# Patient Record
Sex: Male | Born: 1960 | Race: White | Hispanic: No | Marital: Married | Smoking: Former smoker
Health system: Southern US, Community
[De-identification: ages and names within clinical notes are randomized; demographics above are authoritative.]

## PROBLEM LIST (undated history)

## (undated) DIAGNOSIS — K219 Gastro-esophageal reflux disease without esophagitis: Secondary | ICD-10-CM

## (undated) DIAGNOSIS — C159 Malignant neoplasm of esophagus, unspecified: Secondary | ICD-10-CM

## (undated) DIAGNOSIS — I10 Essential (primary) hypertension: Secondary | ICD-10-CM

## (undated) DIAGNOSIS — C801 Malignant (primary) neoplasm, unspecified: Secondary | ICD-10-CM

## (undated) DIAGNOSIS — T7840XA Allergy, unspecified, initial encounter: Secondary | ICD-10-CM

## (undated) HISTORY — DX: Gastro-esophageal reflux disease without esophagitis: K21.9

## (undated) HISTORY — DX: Malignant neoplasm of esophagus, unspecified: C15.9

## (undated) HISTORY — DX: Allergy, unspecified, initial encounter: T78.40XA

## (undated) HISTORY — PX: ESOPHAGEAL DILATION: SHX303

## (undated) NOTE — *Deleted (*Deleted)
Hematology/Oncology Consult note [facility]  [phone] [phone]  Patient Care Team: Emi Belfast, FNP as PCP - General (Nurse Practitioner) Benita Gutter, RN as Oncology Nurse Navigator Creig Hines, MD as Consulting Physician (Oncology)   Name of the patient: Jacob Henson  [id number]  1961-01-26   Date of visit: @TODAY @  Diagnosis- ***  Chief complaint/ Reason for visit- ***  Heme/Onc history: ***  Interval history- ***  ECOG PS- *** Pain scale- *** Opioid associated constipation- ***  Review of systems- ROS   Current treatment- ***  No Known Allergies   Past Medical History:  Diagnosis Date  . Allergy   . Cancer (HCC)   . Esophageal cancer (HCC)   . GERD (gastroesophageal reflux disease)    Has required esophageal dilation in past  . Hypertension      Past Surgical History:  Procedure Laterality Date  . ESOPHAGEAL DILATION    . PEG TUBE PLACEMENT  11/30/2019  . PORTA CATH INSERTION N/A 11/19/2019   Procedure: PORTA CATH INSERTION;  Surgeon: Annice Needy, MD;  Location: [facility] INVASIVE CV LAB;  Service: Cardiovascular;  Laterality: N/A;    Social History   Socioeconomic History  . Marital status: Married    Spouse name: Not on file  . Number of children: Not on file  . Years of education: Not on file  . Highest education level: Not on file  Occupational History  . Occupation: full time  Tobacco Use  . Smoking status: Former Smoker    Packs/day: 1.00    Years: 4.00    Pack years: 4.00    Types: Cigarettes    Quit date: 10/14/1978    Years since quitting: 41.2  . Smokeless tobacco: Never Used  Vaping Use  . Vaping Use: Never used  Substance and Sexual Activity  . Alcohol use: No  . Drug use: Never  . Sexual activity: Not Currently  Other Topics Concern  . Not on file  Social History Narrative  . Not on file   Social Determinants of Health   Financial Resource Strain:   .  Difficulty of Paying Living Expenses: Not on file  Food Insecurity:   . Worried About Programme researcher, broadcasting/film/video in the Last Year: Not on file  . Ran Out of Food in the Last Year: Not on file  Transportation Needs:   . Lack of Transportation (Medical): Not on file  . Lack of Transportation (Non-Medical): Not on file  Physical Activity:   . Days of Exercise per Week: Not on file  . Minutes of Exercise per Session: Not on file  Stress:   . Feeling of Stress : Not on file  Social Connections:   . Frequency of Communication with Friends and Family: Not on file  . Frequency of Social Gatherings with Friends and Family: Not on file  . Attends Religious Services: Not on file  . Active Member of Clubs or Organizations: Not on file  . Attends Banker Meetings: Not on file  . Marital Status: Not on file  Intimate Partner Violence:   . Fear of Current or Ex-Partner: Not on file  . Emotionally Abused: Not on file  . Physically Abused: Not on file  . Sexually Abused: Not on file    Family History  Problem Relation Age of Onset  . Heart disease Father   . Hearing loss Father   . Diabetes Father   . Heart disease  Paternal Grandmother   . Diabetes Paternal Grandmother   . Heart attack Paternal Grandfather      Current Facility-Administered Medications:  .  acetaminophen (TYLENOL) tablet 650 mg, 650 mg, Oral, Q6H PRN **OR** acetaminophen (TYLENOL) suppository 650 mg, 650 mg, Rectal, Q6H PRN, Mansy, Jan A, MD .  chlorhexidine (PERIDEX) 0.12 % solution 15 mL, 15 mL, Mouth Rinse, BID, Arnetha Courser, MD, 15 mL at 12/20/19 2236 .  Chlorhexidine Gluconate Cloth 2 % PADS 6 each, 6 each, Topical, Daily, Noralee Stain, DO, 6 each at 12/21/19 1524 .  ciprofloxacin (CIPRO) tablet 500 mg, 500 mg, Oral, BID, Rickard Patience, MD, 500 mg at 12/21/19 2050 .  enoxaparin (LOVENOX) injection 65 mg, 65 mg, Subcutaneous, Q24H, Mansy, Jan A, MD, 65 mg at 12/21/19 1019 .  feeding supplement (OSMOLITE 1.5 CAL)  liquid 1,000 mL, 1,000 mL, Per Tube, Continuous, Noralee Stain, DO, Last Rate: 80 mL/hr at 12/20/19 1530, 1,000 mL at 12/20/19 1530 .  feeding supplement (PROSource TF) liquid 45 mL, 45 mL, Per Tube, BID, Noralee Stain, DO, 45 mL at 12/21/19 2054 .  fentaNYL (DURAGESIC) 12 MCG/HR 1 patch, 1 patch, Transdermal, Q72H, Mansy, Jan A, MD .  free water 200 mL, 200 mL, Per Tube, Q4H, Noralee Stain, DO, 200 mL at 12/21/19 2104 .  [START ON 12/22/2019] influenza vac split quadrivalent PF (FLUARIX) injection 0.5 mL, 0.5 mL, Intramuscular, Tomorrow-1000, Choi, Jennifer, DO .  lidocaine-prilocaine (EMLA) cream 1 application, 1 application, Topical, Once, Mansy, Jan A, MD .  magnesium hydroxide (MILK OF MAGNESIA) suspension 30 mL, 30 mL, Oral, Daily PRN, Mansy, Jan A, MD .  MEDLINE mouth rinse, 15 mL, Mouth Rinse, q12n4p, Amin, Sumayya, MD, 15 mL at 12/21/19 1200 .  metoCLOPramide (REGLAN) 5 MG/5ML solution 5 mg, 5 mg, Oral, TID AC, Belue, Lendon Collar, RPH, 5 mg at 12/21/19 1334 .  morphine 2 MG/ML injection 2 mg, 2 mg, Intravenous, Q4H PRN, Mansy, Jan A, MD .  OLANZapine (ZYPREXA) tablet 10 mg, 10 mg, Oral, QHS, Mansy, Jan A, MD, 10 mg at 12/21/19 2051 .  ondansetron (ZOFRAN) tablet 4 mg, 4 mg, Oral, Q6H PRN **OR** ondansetron (ZOFRAN) injection 4 mg, 4 mg, Intravenous, Q6H PRN, Mansy, Jan A, MD .  ondansetron (ZOFRAN-ODT) disintegrating tablet 8 mg, 8 mg, Oral, Q8H PRN, Mansy, Jan A, MD .  oxyCODONE (ROXICODONE) 5 MG/5ML solution 5 mg, 5 mg, Oral, Q4H PRN, Mansy, Jan A, MD, 5 mg at 12/21/19 2049 .  pantoprazole (PROTONIX) EC tablet 40 mg, 40 mg, Oral, BID, Mansy, Jan A, MD, 40 mg at 12/21/19 2050 .  sennosides (SENOKOT) 8.8 MG/5ML syrup 10 mL, 10 mL, Oral, QHS, Mansy, Jan A, MD, 10 mL at 12/20/19 2236 .  traZODone (DESYREL) tablet 25 mg, 25 mg, Oral, QHS PRN, Mansy, Jan A, MD  Physical exam:  Vitals:   12/21/19 0936 12/21/19 1231 12/21/19 1627 12/21/19 2001  BP: 121/66 (!) 144/81 117/63 131/73  Pulse: 93  90 79 (!) 108  Resp: 16 17 17 20   Temp: 97.9 F (36.6 C) 97.6 F (36.4 C) 98.4 F (36.9 C) 98 F (36.7 C)  TempSrc: Oral Oral Oral Oral  SpO2: 99% 97% 95% 93%  Weight:      Height:       Physical Exam   CMP Latest Ref Rng & Units 12/21/2019  Glucose 70 - 99 mg/dL 161(W)  BUN 6 - 20 mg/dL 96(E)  Creatinine 4.54 - 1.24 mg/dL 0.98  Sodium 119 - 147 mmol/L 133(L)  Potassium 3.5 - 5.1 mmol/L 3.7  Chloride 98 - 111 mmol/L 97(L)  CO2 22 - 32 mmol/L 27  Calcium 8.9 - 10.3 mg/dL 9.8  Total Protein 6.5 - 8.1 g/dL -  Total Bilirubin 0.3 - 1.2 mg/dL -  Alkaline Phos 38 - 956 U/L -  AST 15 - 41 U/L -  ALT 0 - 44 U/L -   CBC Latest Ref Rng & Units 12/21/2019  WBC 4.0 - 10.5 K/uL 2.5(L)  Hemoglobin 13.0 - 17.0 g/dL 2.1(H)  Hematocrit 39 - 52 % 29.8(L)  Platelets 150 - 400 K/uL 175    @IMAGES @  MR BRAIN W WO CONTRAST  Result Date: 12/20/2019 CLINICAL DATA:  Initial evaluation for intractable nausea, history of stage IV esophageal cancer. Evaluate for metastatic disease. EXAM: MRI HEAD WITHOUT AND WITH CONTRAST TECHNIQUE: Multiplanar, multiecho pulse sequences of the brain and surrounding structures were obtained without and with intravenous contrast. CONTRAST:  10mL GADAVIST GADOBUTROL 1 MMOL/ML IV SOLN COMPARISON:  None available. FINDINGS: Brain: Cerebral volume within normal limits for age. Few scattered foci of subcentimeter T2/FLAIR hyperintensity noted within the periventricular deep white matter both cerebral hemispheres, nonspecific, but most like related chronic microvascular ischemic disease, mild for age. Superimposed remote lacunar infarcts noted within the left thalamus and basal ganglia. No abnormal foci of restricted diffusion to suggest acute or subacute ischemia. Gray-white matter differentiation maintained. No encephalomalacia to suggest chronic cortical infarction elsewhere within the brain. No acute intracranial hemorrhage. Few punctate chronic micro hemorrhages noted within  the cerebellum, brainstem, and about the deep gray nuclei, most likely related to chronic underlying hypertension. No mass lesion, midline shift or mass effect. No abnormal enhancement or evidence for intracranial metastatic disease. Ventricles normal size without hydrocephalus. No extra-axial fluid collection. Pituitary gland suprasellar region within normal limits. Midline structures intact. Vascular: Major intracranial vascular flow voids are maintained. Skull and upper cervical spine: Craniocervical junction within normal limits. Bone marrow signal intensity normal. No visible focal marrow replacing lesion. No scalp soft tissue abnormality. Sinuses/Orbits: Globes orbital soft tissues within normal limits. Paranasal sinuses are largely clear. No mastoid effusion. Inner ear structures within normal limits. Other: None. IMPRESSION: 1. No acute intracranial abnormality or evidence for intracranial metastatic disease. 2. Mild chronic microvascular ischemic disease with superimposed remote lacunar infarcts involving the left thalamus and basal ganglia. 3. Few punctate chronic micro hemorrhages involving the cerebellum, brainstem, and deep gray nuclei, most likely related to chronic underlying hypertension. Electronically Signed   By: Rise Mu M.D.   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 EXAM: NUCLEAR MEDICINE PET SKULL BASE TO THIGH TECHNIQUE: 15.4 mCi F-18 FDG was injected intravenously. Full-ring PET imaging was performed from the skull base to thigh after the radiotracer. CT data was obtained and used for attenuation correction and anatomic localization. Fasting blood glucose: 121 mg/dl COMPARISON:  CT abdomen/pelvis dated 10/21/2019. MRI right shoulder dated 10/16/2019. FINDINGS: Mediastinal blood pool activity: SUV max 3.7  Liver activity: SUV max NA NECK: No hypermetabolic cervical lymphadenopathy. Incidental CT findings: none CHEST: Mass involving the lower 3rd of the esophagus (series 3/image 123), max SUV 15.0, corresponding to the patient's known primary esophageal neoplasm. 10 mm anterior left upper lobe nodule (series 3/image 85), max SUV 4.0, compatible with pulmonary metastasis. No hypermetabolic thoracic lymphadenopathy. Right chest port terminates in the lower SVC. Incidental CT  findings: Mild atherosclerotic calcifications of the aortic arch. ABDOMEN/PELVIS: 3.0 cm right adrenal metastasis (series 3/image 152), max SUV 9.0. 8 mm short axis celiac axis node (series 3/image 149), max SUV 6.4. Incidental CT findings: Layering gallbladder sludge versus noncalcified gallstones. Mild atherosclerotic calcifications of the abdominal aorta and branch vessels. SKELETON: Widespread osseous metastases throughout the visualized axial and appendicular skeleton, including: --Destructive right scapular mass, max SUV 7.7 --Right sternum, max SUV 7.4 --Right 5th costosternal junction, max SUV 7.1 --T12 vertebral body, max SUV 12.4 --Destructive left iliac bone mass, max SUV 11.4 --Right proximal femoral shaft, max SUV 5.4 Incidental CT findings: Degenerative changes of the visualized thoracolumbar spine. IMPRESSION: Distal esophageal mass, corresponding to the patient's known primary esophageal neoplasm. 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 Widespread osseous metastases, including destructive lesions in the right scapula and left iliac bone, as above. Electronically Signed   By: Charline Bills M.D.   On: 11/23/2019 11:43   DG Chest Portable 1 View  Result Date: 12/19/2019 CLINICAL DATA:  Weakness. EXAM: PORTABLE CHEST 1 VIEW COMPARISON:  October 10, 2018.  PET-CT dated 11/23/2019 FINDINGS: There is a well-positioned right-sided Port-A-Cath. Again noted is a lytic lesion involving  the right glenoid. There is no pneumothorax. No large pleural effusion. There is some atelectasis at the lung bases. There is a lytic lesion involving the posterior eighth rib on the right. The heart size is unremarkable. IMPRESSION: 1. No acute cardiopulmonary process. 2. Well-positioned right-sided Port-A-Cath. 3. Lytic lesion involving the right glenoid and right eighth rib. Electronically Signed   By: Katherine Mantle M.D.   On: 12/19/2019 02:37   DG Abd 2 Views  Result Date: 12/19/2019 CLINICAL DATA:  Generalized weakness EXAM: ABDOMEN - 2 VIEW COMPARISON:  None. FINDINGS: The bowel gas pattern is normal. Moderate amount of right colonic stool is present. There is no evidence of free air. No radio-opaque calculi or other significant radiographic abnormality is seen. IMPRESSION: Nonobstructive bowel gas pattern. Electronically Signed   By: Jonna Clark M.D.   On: 12/19/2019 16:30     Assessment and plan- Patient is a 52 y.o. male ***   Visit Diagnosis 1. Weakness   2. Neutropenia, unspecified type (HCC)   3. AKI (acute kidney injury) (HCC)   4. Nausea with vomiting      Dr. Owens Shark, MD, MPH [facility] at [facility] [phone] 12/21/2019 10:33 PM

---

## 1998-07-15 ENCOUNTER — Ambulatory Visit (HOSPITAL_COMMUNITY): Admission: RE | Admit: 1998-07-15 | Discharge: 1998-07-15 | Payer: Self-pay | Admitting: Gastroenterology

## 2003-04-11 ENCOUNTER — Encounter: Admission: RE | Admit: 2003-04-11 | Discharge: 2003-04-11 | Payer: Self-pay | Admitting: Gastroenterology

## 2003-04-11 IMAGING — RF DG ESOPHAGUS
11 of 14 series · 17 of 24 positions shown · non-contrast
Comparison: none

CLINICAL DATA: Dysphagia.
 BARIUM SWALLOW
 Initially, double contrast barium swallow was performed.  The mucosa of the esophagus appears normal.  A single contrast study was then performed.  The neuromuscular swallowing mechanism appears normal.  Esophageal peristalsis is normal. There is a sliding hiatal hernia present.  In the LPO position, there is moderate gastroesophageal reflux noted.  A barium pill was given at the end of the study which passed into the stomach without delay.
 IMPRESSION
 Small sliding hiatal hernia with free gastroesophageal reflux.   Barium pill passes into stomach without delay.

[Series 1: run · 1 of 1 slices shown (1 of 11)]
[im 1/1]
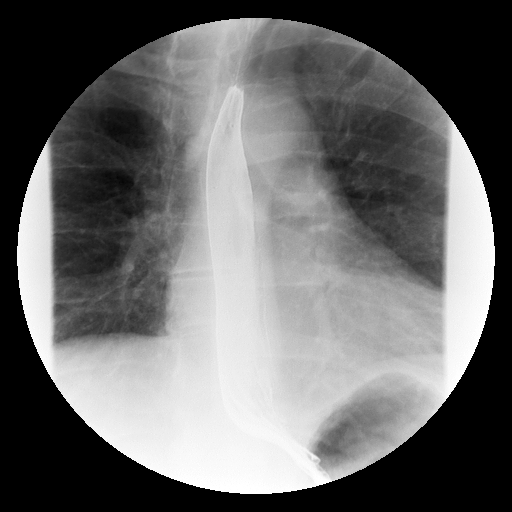

[Series 6: run · 1 of 1 slices shown (2 of 11)]
[im 1/1]
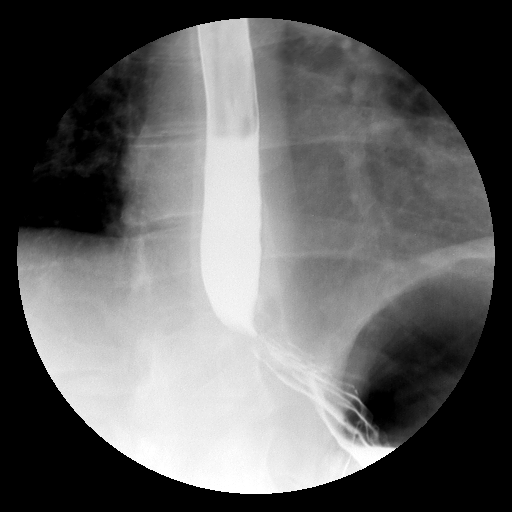

[Series 7: run · 1 of 1 slices shown (3 of 11)]
[im 1/1]
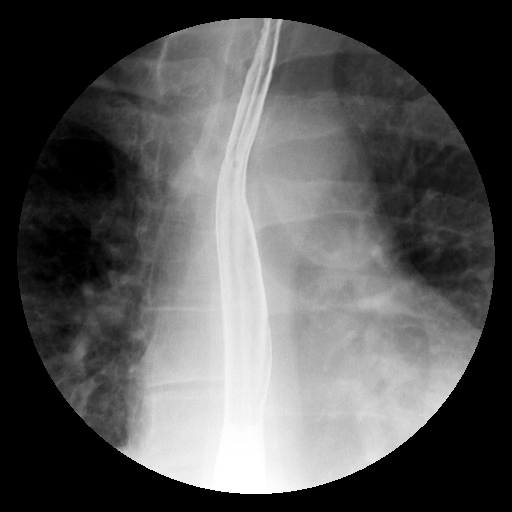

[Series 8: run · 1 of 1 slices shown (4 of 11)]
[im 1/1]
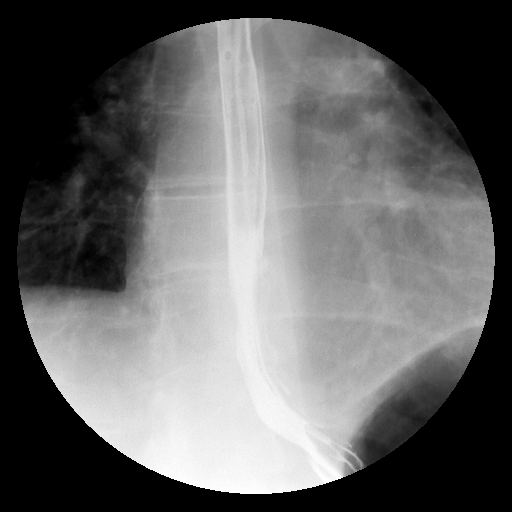

[Series 9: run · 3 of 6 slices shown (5 of 11)]
[im 2/6]
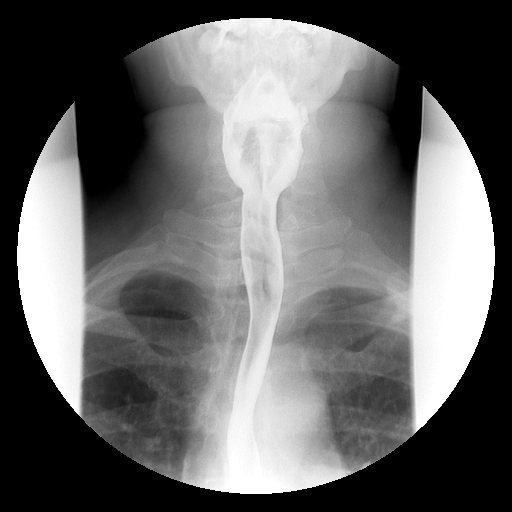
[im 3/6]
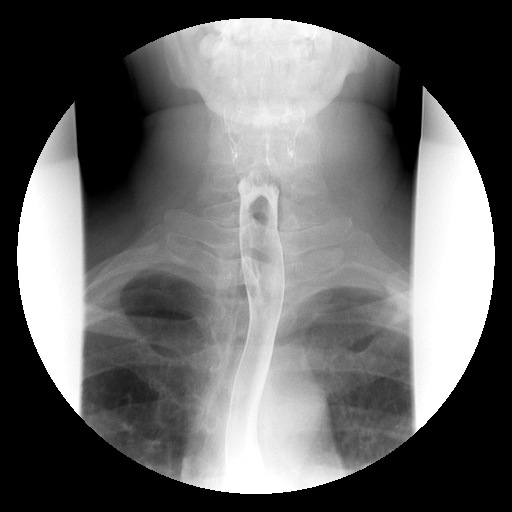
[im 6/6]
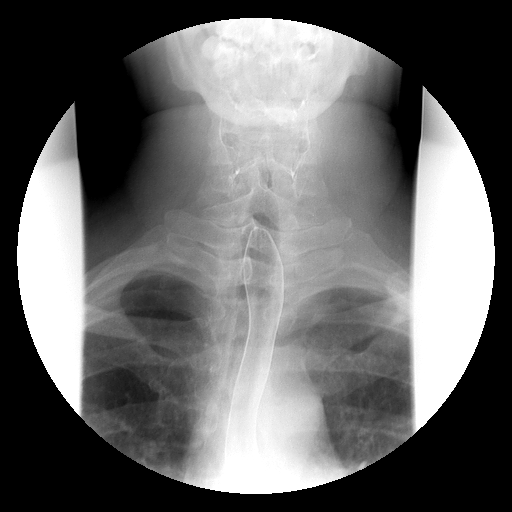

[Series 10: run · 3 of 4 slices shown (6 of 11)]
[im 1/4]
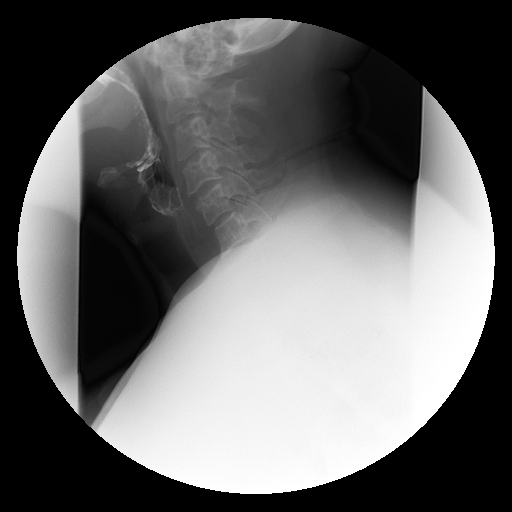
[im 3/4]
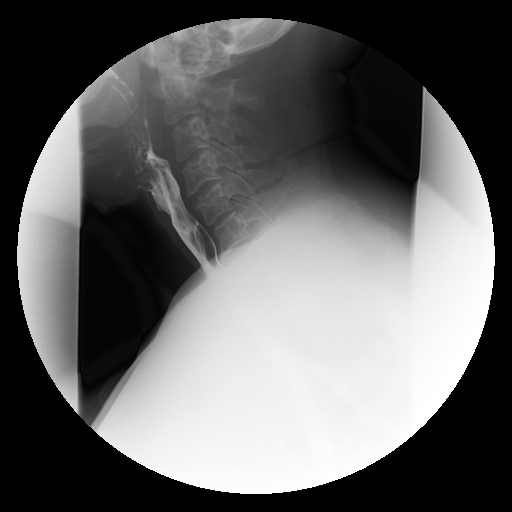
[im 4/4]
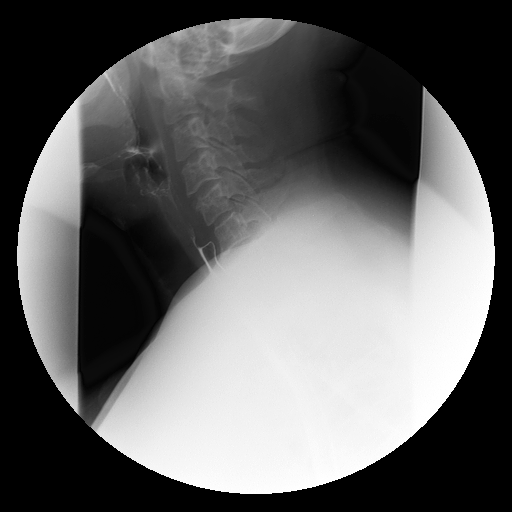

[Series 11: run · 3 of 4 slices shown (7 of 11)]
[im 1/4]
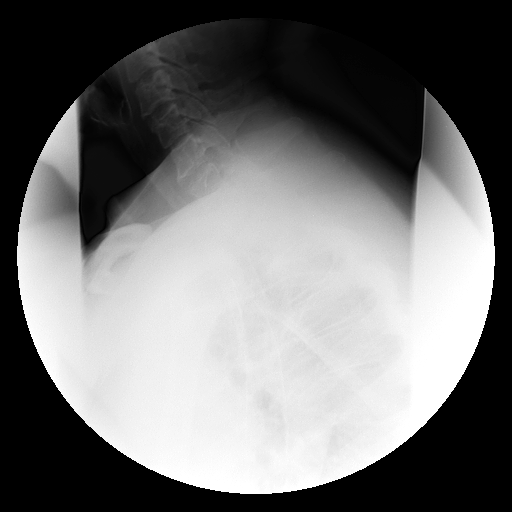
[im 3/4]
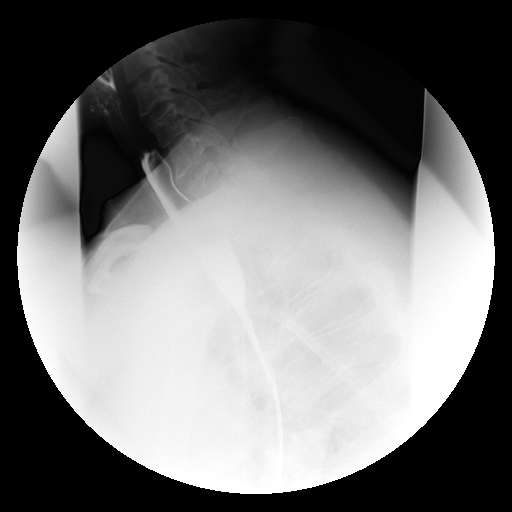
[im 4/4]
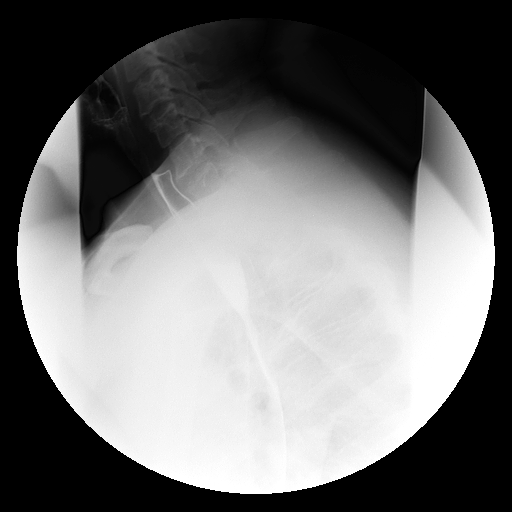

[Series 13: run · 1 of 1 slices shown (8 of 11)]
[im 1/1]
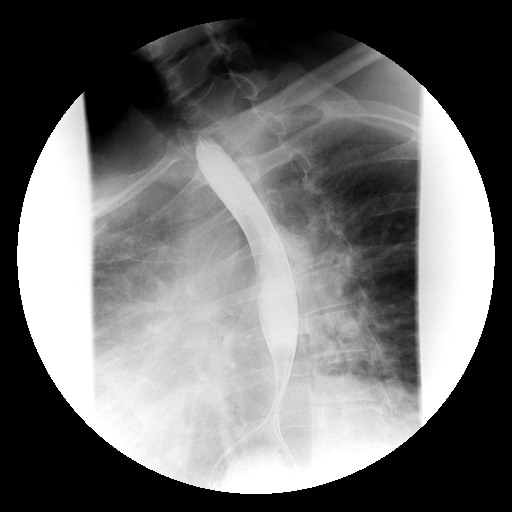

[Series 14: run · 1 of 1 slices shown (9 of 11)]
[im 1/1]
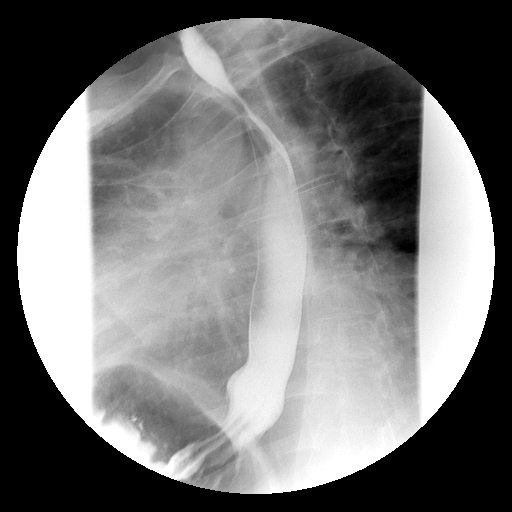

[Series 15: run · 1 of 1 slices shown (10 of 11)]
[im 1/1]
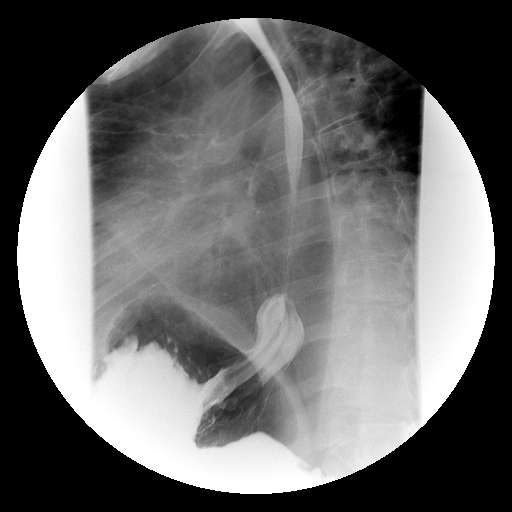

[Series 18: run · 1 of 1 slices shown (11 of 11)]
[im 1/1]
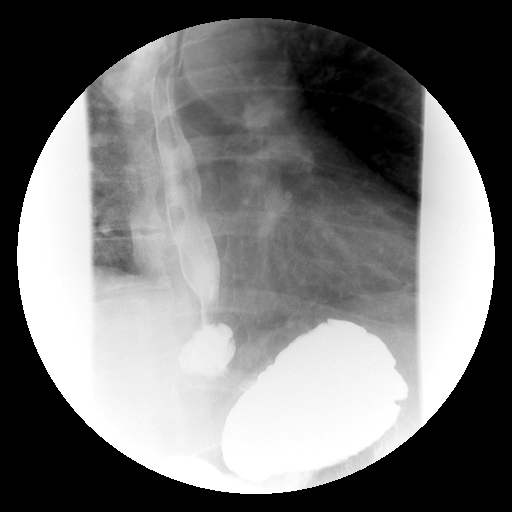

[17 of 24 positions shown; findings below may reference images not displayed]

## 2011-07-22 ENCOUNTER — Emergency Department (HOSPITAL_COMMUNITY)
Admission: EM | Admit: 2011-07-22 | Discharge: 2011-07-22 | Disposition: A | Discharge status: Home Health | Payer: 59 | Attending: Emergency Medicine | Admitting: Emergency Medicine

## 2011-07-22 ENCOUNTER — Encounter (HOSPITAL_COMMUNITY): Payer: Self-pay | Admitting: Emergency Medicine

## 2011-07-22 DIAGNOSIS — R51 Headache: Secondary | ICD-10-CM | POA: Insufficient documentation

## 2011-07-22 DIAGNOSIS — I10 Essential (primary) hypertension: Secondary | ICD-10-CM | POA: Insufficient documentation

## 2011-07-22 LAB — BASIC METABOLIC PANEL
BUN: 13 mg/dL (ref 6–23)
CO2: 25 mEq/L (ref 19–32)
Calcium: 9.3 mg/dL (ref 8.4–10.5)
Chloride: 102 mEq/L (ref 96–112)
Creatinine, Ser: 1.1 mg/dL (ref 0.50–1.35)
GFR calc Af Amer: 88 mL/min — ABNORMAL LOW (ref >90)
GFR calc non Af Amer: 76 mL/min — ABNORMAL LOW (ref >90)
Glucose, Bld: 105 mg/dL — ABNORMAL HIGH (ref 70–99)
Potassium: 4.8 mEq/L (ref 3.5–5.1)
Sodium: 137 mEq/L (ref 135–145)

## 2011-07-22 LAB — DIFFERENTIAL
Basophils Absolute: 0 10*3/uL (ref 0.0–0.1)
Basophils Relative: 0 % (ref 0–1)
Eosinophils Absolute: 0.4 10*3/uL (ref 0.0–0.7)
Eosinophils Relative: 4 % (ref 0–5)
Lymphocytes Relative: 20 % (ref 12–46)
Lymphs Abs: 2 10*3/uL (ref 0.7–4.0)
Monocytes Absolute: 1 10*3/uL (ref 0.1–1.0)
Monocytes Relative: 10 % (ref 3–12)
Neutro Abs: 6.9 10*3/uL (ref 1.7–7.7)
Neutrophils Relative %: 67 % (ref 43–77)

## 2011-07-22 LAB — CBC
HCT: 47.3 % (ref 39.0–52.0)
Hemoglobin: 16.3 g/dL (ref 13.0–17.0)
MCH: 32.3 pg (ref 26.0–34.0)
MCHC: 34.5 g/dL (ref 30.0–36.0)
MCV: 93.8 fL (ref 78.0–100.0)
Platelets: 149 10*3/uL — ABNORMAL LOW (ref 150–400)
RBC: 5.04 MIL/uL (ref 4.22–5.81)
RDW: 13.1 % (ref 11.5–15.5)
WBC: 10.3 10*3/uL (ref 4.0–10.5)

## 2011-07-22 LAB — TROPONIN I
Troponin I: <0.3 ng/mL (ref <0.30)
Troponin I: <0.3 ng/mL (ref <0.30)

## 2011-07-22 MED ORDER — IBUPROFEN 800 MG PO TABS
800.0000 mg | ORAL_TABLET | Freq: Once | ORAL | Status: AC
  Administered 2011-07-22: 800 mg via ORAL
  Filled 2011-07-22: qty 1

## 2011-07-22 MED ORDER — HYDROCHLOROTHIAZIDE 25 MG PO TABS: 25.0000 mg | ORAL_TABLET | Freq: Every day | ORAL | Status: DC

## 2011-07-22 MED ORDER — ASPIRIN 81 MG PO CHEW
324.0000 mg | CHEWABLE_TABLET | Freq: Once | ORAL | Status: AC
  Administered 2011-07-22: 324 mg via ORAL
  Filled 2011-07-22: qty 4

## 2011-07-22 MED ORDER — GI COCKTAIL ~~LOC~~
30.0000 mL | Freq: Once | ORAL | Status: AC
  Administered 2011-07-22: 30 mL via ORAL
  Filled 2011-07-22: qty 30

## 2011-07-22 NOTE — Discharge Instructions (Signed)
You were seen and evaluated today for your symptoms of elevated blood pressure. Your EKG of your heart was abnormal but did not show any signs for heart attack or other concerning condition.Your lab testing today was normal without any signs for concerning cause of your blood pressure. There were also no signs for heart damage or heart attack on your lab testing today. At this time your providers recommend use of blood pressure medication for your blood pressure. Continue to monitor your blood pressure daily at the same time during the day. Please followup with a primary care provider for continued evaluation and treatment of your medical conditions elevated blood pressure.   Hypertension Information As your heart beats, it forces blood through your arteries. This force is your blood pressure. If the pressure is too high, it is called hypertension (HTN) or high blood pressure. HTN is dangerous because you may have it and not know it. High blood pressure may mean that your heart has to work harder to pump blood. Your arteries may be narrow or stiff. The extra work puts you at risk for heart disease, stroke, and other problems.  Blood pressure consists of two numbers, a higher number over a lower, 110/72, for example. It is stated as "110 over 72." The ideal is below 120 for the top number (systolic) and under 80 for the bottom (diastolic).  You should pay close attention to your blood pressure if you have certain conditions such as:  Heart failure.   Prior heart attack.   Diabetes   Chronic kidney disease.   Prior stroke.   Multiple risk factors for heart disease.  To see if you have HTN, your blood pressure should be measured while you are seated with your arm held at the level of the heart. It should be measured at least twice. A one-time elevated blood pressure reading (especially in the Emergency Department) does not mean that you need treatment. There may be conditions in which the blood  pressure is different between your right and left arms. It is important to see your caregiver soon for a recheck. Most people have essential hypertension which means that there is not a specific cause. This type of high blood pressure may be lowered by changing lifestyle factors such as:  Stress.   Smoking.   Lack of exercise.   Excessive weight.   Drug/tobacco/alcohol use.   Eating less salt.  Most people do not have symptoms from high blood pressure until it has caused damage to the body. Effective treatment can often prevent, delay or reduce that damage. TREATMENT  Treatment for high blood pressure, when a cause has been identified, is directed at the cause. There are a large number of medications to treat HTN. These fall into several categories, and your caregiver will help you select the medicines that are best for you. Medications may have side effects. You should review side effects with your caregiver. If your blood pressure stays high after you have made lifestyle changes or started on medicines,   Your medication(s) may need to be changed.   Other problems may need to be addressed.   Be certain you understand your prescriptions, and know how and when to take your medicine.   Be sure to follow up with your caregiver within the time frame advised (usually within two weeks) to have your blood pressure rechecked and to review your medications.   If you are taking more than one medicine to lower your blood pressure, make sure  you know how and at what times they should be taken. Taking two medicines at the same time can result in blood pressure that is too low.  Document Released: 04/02/2005 Document Revised: 10/10/2010 Document Reviewed: 04/09/2007 [facility] Patient Information 2012 [place], [place].    RESOURCE GUIDE  Chronic Pain Problems: Contact [facility]  [phone] Patients need to be referred by their primary care doctor.  Insufficient Money for  Medicine: Contact United Way:  call "211" or [facility] [phone].  No Primary Care Doctor: - Call [facility]  [phone] - can help you locate a primary care doctor that  accepts your insurance, provides certain services, etc. - Physician Referral Service- [phone]  Agencies that provide inexpensive medical care: - [facility]  [phone] - [facility]  [phone] - [facility]  [phone] - Women's Clinic  [phone] - [facility]  [phone] [facility]  [phone]  Medicaid-accepting [facility] Providers: - [facility]- [address]  [phone], Mon-Fri 9am-7pm, Sat 9am-1pm - [facility]- [address] [place], [address] [place]  [phone] - [facility]- [address], [address] [place]  [phone] [facility]- [address]  [phone] - Renaye Rakers- [address] [place], [address], [phone]  Only accepts [place] patients after they have their name  applied to their card  Self Pay (no insurance) in [place]: - Sickle Cell Patients: Dr Willey Blade, [facility]  [address] [place], [phone] - [facility]- [address] [place]  [phone]       [facility]- [address]       -     [facility]- see information above (Speak to [employer] if you do not have insurance)       -  [facility]- [address] [place], [phone]       -  [facility]- [address],  [phone]       -  [facility]- [address], [phone]       -  Dr Julio Sicks-  [address], [place], [phone]       -  [facility]- [address] [phone]       -  [facility]- [address], [phone], also [address], [phone]       -    [facility]- [address]  [place], [phone], 1st & 3rd Saturday   every month, 10am-1pm  1) Find a Doctor and Pay Out of Pocket Although you won't have to find out who is covered by your insurance plan, it is a good idea to ask around and get recommendations. You will then need to call the office and see if the doctor you have chosen will accept you as a new patient and what types of options they offer for patients who are self-pay. Some doctors offer discounts or will set up payment plans for their patients who do not have insurance, but you will need to ask so you aren't surprised when you get to your appointment.  2) Contact Your Local Health Department Not all health departments have doctors that can see patients for sick visits, but many do, so  it is worth a call to see if yours does. If you don't know where your local health department is, you can check in your phone book. The CDC also has a tool to help you locate your state's health department, and many state websites also have listings of all of their local health departments.  3) Find a Walk-in Clinic If your illness is not likely to be very severe or complicated, you may want to try a walk in clinic. These are popping up all over the country in pharmacies, drugstores, and shopping centers. They're usually staffed by nurse practitioners or physician assistants that have been trained to treat common illnesses and complaints. They're usually fairly quick and inexpensive. However, if you have serious medical issues or chronic medical problems, these are probably not your best option  STD Testing - [facility] Department of [facility], STD Clinic, [address] [place], phone [phone] or [phone].  Monday - Friday, call for an appointment. [facility] Department of [employer], STD Clinic, [address], [place], phone [phone] or [phone].  Monday - Friday, call for an appointment.  Abuse/Neglect: [facility] Child Abuse Hotline [phone] [facility] Child Abuse Hotline 2392200810 (After Hours)  Emergency Shelter:  [facility] ([phone]  Maternity Homes: - [facility] [place] [facility] [phone] - [facility] [phone]  MRSA Hotline #:   2690938092  [facility] Resources  [facility] [place]  [employer] [facility] Dept. [address]                 [address]         [address] [place] [address]  Blondell Reveal Phone:  [phone]                                  Phone:  [phone]                   Phone:  [phone]  [facility] Mental [facility], [phone] - [phone]       -     [facility] in [place], [address],                                  [phone], [facility] Child Abuse Hotline 413-530-1188 or 9297324105 (After Hours)   Behavioral Health Services  Substance Abuse Resources: - [facility]  ([phone] - [facility] [phone] - [facility] [place] [phone] Floydene Flock ([phone] - Residential & Outpatient Substance Abuse Program  [phone]  Psychological Services: [facility]  ([phone] Services  [phone] - [facility], [phone] [place]. [address], [place], ACCESS LINE: 320-851-8470 or 346-043-4979, [employer]  Dental Assistance  If unable to pay or uninsured, contact:  [facility] or [facility]. to become qualified for the adult dental clinic.  Patients with Medicaid: [facility] [phone] [address], [phone], [phone]  If unable to pay, or uninsured, contact [facility]  [phone]) or [facility] Department [phone] in [place], [phone] in [facility]) to become qualified for the adult dental clinic  Other Low-Cost Community Dental Services: - [facility]- [address] [place], [place], [place], [address], [phone], [phone], 2nd and 4th Thursday of the month at 6:30am.  10 clients each day by appointment, can sometimes see walk-in patients if someone does not show for an appointment. [facility]- [address] [place]ins [place], [place], [address], [phone] - [facility]- [address] [place], [place], [address], [phone] - [facility]- [phone] [facility] Health Department- [phone] [facility] Department- [phone]

## 2011-07-22 NOTE — ED Notes (Signed)
Patient sts that since Friday of last week he has been experiencing headaches and indigestion. Patients wife checked blood pressure with wrist cuff over weekend and sts that his highest bp was 177/117 and that his headache was worse when his blood pressure was up. Patient currently experiencing headache in back of his head pain 6/10. Patient on cardiac monitor, blood being drawn at this time by Dierdre, phlebotomist. PA Dammen has been in to see patient and EKG given to MD otter.

## 2011-07-22 NOTE — ED Notes (Signed)
Pt alert, nad, arrives from home, c/o high blood pressure, onset several days ago, states mild fullness in head, denies chest pain, resp even unlabored, skin pwd

## 2011-07-22 NOTE — ED Provider Notes (Signed)
Medical screening examination/treatment/procedure(s) were performed by non-physician practitioner and as supervising physician I was immediately available for consultation/collaboration.  Mendi Constable M Ozie Dimaria, MD 07/22/11 0620 

## 2011-07-22 NOTE — ED Provider Notes (Signed)
History     CSN: [id number]  Arrival date & time 07/22/11  0402   First MD Initiated Contact with Patient 07/22/11 0414      Chief Complaint  Patient presents with  . Hypertension    HPI  History provided by the patient and family. Patient is a 51 year old male with history of hiatal hernia who presents with complaints for concerns of elevated blood pressure. Patient then reports at a pressure has been elevated over last several days. Patient has been checking it regularly at home with a wrist blood pressure cuff. Patient noticed highest reading Friday night of 170/98. Patient states his pressure has been fluctuating over the past few days but has been saying elevated. Patient also reports having some mild headaches off and on for the past few days. Patient denies any aggravating or alleviating factors. Patient woke up this morning around 2 AM and reports that he felt some indigestion in his chest and stomach. Patient states this felt different than his acid reflux. It is described as a "greasy feeling" with some associated belching. Patient denies any other symptoms.    History reviewed. No pertinent past medical history.  History reviewed. No pertinent past surgical history.  No family history on file.  History  Substance Use Topics  . Smoking status: Never Smoker   . Smokeless tobacco: Not on file  . Alcohol Use: No      Review of Systems  Constitutional: Negative for fever, chills and diaphoresis.  Respiratory: Negative for shortness of breath.   Cardiovascular: Negative for chest pain, palpitations and leg swelling.  Gastrointestinal: Negative for nausea, vomiting and abdominal pain.  Genitourinary: Negative for difficulty urinating.  Neurological: Positive for headaches.    Allergies  Review of patient's allergies indicates no known allergies.  Home Medications   Current Outpatient Rx  Name Route Sig Dispense Refill  . RANITIDINE HCL 150 MG PO TABS Oral Take  150 mg by mouth 2 (two) times daily.      BP 166/97  Pulse 86  Temp 98 F (36.7 C)  Resp 16  Wt 290 lb (131.543 kg)  SpO2 99%  Physical Exam  Nursing note and vitals reviewed. Constitutional: He is oriented to person, place, and time. He appears well-developed and well-nourished. No distress.  HENT:  Head: Normocephalic.  Eyes: Conjunctivae and EOM are normal. Pupils are equal, round, and reactive to light.  Cardiovascular: Normal rate and regular rhythm.   No murmur heard. Pulmonary/Chest: Effort normal and breath sounds normal. He has no rales. He exhibits no tenderness.  Abdominal: Soft. There is tenderness in the epigastric area. There is no rebound and no guarding.       Mild pain  Musculoskeletal: Normal range of motion. He exhibits no edema and no tenderness.  Neurological: He is alert and oriented to person, place, and time. He has normal strength. No cranial nerve deficit or sensory deficit. Gait normal.  Skin: Skin is warm.  Psychiatric: He has a normal mood and affect. His behavior is normal.    ED Course  Procedures   Results for orders placed during the hospital encounter of 07/22/11  CBC      Component Value Range   WBC 10.3  4.0 - 10.5 (K/uL)   RBC 5.04  4.22 - 5.81 (MIL/uL)   Hemoglobin 16.3  13.0 - 17.0 (g/dL)   HCT 78.2  95.6 - 21.3 (%)   MCV 93.8  78.0 - 100.0 (fL)   MCH 32.3  26.0 -  34.0 (pg)   MCHC 34.5  30.0 - 36.0 (g/dL)   RDW 09.8  11.9 - 14.7 (%)   Platelets 149 (*) 150 - 400 (K/uL)  DIFFERENTIAL      Component Value Range   Neutrophils Relative 67  43 - 77 (%)   Neutro Abs 6.9  1.7 - 7.7 (K/uL)   Lymphocytes Relative 20  12 - 46 (%)   Lymphs Abs 2.0  0.7 - 4.0 (K/uL)   Monocytes Relative 10  3 - 12 (%)   Monocytes Absolute 1.0  0.1 - 1.0 (K/uL)   Eosinophils Relative 4  0 - 5 (%)   Eosinophils Absolute 0.4  0.0 - 0.7 (K/uL)   Basophils Relative 0  0 - 1 (%)   Basophils Absolute 0.0  0.0 - 0.1 (K/uL)  BASIC METABOLIC PANEL       Component Value Range   Sodium 137  135 - 145 (mEq/L)   Potassium 4.8  3.5 - 5.1 (mEq/L)   Chloride 102  96 - 112 (mEq/L)   CO2 25  19 - 32 (mEq/L)   Glucose, Bld 105 (*) 70 - 99 (mg/dL)   BUN 13  6 - 23 (mg/dL)   Creatinine, Ser 8.29  0.50 - 1.35 (mg/dL)   Calcium 9.3  8.4 - 56.2 (mg/dL)   GFR calc non Af Amer 76 (*) >90 (mL/min)   GFR calc Af Amer 88 (*) >90 (mL/min)  TROPONIN I      Component Value Range   Troponin I <0.30  <0.30 (ng/mL)        1. Hypertension       MDM  4:25 AM patient seen and evaluated. Patient no acute distress.   5:30AM patient feeling much better at this time. He denies any indigestion sensations. Still reports slight posterior headache. Will give ibuprofen.   Pt discussed with Attending Physician.  Will get second troponin.  If normal plan d/c home with HCTZ and PCP referral.  Pt discussed in sign out with Thomasene Lot PA-C.  She will follow second troponin.   Date: 07/22/2011  Rate: 84  Rhythm: normal sinus rhythm  QRS Axis: normal  Intervals: normal  ST/T Wave abnormalities: nonspecific ST/T changes  Conduction Disutrbances:right bundle branch block  Narrative Interpretation:   Old EKG Reviewed: none available       Angus Seller, [place] 07/22/11 [phone]

## 2018-10-10 ENCOUNTER — Emergency Department (HOSPITAL_BASED_OUTPATIENT_CLINIC_OR_DEPARTMENT_OTHER): Payer: 59

## 2018-10-10 ENCOUNTER — Encounter (HOSPITAL_COMMUNITY): Payer: Self-pay | Admitting: Emergency Medicine

## 2018-10-10 ENCOUNTER — Emergency Department (HOSPITAL_COMMUNITY)
Admission: EM | Admit: 2018-10-10 | Discharge: 2018-10-10 | Disposition: A | Discharge status: Home / Self Care | Payer: 59 | Attending: Emergency Medicine | Admitting: Emergency Medicine

## 2018-10-10 ENCOUNTER — Emergency Department (HOSPITAL_COMMUNITY): Payer: 59

## 2018-10-10 ENCOUNTER — Other Ambulatory Visit: Payer: Self-pay

## 2018-10-10 DIAGNOSIS — R05 Cough: Secondary | ICD-10-CM | POA: Diagnosis not present

## 2018-10-10 DIAGNOSIS — Y929 Unspecified place or not applicable: Secondary | ICD-10-CM | POA: Diagnosis not present

## 2018-10-10 DIAGNOSIS — I10 Essential (primary) hypertension: Secondary | ICD-10-CM | POA: Diagnosis not present

## 2018-10-10 DIAGNOSIS — L03115 Cellulitis of right lower limb: Secondary | ICD-10-CM | POA: Diagnosis not present

## 2018-10-10 DIAGNOSIS — S80811A Abrasion, right lower leg, initial encounter: Secondary | ICD-10-CM | POA: Insufficient documentation

## 2018-10-10 DIAGNOSIS — Y999 Unspecified external cause status: Secondary | ICD-10-CM | POA: Diagnosis not present

## 2018-10-10 DIAGNOSIS — W208XXA Other cause of strike by thrown, projected or falling object, initial encounter: Secondary | ICD-10-CM | POA: Insufficient documentation

## 2018-10-10 DIAGNOSIS — Y939 Activity, unspecified: Secondary | ICD-10-CM | POA: Insufficient documentation

## 2018-10-10 DIAGNOSIS — M7989 Other specified soft tissue disorders: Secondary | ICD-10-CM

## 2018-10-10 HISTORY — DX: Essential (primary) hypertension: I10

## 2018-10-10 LAB — COMPREHENSIVE METABOLIC PANEL
ALT: 31 U/L (ref 0–44)
AST: 25 U/L (ref 15–41)
Albumin: 3.8 g/dL (ref 3.5–5.0)
Alkaline Phosphatase: 90 U/L (ref 38–126)
Anion gap: 12 (ref 5–15)
BUN: 10 mg/dL (ref 6–20)
CO2: 27 mmol/L (ref 22–32)
Calcium: 8.8 mg/dL — ABNORMAL LOW (ref 8.9–10.3)
Chloride: 99 mmol/L (ref 98–111)
Creatinine, Ser: 1.33 mg/dL — ABNORMAL HIGH (ref 0.61–1.24)
GFR calc Af Amer: >60 mL/min (ref >60)
GFR calc non Af Amer: 59 mL/min — ABNORMAL LOW (ref >60)
Glucose, Bld: 104 mg/dL — ABNORMAL HIGH (ref 70–99)
Potassium: 3.8 mmol/L (ref 3.5–5.1)
Sodium: 138 mmol/L (ref 135–145)
Total Bilirubin: 0.9 mg/dL (ref 0.3–1.2)
Total Protein: 7.5 g/dL (ref 6.5–8.1)

## 2018-10-10 LAB — CBC WITH DIFFERENTIAL/PLATELET
Abs Immature Granulocytes: 0.02 10*3/uL (ref 0.00–0.07)
Basophils Absolute: 0.1 10*3/uL (ref 0.0–0.1)
Basophils Relative: 1 %
Eosinophils Absolute: 0.1 10*3/uL (ref 0.0–0.5)
Eosinophils Relative: 2 %
HCT: 49.8 % (ref 39.0–52.0)
Hemoglobin: 16.8 g/dL (ref 13.0–17.0)
Immature Granulocytes: 0 %
Lymphocytes Relative: 17 %
Lymphs Abs: 1.1 10*3/uL (ref 0.7–4.0)
MCH: 31.2 pg (ref 26.0–34.0)
MCHC: 33.7 g/dL (ref 30.0–36.0)
MCV: 92.6 fL (ref 80.0–100.0)
Monocytes Absolute: 0.6 10*3/uL (ref 0.1–1.0)
Monocytes Relative: 10 %
Neutro Abs: 4.6 10*3/uL (ref 1.7–7.7)
Neutrophils Relative %: 70 %
Platelets: 161 10*3/uL (ref 150–400)
RBC: 5.38 MIL/uL (ref 4.22–5.81)
RDW: 13.3 % (ref 11.5–15.5)
WBC: 6.5 10*3/uL (ref 4.0–10.5)
nRBC: 0 % (ref 0.0–0.2)

## 2018-10-10 LAB — LACTIC ACID, PLASMA
Lactic Acid, Venous: 1.1 mmol/L (ref 0.5–1.9)
Lactic Acid, Venous: 0.8 mmol/L (ref 0.5–1.9)

## 2018-10-10 LAB — TROPONIN I (HIGH SENSITIVITY)
Troponin I (High Sensitivity): 26 ng/L — ABNORMAL HIGH (ref <18)
Troponin I (High Sensitivity): 26 ng/L — ABNORMAL HIGH (ref <18)

## 2018-10-10 IMAGING — DX CHEST - 2 VIEW
2 series · 2 of 2 positions shown · non-contrast
Comparison: None.

CLINICAL DATA: Leg pain.  Elevated blood pressure.

EXAM:
CHEST - 2 VIEW

[chest pa]
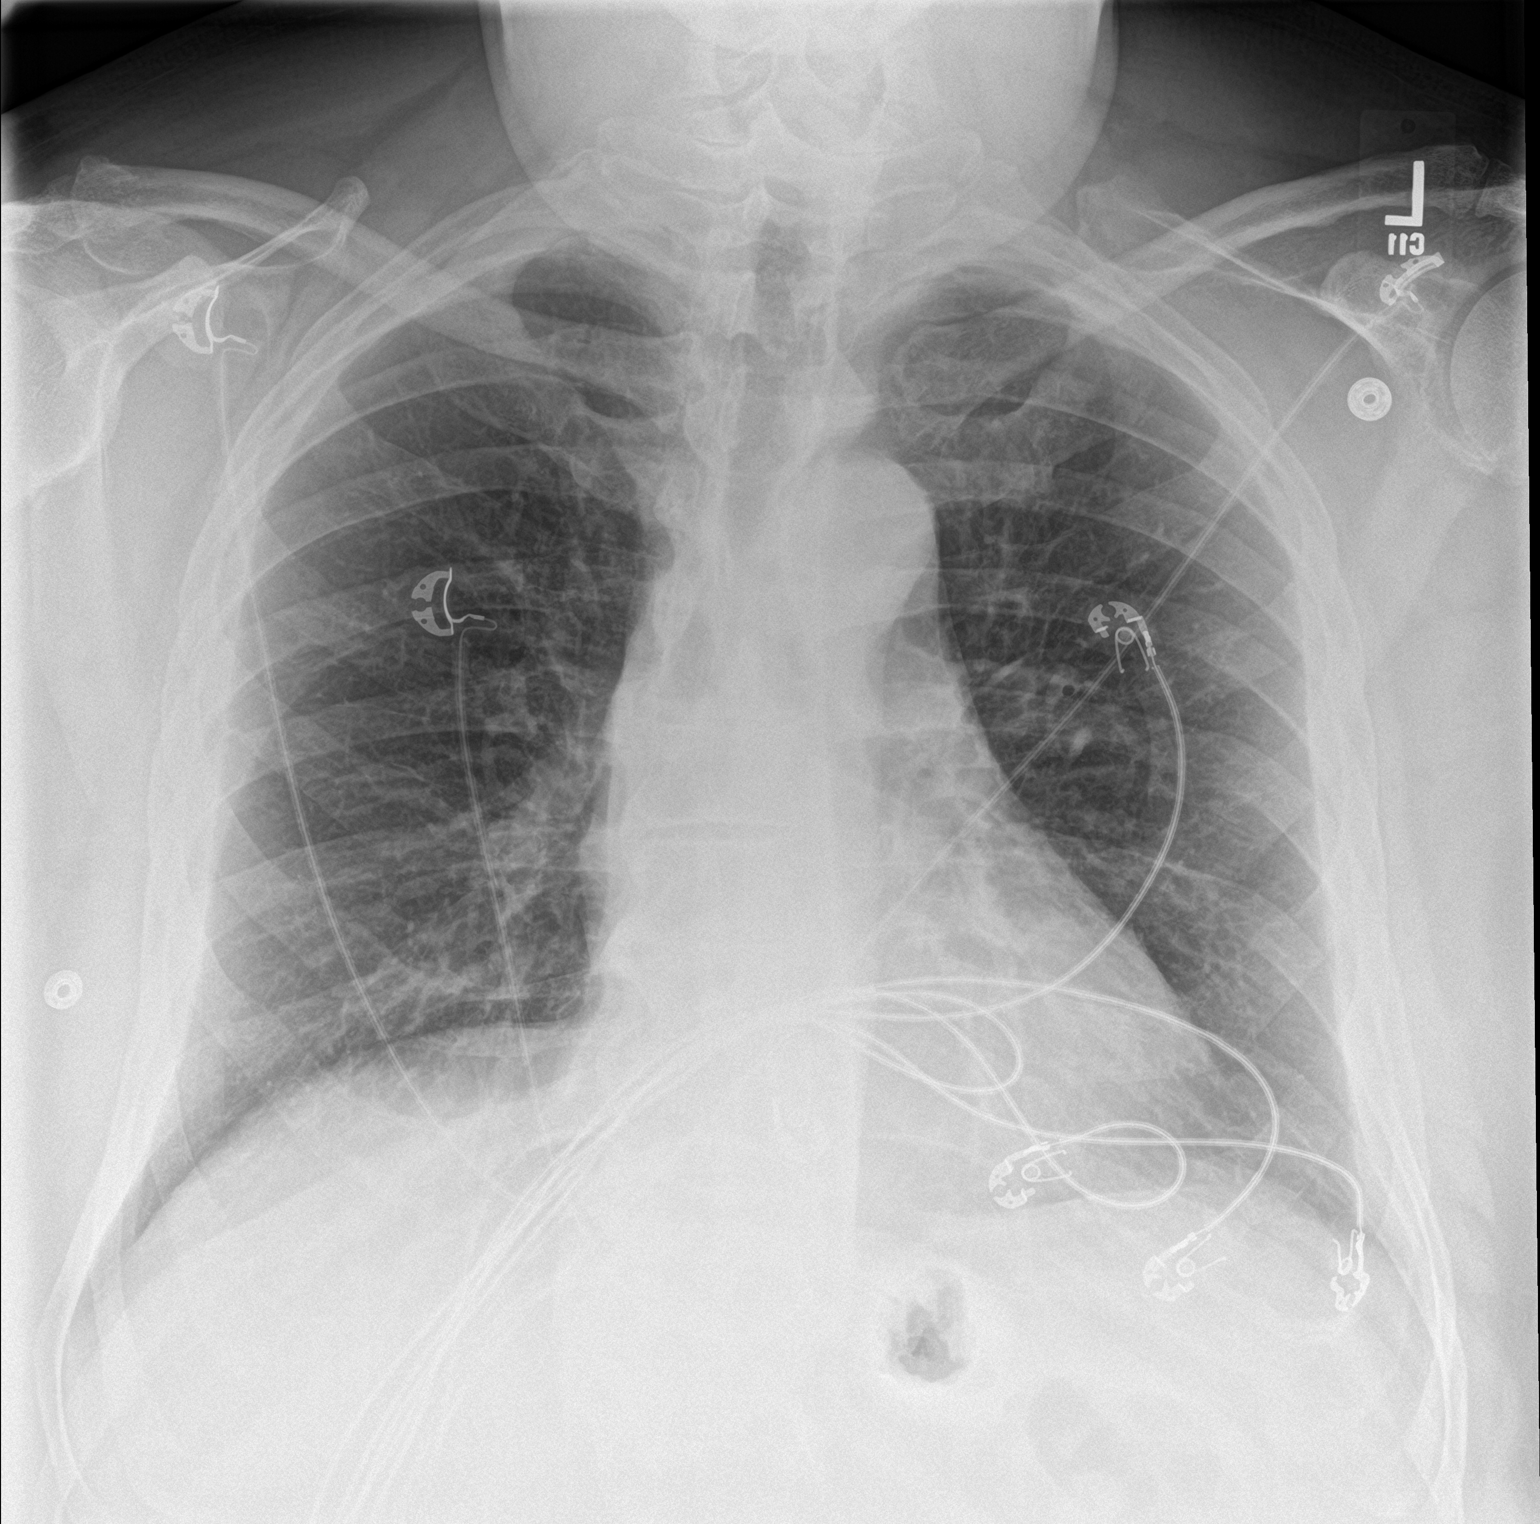

[chest lat]
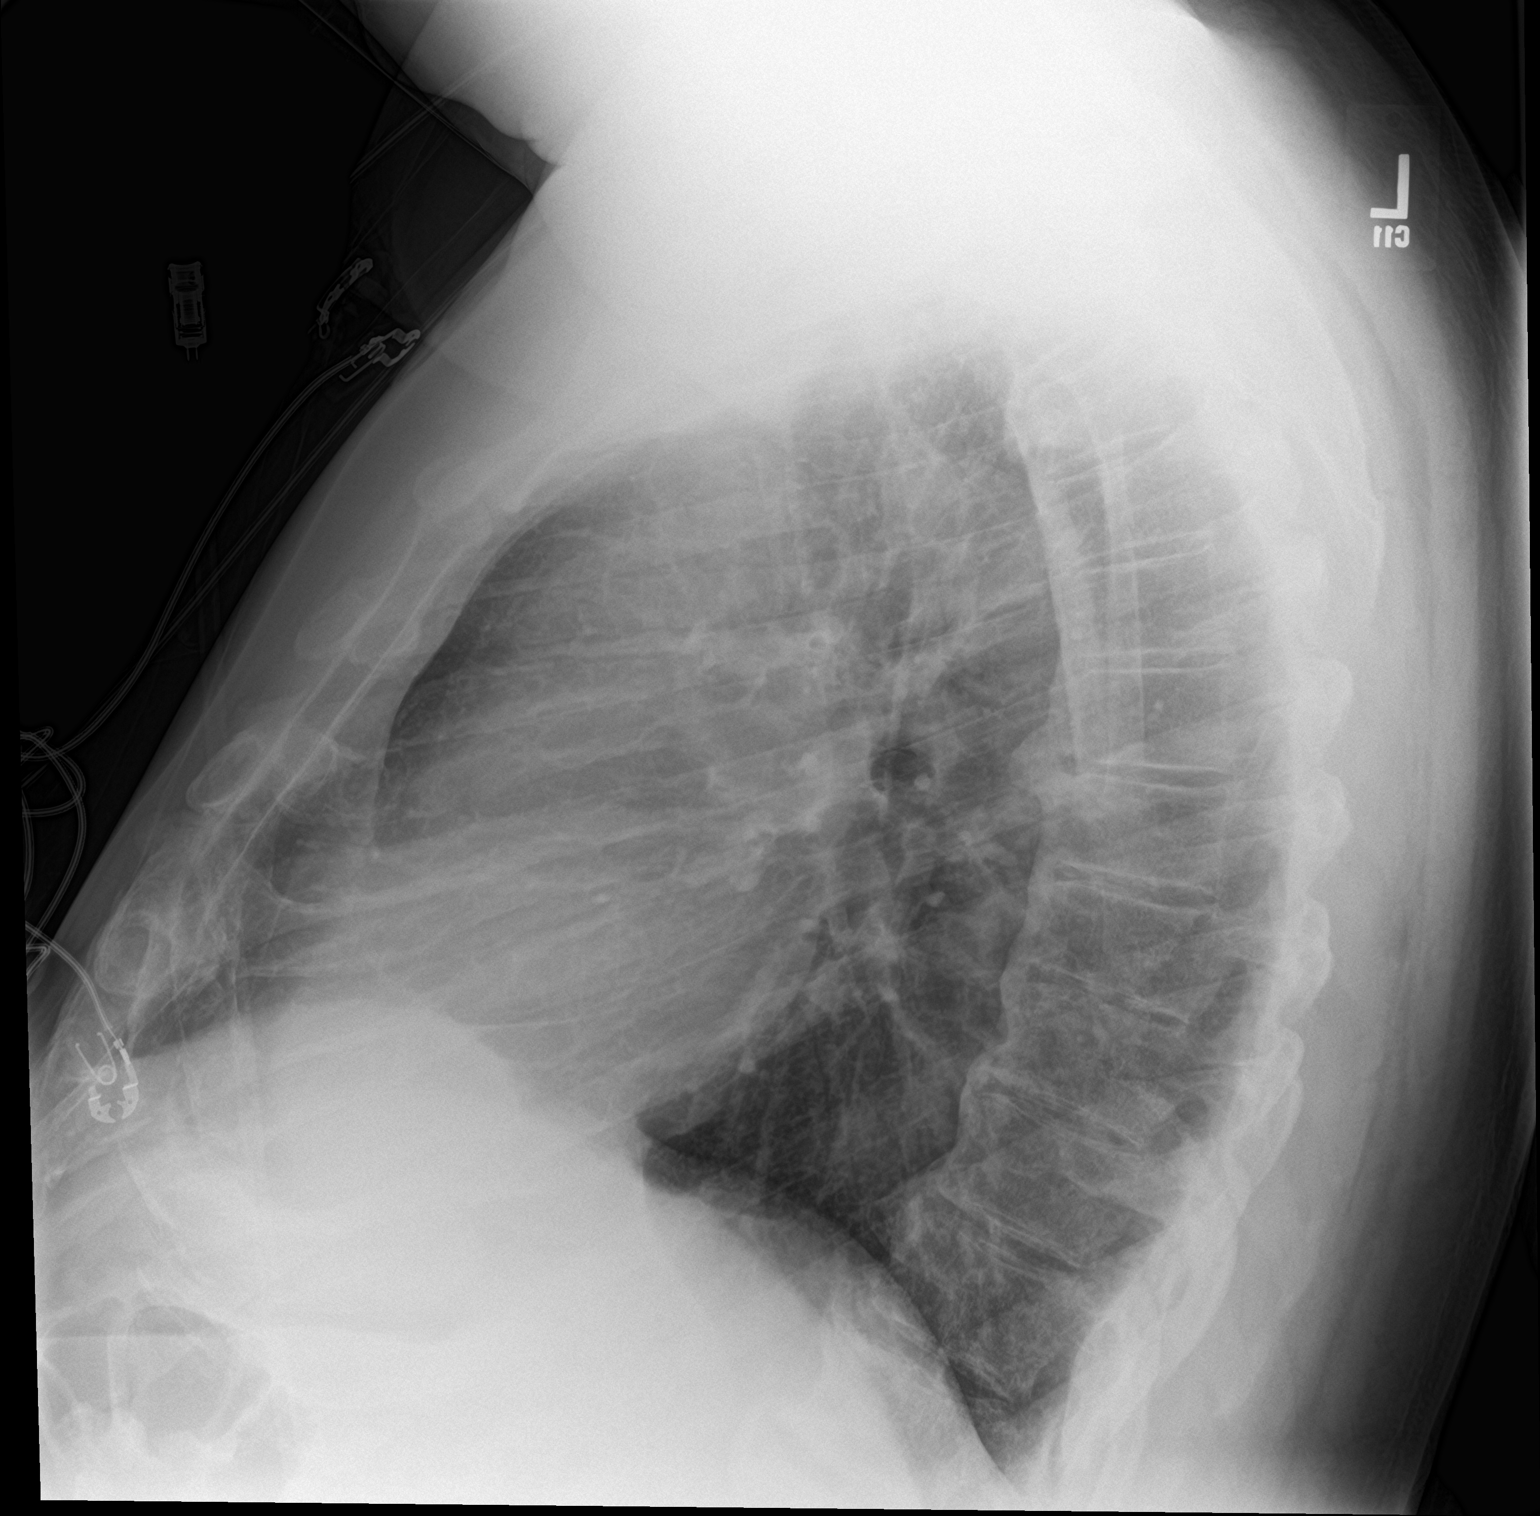

[2 of 2 positions shown; findings below may reference images not displayed]

FINDINGS: The heart size and mediastinal contours are within normal limits.
Both lungs are clear. The visualized skeletal structures are
unremarkable.
IMPRESSION: No active cardiopulmonary disease.

## 2018-10-10 MED ORDER — DOXYCYCLINE HYCLATE 100 MG PO CAPS: 100.0000 mg | ORAL_CAPSULE | Freq: Two times a day (BID) | ORAL | 0 refills | Status: AC

## 2018-10-10 MED ORDER — HYDROCHLOROTHIAZIDE 25 MG PO TABS: 25.0000 mg | ORAL_TABLET | Freq: Every day | ORAL | 0 refills | Status: DC

## 2018-10-10 NOTE — ED Notes (Signed)
Patient verbalizes understanding of discharge instructions. Opportunity for questioning and answers were provided. pt discharged from ED with wife. Ambulatory by self   

## 2018-10-10 NOTE — Discharge Instructions (Signed)
Your work-up today was overall reassuring.  We did document elevated blood pressures, please start the blood pressure medication you have used in the past.  Please follow-up with a PCP to discuss further blood pressure management.  Please also take the antibiotics for the cellulitis of the leg.  We do not see evidence of blood clot on the ultrasound and your other labs were similar to prior.  Your kidney function was slightly elevated as was the troponin which was not changing.  We feel you are safe for discharge home at this time.  If any symptoms change or worsen, please return to the nearest emergency department.

## 2018-10-10 NOTE — ED Triage Notes (Signed)
Pt went to urgent care this AM for right leg pain. Urgent care sent him here for BP readings of 218/118 and 220/112. Denies h/a, dizziness

## 2018-10-10 NOTE — Progress Notes (Signed)
Right lower extremity venous duplex completed. Refer to "CV Proc" under chart review to view preliminary results.  10/10/2018 12:57 PM Maudry Mayhew, MHA, RVT, RDCS, RDMS

## 2018-10-10 NOTE — ED Provider Notes (Signed)
[facility] EMERGENCY DEPARTMENT Provider Note   CSN: [id number] Arrival date & time: 10/10/18  1020     History   Chief Complaint Chief Complaint  Patient presents with   Hypertension    HPI Jacob Henson is a 58 y.o. male.     The history is provided by the patient and medical records. No language interpreter was used.  Hypertension This is a recurrent problem. The current episode started 12 to 24 hours ago. The problem occurs constantly. The problem has not changed since onset.Pertinent negatives include no chest pain, no abdominal pain, no headaches and no shortness of breath. Nothing aggravates the symptoms. Nothing relieves the symptoms. He has tried nothing for the symptoms. The treatment provided no relief.    History reviewed. No pertinent past medical history.  There are no active problems to display for this patient.   History reviewed. No pertinent surgical history.      Home Medications    Prior to Admission medications   Medication Sig Start Date End Date Taking? Authorizing Provider  cetirizine (ZYRTEC) 10 MG tablet Take 10 mg by mouth daily.    [provider]  hydrochlorothiazide (HYDRODIURIL) 25 MG tablet Take 1 tablet (25 mg total) by mouth daily. 07/22/11 07/21/12  Hazel Sams, PA-C  ranitidine (ZANTAC) 150 MG tablet Take 150 mg by mouth 2 (two) times daily.    [provider]    Family History History reviewed. No pertinent family history.  Social History Social History   Tobacco Use   Smoking status: Never Smoker  Substance Use Topics   Alcohol use: No   Drug use: Not on file     Allergies   Patient has no known allergies.   Review of Systems Review of Systems  Constitutional: Negative for chills, diaphoresis, fatigue and fever.  HENT: Negative for congestion.   Eyes: Negative for visual disturbance.  Respiratory: Positive for cough. Negative for chest tightness, shortness of breath  and wheezing.   Cardiovascular: Negative for chest pain.  Gastrointestinal: Negative for abdominal pain, constipation, diarrhea, nausea and vomiting.  Genitourinary: Negative for flank pain and frequency.  Musculoskeletal: Negative for back pain, neck pain and neck stiffness.  Skin: Positive for rash and wound.  Neurological: Negative for light-headedness, numbness and headaches.  Psychiatric/Behavioral: Negative for agitation.  All other systems reviewed and are negative.    Physical Exam Updated Vital Signs BP (!) 198/106 (BP Location: Right Arm) Comment: Simultaneous filing. User may not have seen previous data. Comment (BP Location): Simultaneous filing. User may not have seen previous data.   Pulse (!) 104 Comment: Simultaneous filing. User may not have seen previous data.   Temp 98.2 F (36.8 C) (Oral) Comment: Simultaneous filing. User may not have seen previous data. Comment (Src): Simultaneous filing. User may not have seen previous data.   Resp 10 Comment: Simultaneous filing. User may not have seen previous data.   Ht 6\' 5"  (1.956 m)    Wt (!) 140.6 kg    SpO2 94% Comment: Simultaneous filing. User may not have seen previous data.   BMI 36.76 kg/m   Physical Exam Vitals signs and nursing note reviewed.  Constitutional:      General: He is not in acute distress.    Appearance: He is well-developed. He is not ill-appearing, toxic-appearing or diaphoretic.  HENT:     Head: Normocephalic and atraumatic.     Nose: Nose normal.  Eyes:     Extraocular Movements: Extraocular  movements intact.     Conjunctiva/sclera: Conjunctivae normal.     Pupils: Pupils are equal, round, and reactive to light.  Neck:     Musculoskeletal: Neck supple. No muscular tenderness.  Cardiovascular:     Rate and Rhythm: Normal rate and regular rhythm.     Heart sounds: No murmur.  Pulmonary:     Effort: Pulmonary effort is normal. No respiratory distress.     Breath sounds: Normal breath sounds.    Abdominal:     Palpations: Abdomen is soft.     Tenderness: There is no abdominal tenderness.  Musculoskeletal:        General: Tenderness and signs of injury present.     Right lower leg: He exhibits tenderness and swelling. He exhibits no bony tenderness, no deformity and no laceration. No edema.     Left lower leg: No edema.       Legs:  Skin:    General: Skin is warm and dry.     Capillary Refill: Capillary refill takes less than 2 seconds.     Findings: Erythema and rash present.  Neurological:     General: No focal deficit present.     Mental Status: He is alert.  Psychiatric:        Mood and Affect: Mood normal.      ED Treatments / Results  Labs (all labs ordered are listed, but only abnormal results are displayed) Labs Reviewed  COMPREHENSIVE METABOLIC PANEL - Abnormal; Notable for the following components:      Result Value   Glucose, Bld 104 (*)    Creatinine, Ser 1.33 (*)    Calcium 8.8 (*)    GFR calc non Af Amer 59 (*)    All other components within normal limits  TROPONIN I (HIGH SENSITIVITY) - Abnormal; Notable for the following components:   Troponin I (High Sensitivity) 26 (*)    All other components within normal limits  TROPONIN I (HIGH SENSITIVITY) - Abnormal; Notable for the following components:   Troponin I (High Sensitivity) 26 (*)    All other components within normal limits  CBC WITH DIFFERENTIAL/PLATELET  LACTIC ACID, PLASMA  LACTIC ACID, PLASMA    EKG EKG Interpretation  Date/Time:  Saturday October 10 2018 10:31:12 EDT Ventricular Rate:  106 PR Interval:    QRS Duration: 136 QT Interval:  374 QTC Calculation: 497 R Axis:   112 Text Interpretation:  Sinus tachycardia RBBB and LPFB Baseline wander in lead(s) II III aVF V5 When compared to prior, similar RBBB.  No STEMI Confirmed by Antony Blackbird [phone]) on 10/10/2018 10:36:31 AM   Radiology Dg Chest 2 View  Result Date: 10/10/2018 CLINICAL DATA:  Leg pain.  Elevated blood  pressure. EXAM: CHEST - 2 VIEW COMPARISON:  None. FINDINGS: The heart size and mediastinal contours are within normal limits. Both lungs are clear. The visualized skeletal structures are unremarkable. IMPRESSION: No active cardiopulmonary disease. Electronically Signed   By: Dorise Bullion III M.D   On: 10/10/2018 11:40   Vas [place] Lower Extremity Venous (dvt) (only [facility])  Result Date: 10/10/2018  Lower Venous Study Indications: Swelling.  Comparison Study: No prior study. Performing Technologist: Maudry Mayhew MHA, RDMS, RVT, RDCS  Examination Guidelines: A complete evaluation includes B-mode imaging, spectral Doppler, color Doppler, and power Doppler as needed of all accessible portions of each vessel. Bilateral testing is considered an integral part of a complete examination. Limited examinations for reoccurring indications may be performed as noted.  +---------+---------------+---------+-----------+----------+--------------+  RIGHT     Compressibility Phasicity Spontaneity Properties Thrombus Aging  +---------+---------------+---------+-----------+----------+--------------+  CFV       Full            Yes       Yes                                    +---------+---------------+---------+-----------+----------+--------------+  SFJ       Full                                                             +---------+---------------+---------+-----------+----------+--------------+  FV Prox   Full                                                             +---------+---------------+---------+-----------+----------+--------------+  FV Mid    Full                                                             +---------+---------------+---------+-----------+----------+--------------+  FV Distal Full                                                             +---------+---------------+---------+-----------+----------+--------------+  PFV       Full                                                              +---------+---------------+---------+-----------+----------+--------------+  POP       Full            Yes       Yes                                    +---------+---------------+---------+-----------+----------+--------------+  PTV       Full                                                             +---------+---------------+---------+-----------+----------+--------------+  PERO      Full                                                             +---------+---------------+---------+-----------+----------+--------------+  +----+---------------+---------+-----------+----------+--------------+  LEFT Compressibility Phasicity Spontaneity Properties Thrombus Aging  +----+---------------+---------+-----------+----------+--------------+  CFV  Full            Yes       Yes                                    +----+---------------+---------+-----------+----------+--------------+   Summary: Right: There is no evidence of deep vein thrombosis in the lower extremity. No cystic structure found in the popliteal fossa. Ultrasound characteristics of enlarged lymph nodes are noted in the groin. Left: There is no evidence of deep vein thrombosis in the lower extremity.  *See table(s) above for measurements and observations.    Preliminary     Procedures Procedures (including critical care time)  Medications Ordered in ED Medications - No data to display   Initial Impression / Assessment and Plan / ED Course  I have reviewed the triage vital signs and the nursing notes.  Pertinent labs & imaging results that were available during my care of the patient were reviewed by me and considered in my medical decision making (see chart for details).        GURTEJ CLEGHORN is a 58 y.o. male with a past medical history of elevated blood pressures but no diagnosis of hypertension who presents from urgent care for evaluation of right leg pain, redness, warmth, and elevated blood pressures.  Patient reports that he had  his shin banged by a trailer ramp 2 weeks ago and there was a dent in his shin.  He reports that he has had some pain since the injury but he is still unable to walk on without difficulty.  He reports that over the last few days he has begun to have more redness and warmth at this location spreading more proximally in his shin.  He reports it is slightly swollen.  He went to an urgent care today to be evaluated and he was told he needs to be evaluated for possible DVT versus cellulitis and was also found to have blood pressures of [id number] systolic.  He reports that he had a dry cough recently but otherwise has had no chest pain, shortness of breath, fatigue, or other symptoms of severity of his blood pressure such as headaches or syncope.  He otherwise is feeling well except for the dry cough leg symptoms.  On exam, patient does have warmth and erythema of the right shin with an abrasion present.  Good pulse and sensation distally.  Good capillary refill and strength in the foot.  No tenderness in the knee or more proximally.  No significant calf tenderness.  Lungs clear and chest nontender.  Good pulses in upper extremities.  Exam otherwise unremarkable no focal neurologic deficits.  Had a shared decision making with patient and wife about work-up.  As they were sent for DVT ultrasound rule out, will get ultrasound.  I suspect he is having cellulitis from bacteria introduced during the accident.  Given his ability to ambulate and the more superficial tenderness rather deep tenderness, we agreed to hold on x-ray of the shin at this time.  Low suspicion for fracture.  We will however get blood work to look for endorgan damage from elevated blood pressures as his blood pressure was over 200 on arrival here.  Patient reports that he was on HCTZ previously during a an ED visit and did well for this before stopped taking it.  He reports  that was in 2013.  He has not seen a physician since then.  We will also get chest  x-ray given the mild coughing.  If x-ray of the chest is reassuring and ultrasound is negative and blood work is reassuring, anticipate discharge with prescription for HCTZ as he reports the seem to work and antibiotics for the cellulitis.  I suspect the cellulitis may be increasing his blood pressure as well as patient reports his blood pressure normally is only mildly elevated at work.  He denies other complaints and anticipate discharge after reassessment.  Patient's blood work shows normal lactic acid.  Troponin was slightly elevated but stable, likely due to the elevated blood pressures.  Metabolic panel shows slight elevation in creatinine.  Otherwise reassuring.  Chest x-ray shows no pneumonia.  Ultrasound shows no DVT.  Patient will be started back on his HCTZ which he reports is worked for him in the past.  As he is having no symptoms otherwise, we did not feel was necessary to give him IV blood pressure medications at this time.  I suspect he has been elevated over 200 for quite some time.  The HCTZ which worked for him before will gently bring his blood pressure down.  He will follow-up with his PCP to further manage his blood pressure.  Patient will also get started antibiotics for the cellulitis on his leg.  Patient agreed with plan of care and understood return precautions.  Patient discharged in good condition.  Final Clinical Impressions(s) / ED Diagnoses   Final diagnoses:  Cellulitis of right lower extremity  Hypertension, unspecified type    ED Discharge Orders         Ordered    hydrochlorothiazide (HYDRODIURIL) 25 MG tablet  Daily     10/10/18 1509    doxycycline (VIBRAMYCIN) 100 MG capsule  2 times daily     10/10/18 1509          Clinical Impression: 1. Cellulitis of right lower extremity   2. Hypertension, unspecified type     Disposition: Discharge  Condition: Good  I have discussed the results, Dx and Tx plan with the pt(& family if present). He/she/they  expressed understanding and agree(s) with the plan. Discharge instructions discussed at great length. Strict return precautions discussed and pt &/or family have verbalized understanding of the instructions. No further questions at time of discharge.    New Prescriptions   DOXYCYCLINE (VIBRAMYCIN) 100 MG CAPSULE    Take 1 capsule (100 mg total) by mouth 2 (two) times daily for 7 days.   HYDROCHLOROTHIAZIDE (HYDRODIURIL) 25 MG TABLET    Take 1 tablet (25 mg total) by mouth daily.    Follow Up: [facility] [place] [phone] Schedule an appointment as soon as possible for a visit    [place] [address] [place]        Jemaine Prokop, Gwenyth Allegra, MD 10/10/18 [phone]

## 2018-10-12 ENCOUNTER — Telehealth: Payer: 59

## 2018-10-14 ENCOUNTER — Encounter: Payer: Self-pay | Admitting: Family Medicine

## 2018-10-14 ENCOUNTER — Ambulatory Visit (INDEPENDENT_AMBULATORY_CARE_PROVIDER_SITE_OTHER): Payer: 59 | Admitting: Family Medicine

## 2018-10-14 ENCOUNTER — Other Ambulatory Visit: Payer: Self-pay

## 2018-10-14 VITALS — BP 138/82 | HR 110 | Temp 98.0°F | Ht 75.0 in | Wt 331.1 lb

## 2018-10-14 DIAGNOSIS — R05 Cough: Secondary | ICD-10-CM

## 2018-10-14 DIAGNOSIS — Z1211 Encounter for screening for malignant neoplasm of colon: Secondary | ICD-10-CM | POA: Diagnosis not present

## 2018-10-14 DIAGNOSIS — R1310 Dysphagia, unspecified: Secondary | ICD-10-CM | POA: Diagnosis not present

## 2018-10-14 DIAGNOSIS — Z7689 Persons encountering health services in other specified circumstances: Secondary | ICD-10-CM

## 2018-10-14 DIAGNOSIS — I10 Essential (primary) hypertension: Secondary | ICD-10-CM

## 2018-10-14 DIAGNOSIS — K219 Gastro-esophageal reflux disease without esophagitis: Secondary | ICD-10-CM

## 2018-10-14 DIAGNOSIS — R059 Cough, unspecified: Secondary | ICD-10-CM

## 2018-10-14 MED ORDER — OMEPRAZOLE 20 MG PO CPDR: 20.0000 mg | DELAYED_RELEASE_CAPSULE | Freq: Every day | ORAL | 3 refills | Status: DC

## 2018-10-14 MED ORDER — BENZONATATE 100 MG PO CAPS: 100.0000 mg | ORAL_CAPSULE | Freq: Three times a day (TID) | ORAL | 2 refills | Status: DC | PRN

## 2018-10-14 NOTE — Patient Instructions (Signed)
Check your blood pressure 2 times a week- keep a log and bring with you to your next appointment  I think you have a cyclical cough- I have sent in benzonate, avoid triggers (caffeine, mint, menthol), keep water on hand.   Take Claritin not Claritin D (the D can run up your blood pressure).   Follow up in 1 month for complete physical and labs    How to Take Your Blood Pressure You can take your blood pressure at home with a machine. You may need to check your blood pressure at home:  To check if you have high blood pressure (hypertension).  To check your blood pressure over time.  To make sure your blood pressure medicine is working. Supplies needed: You will need a blood pressure machine, or monitor. You can buy one at a drugstore or online. When choosing one:  Choose one with an arm cuff.  Choose one that wraps around your upper arm. Only one finger should fit between your arm and the cuff.  Do not choose one that measures your blood pressure from your wrist or finger. Your doctor can suggest a monitor. How to prepare Avoid these things for 30 minutes before checking your blood pressure:  Drinking caffeine.  Drinking alcohol.  Eating.  Smoking.  Exercising. Five minutes before checking your blood pressure:  Pee.  Sit in a dining chair. Avoid sitting in a soft couch or armchair.  Be quiet. Do not talk. How to take your blood pressure Follow the instructions that came with your machine. If you have a digital blood pressure monitor, these may be the instructions: 1. Sit up straight. 2. Place your feet on the floor. Do not cross your ankles or legs. 3. Rest your left arm at the level of your heart. You may rest it on a table, desk, or chair. 4. Pull up your shirt sleeve. 5. Wrap the blood pressure cuff around the upper part of your left arm. The cuff should be 1 inch (2.5 cm) above your elbow. It is best to wrap the cuff around bare skin. 6. Fit the cuff snugly  around your arm. You should be able to place only one finger between the cuff and your arm. 7. Put the cord inside the groove of your elbow. 8. Press the power button. 9. Sit quietly while the cuff fills with air and loses air. 10. Write down the numbers on the screen. 11. Wait 2-3 minutes and then repeat steps 1-10. What do the numbers mean? Two numbers make up your blood pressure. The first number is called systolic pressure. The second is called diastolic pressure. An example of a blood pressure reading is "120 over 80" (or 120/80). If you are an adult and do not have a medical condition, use this guide to find out if your blood pressure is normal: Normal  First number: below 120.  Second number: below 80. Elevated  First number: 120-129.  Second number: below 80. Hypertension stage 1  First number: 130-139.  Second number: 80-89. Hypertension stage 2  First number: 140 or above.  Second number: 52 or above. Your blood pressure is above normal even if only the top or bottom number is above normal. Follow these instructions at home:  Check your blood pressure as often as your doctor tells you to.  Take your monitor to your next doctor's appointment. Your doctor will: ? Make sure you are using it correctly. ? Make sure it is working right.  Make  sure you understand what your blood pressure numbers should be.  Tell your doctor if your medicines are causing side effects. Contact a doctor if:  Your blood pressure keeps being high. Get help right away if:  Your first blood pressure number is higher than 180.  Your second blood pressure number is higher than 120. This information is not intended to replace advice given to you by your health care provider. Make sure you discuss any questions you have with your health care provider. Document Released: 01/11/2008 Document Revised: 01/10/2017 Document Reviewed: 07/07/2015 Elsevier Patient Education  2020 [employer].

## 2018-10-14 NOTE — Progress Notes (Signed)
Subjective:    Patient ID: Jacob Henson, male    DOB: 1960/07/28, 58 y.o.   MRN: [id number]  HPI This is a 58 yo male who presents today to establish care. No regular medical care since 2013. Works at [employer] in [employer]. Likes his work. Some stress. Married.   Last CPE- many years ago PSA- unknown Colonoscopy- never Tdap- Unsure Flu- gets at work Exercise- on feet  Cellulitis- he was seen 10/10/18 for HTN and cellulitis of right leg. He had negative ultrasound and was given doxy 100 mg bid. Doing much better.   HTN- previously on HCTZ, stopped many years ago. BP significantly elevated in ER. He was restarted on HCTZ 25 mg po qd. He has a blood pressure cuff at home, was high for a couple of days then has been better.   Chronic sinus problems- currently on Claritin D, Nasacort, some improvement  Chronic cough- several weeks, using Delsym and Halls cough drops. Worse with sinus issues, getting hot, dry throat. No sputum, no SOB, no wheeze.   GERD- many years, taking famotidine prn. Has had esophagus stretched in past. Has difficulty swallowing pills, food every other day. Gets caught in throat. Worsening symptoms with doxy.   Obesity- aware that he needs to lose weight. Drinks soda, sweet tea, eats out frequently .   Past Medical History:  Diagnosis Date  . Hypertension    No past surgical history on file. Family History  Problem Relation Age of Onset  . Heart disease Father   . Hearing loss Father   . Diabetes Father   . Heart disease Paternal Grandmother   . Diabetes Paternal Grandmother   . Heart attack Paternal Grandfather    Social History   Tobacco Use  . Smoking status: Former Smoker    Packs/day: 1.00    Years: 4.00    Pack years: 4.00    Types: Cigarettes    Quit date: 10/14/1978    Years since quitting: 40.0  . Smokeless tobacco: Never Used  Substance Use Topics  . Alcohol use: No  . Drug use: Never     Review of Systems Per HPI     Objective:   Physical Exam Vitals signs reviewed.  Constitutional:      General: He is not in acute distress.    Appearance: Normal appearance. He is obese. He is not ill-appearing, toxic-appearing or diaphoretic.  Eyes:     Conjunctiva/sclera: Conjunctivae normal.  Cardiovascular:     Rate and Rhythm: Normal rate and regular rhythm.     Heart sounds: Normal heart sounds.  Pulmonary:     Effort: Pulmonary effort is normal.     Breath sounds: Normal breath sounds.  Skin:    General: Skin is warm and dry.     Comments: Right lower anterior leg with hyperpigmentation, mild localized erythema and small area indentation.   Neurological:     Mental Status: He is alert and oriented to person, place, and time.  Psychiatric:        Mood and Affect: Mood normal.        Thought Content: Thought content normal.        Judgment: Judgment normal.       BP 138/82 (BP Location: Left Arm, Patient Position: Sitting, Cuff Size: Large)   Pulse (!) 110   Temp 98 F (36.7 C) (Temporal)   Ht 6\' 3"  (1.905 m)   Wt (!) 331 lb 1.9 oz (150.2 kg)   SpO2 93%  BMI 41.39 kg/m  BP Readings from Last 3 Encounters:  10/14/18 138/82  10/10/18 (!) 188/103  07/22/11 135/83   Wt Readings from Last 3 Encounters:  10/14/18 (!) 331 lb 1.9 oz (150.2 kg)  10/10/18 (!) 310 lb (140.6 kg)  07/22/11 290 lb (131.5 kg)       Assessment & Plan:  1. Encounter to establish care - has not had regular primary care in some time, will have him come back in 1 month for CPE and will continue to get him caught up on health maintenance  2. Cough - suspect cyclical cough ? Related to allergies and / or GERD, discussed stopping cycle of cough - recent normal CXR - benzonatate (TESSALON) 100 MG capsule; Take 1-2 capsules (100-200 mg total) by mouth 3 (three) times daily as needed for cough.  Dispense: 40 capsule; Refill: 2  3. Dysphagia, unspecified type - Ambulatory referral to Gastroenterology  4. Screening for  colon cancer - Ambulatory referral to Gastroenterology  5. Gastroesophageal reflux disease, esophagitis presence not specified - omeprazole (PRILOSEC) 20 MG capsule; Take 1 capsule (20 mg total) by mouth daily.  Dispense: 30 capsule; Refill: 3  6. Essential hypertension - improved with restarting HCTZ, will have him bring his log to CPE visit in 4 weeks and will recheck BMET.  - Provided information about taking blood pressure at home   Clarene Reamer, FNP-BC  [place] [facility], [place]  10/14/2018 10:49 AM

## 2018-11-20 ENCOUNTER — Encounter: Payer: Self-pay | Admitting: Family Medicine

## 2018-11-23 ENCOUNTER — Other Ambulatory Visit: Payer: Self-pay | Admitting: Family Medicine

## 2018-11-23 DIAGNOSIS — R05 Cough: Secondary | ICD-10-CM

## 2018-11-23 DIAGNOSIS — R059 Cough, unspecified: Secondary | ICD-10-CM

## 2018-11-23 MED ORDER — BENZONATATE 100 MG PO CAPS: 100.0000 mg | ORAL_CAPSULE | Freq: Three times a day (TID) | ORAL | 2 refills | Status: DC | PRN

## 2018-11-23 NOTE — Telephone Encounter (Signed)
Tessalon Perles Last Rx'd 10/14/2018  Cough has returned. Requesting a refill.   Last prescribed benzonatate (TESSALON) 100 MG capsule; Take 1-2 capsules (100-200 mg total) by mouth 3 (three) times daily as needed for cough.  Dispense: 40 capsule; Refill: 2 Patient should have refills at the pharmacy. Message sent to patient to contact her pharmacy for refills as they should have these on file.

## 2018-11-23 NOTE — Telephone Encounter (Signed)
Please advise Jacob Henson, pt has used up all refills d/t taking high dose (4 tablets a day)

## 2018-12-09 ENCOUNTER — Other Ambulatory Visit: Payer: Self-pay

## 2018-12-09 ENCOUNTER — Encounter: Payer: Self-pay | Admitting: Family Medicine

## 2018-12-09 ENCOUNTER — Ambulatory Visit (INDEPENDENT_AMBULATORY_CARE_PROVIDER_SITE_OTHER): Payer: 59 | Admitting: Family Medicine

## 2018-12-09 VITALS — BP 164/100 | HR 84 | Temp 98.1°F | Ht 76.0 in | Wt 334.8 lb

## 2018-12-09 DIAGNOSIS — Z23 Encounter for immunization: Secondary | ICD-10-CM | POA: Diagnosis not present

## 2018-12-09 DIAGNOSIS — Z125 Encounter for screening for malignant neoplasm of prostate: Secondary | ICD-10-CM

## 2018-12-09 DIAGNOSIS — I10 Essential (primary) hypertension: Secondary | ICD-10-CM

## 2018-12-09 DIAGNOSIS — Z0000 Encounter for general adult medical examination without abnormal findings: Secondary | ICD-10-CM | POA: Diagnosis not present

## 2018-12-09 DIAGNOSIS — J3089 Other allergic rhinitis: Secondary | ICD-10-CM

## 2018-12-09 MED ORDER — FLUTICASONE PROPIONATE 50 MCG/ACT NA SUSP: 2.0000 | Freq: Every day | NASAL | 6 refills | Status: DC

## 2018-12-09 NOTE — Progress Notes (Signed)
Acute Office Visit  Subjective:    Patient ID: Jacob Henson, male    DOB: [birth year], 58 y.o.   MRN: [id number]  Chief Complaint  Patient presents with  . Annual Exam    HPI Patient is in today for his annual physical.   Pt works as Programmer, systems. Hypertension- Pt checks his BP twice a week. His BP runs in SBP 170-204 and DBP 98-117. Pt had headache several time with elevated blood pressure.  Allergies -Pt saw allergist who prescribed  Montelukast 10 mg, Desloratadine 5 mg for him.  Pt stopped taking Claritin D for this month. Pt uses Flonase with good results.   Pt feels he developed cough when he started wearing mask. Pt has occasionally productive clear cough now. He uses tessalon that provides a good relief.   Last CPE- ? PSA- done today Colonoscopy-trying to schedule for December Flu- 11/2018 Tdap-11/2018 Dental- long time ago Eye-long time ago Exercise- walking and lifting at work Diet- once a day soda, cutting on carbohydrates  Stress related to work, wakes up during night   Past Medical History:  Diagnosis Date  . Allergy   . GERD (gastroesophageal reflux disease)    Has required esophageal dilation in past  . Hypertension     Past Surgical History:  Procedure Laterality Date  . ESOPHAGEAL DILATION      Family History  Problem Relation Age of Onset  . Heart disease Father   . Hearing loss Father   . Diabetes Father   . Heart disease Paternal Grandmother   . Diabetes Paternal Grandmother   . Heart attack Paternal Grandfather     Social History   Socioeconomic History  . Marital status: Married    Spouse name: Not on file  . Number of children: Not on file  . Years of education: Not on file  . Highest education level: Not on file  Occupational History  . Occupation: full time  Social Needs  . Financial resource strain: Not on file  . Food insecurity    Worry: Not on file    Inability: Not on file  . Transportation needs    Medical:  Not on file    Non-medical: Not on file  Tobacco Use  . Smoking status: Former Smoker    Packs/day: 1.00    Years: 4.00    Pack years: 4.00    Types: Cigarettes    Quit date: 10/14/1978    Years since quitting: 40.1  . Smokeless tobacco: Never Used  Substance and Sexual Activity  . Alcohol use: No  . Drug use: Never  . Sexual activity: Not on file  Lifestyle  . Physical activity    Days per week: Not on file    Minutes per session: Not on file  . Stress: Not on file  Relationships  . Social Herbalist on phone: Not on file    Gets together: Not on file    Attends religious service: Not on file    Active member of club or organization: Not on file    Attends meetings of clubs or organizations: Not on file    Relationship status: Not on file  . Intimate partner violence    Fear of current or ex partner: Not on file    Emotionally abused: Not on file    Physically abused: Not on file    Forced sexual activity: Not on file  Other Topics Concern  . Not on file  Social History Narrative  . Not on file    Outpatient Medications Prior to Visit  Medication Sig Dispense Refill  . benzonatate (TESSALON) 100 MG capsule Take 1-2 capsules (100-200 mg total) by mouth 3 (three) times daily as needed for cough. 60 capsule 2  . dextromethorphan (DELSYM) 30 MG/5ML liquid Take 60 mg by mouth 2 (two) times daily as needed for cough.    . loratadine-pseudoephedrine (CLARITIN-D 12-HOUR) 5-120 MG tablet Take 1 tablet by mouth daily.    Marland Kitchen omeprazole (PRILOSEC) 20 MG capsule Take 1 capsule (20 mg total) by mouth daily. 30 capsule 3  . Triamcinolone Acetonide (NASACORT ALLERGY 24HR NA) Place 1 spray into the nose as needed (congestion).    . hydrochlorothiazide (HYDRODIURIL) 25 MG tablet Take 1 tablet (25 mg total) by mouth daily. (Patient not taking: Reported on 10/10/2018) 30 tablet 0  . desloratadine (CLARINEX) 5 MG tablet Take 5 mg by mouth daily.    . famotidine (PEPCID) 20 MG tablet  Take 20 mg by mouth 2 (two) times daily as needed for heartburn or indigestion.    . hydrochlorothiazide (HYDRODIURIL) 25 MG tablet Take 1 tablet (25 mg total) by mouth daily. (Patient not taking: Reported on 12/09/2018) 30 tablet 0  . montelukast (SINGULAIR) 10 MG tablet Take 10 mg by mouth.     No facility-administered medications prior to visit.     No Known Allergies  Review of Systems  Constitutional: Negative for fever and malaise/fatigue.  HENT: Negative for congestion, ear pain, sinus pain and sore throat.   Eyes: Negative for blurred vision, pain and discharge.  Respiratory: Positive for cough. Negative for shortness of breath and wheezing.   Cardiovascular: Negative for chest pain, palpitations and leg swelling.  Gastrointestinal: Negative for abdominal pain, constipation, diarrhea, nausea and vomiting.  Genitourinary: Negative for dysuria.  Musculoskeletal: Positive for back pain.  Skin: Negative for itching and rash.  Endo/Heme/Allergies: Positive for environmental allergies.       Objective:    Physical Exam  Constitutional: He is oriented to person, place, and time. He appears well-developed and well-nourished. No distress.  HENT:  Head: Normocephalic and atraumatic.  Right Ear: External ear normal.  Left Ear: External ear normal.  Nose: Nose normal.  Mouth/Throat: Oropharynx is clear and moist. No oropharyngeal exudate.  Eyes: Conjunctivae and EOM are normal. Right eye exhibits no discharge. Left eye exhibits no discharge. No scleral icterus.  Neck: Normal range of motion.  Cardiovascular: Normal rate, regular rhythm and normal heart sounds. Exam reveals no gallop and no friction rub.  No murmur heard. Pulmonary/Chest: Effort normal and breath sounds normal. No respiratory distress. He has no wheezes. He exhibits no tenderness.  Abdominal: Soft. Bowel sounds are normal. There is no abdominal tenderness.  Genitourinary:    Penis normal.  No penile tenderness.   Musculoskeletal: Normal range of motion.        General: No edema.  Neurological: He is alert and oriented to person, place, and time.  Skin: Skin is warm and dry. He is not diaphoretic.  Psychiatric: He has a normal mood and affect. His behavior is normal.    BP (!) 182/101   Pulse 84   Temp 98.1 F (36.7 C)   Ht 6\' 4"  (1.93 m)   Wt (!) 151.9 kg   SpO2 96%   BMI 40.75 kg/m  Wt Readings from Last 3 Encounters:  12/09/18 (!) 151.9 kg  10/14/18 (!) 150.2 kg  10/10/18 (!) 140.6 kg  Health Maintenance Due  Topic Date Due  . Hepatitis C Screening  [birth year]  . HIV Screening  03/25/1975  . TETANUS/TDAP  03/25/1979  . COLONOSCOPY  03/24/2010    There are no preventive care reminders to display for this patient.   No results found for: TSH Lab Results  Component Value Date   WBC 6.5 10/10/2018   HGB 16.8 10/10/2018   HCT 49.8 10/10/2018   MCV 92.6 10/10/2018   PLT 161 10/10/2018   Lab Results  Component Value Date   NA 138 10/10/2018   K 3.8 10/10/2018   CO2 27 10/10/2018   GLUCOSE 104 (H) 10/10/2018   BUN 10 10/10/2018   CREATININE 1.33 (H) 10/10/2018   BILITOT 0.9 10/10/2018   ALKPHOS 90 10/10/2018   AST 25 10/10/2018   ALT 31 10/10/2018   PROT 7.5 10/10/2018   ALBUMIN 3.8 10/10/2018   CALCIUM 8.8 (L) 10/10/2018   ANIONGAP 12 10/10/2018   No results found for: CHOL No results found for: HDL No results found for: LDLCALC No results found for: TRIG No results found for: CHOLHDL No results found for: HGBA1C     Assessment & Plan:   1. Essential hypertension Increased blood pressure. Pt started on Lisinopril 20 mg and advice to continue to record his Blood pressure readings for the next office visit - Basic metabolic panel - TSH - Microalbumin / creatinine urine ratio  2. Morbid obesity ([place]) Pt is encouraged to continue reducing carbohydrates and refined sugars.  - Basic metabolic panel - TSH - Lipid Panel - Hemoglobin A1c - Vitamin D,  25-hydroxy  3. Screening for prostate cancer - PSA  Problem List Items Addressed This Visit    None       No orders of the defined types were placed in this encounter.    Rica Koyanagi, RN

## 2018-12-09 NOTE — Patient Instructions (Signed)
Follow up in 4-6 weeks for blood pressure check and blood work  Health Maintenance, Male Adopting a healthy lifestyle and getting preventive care are important in promoting health and wellness. Ask your health care provider about:  The right schedule for you to have regular tests and exams.  Things you can do on your own to prevent diseases and keep yourself healthy. What should I know about diet, weight, and exercise? Eat a healthy diet   Eat a diet that includes plenty of vegetables, fruits, low-fat dairy products, and lean protein.  Do not eat a lot of foods that are high in solid fats, added sugars, or sodium. Maintain a healthy weight Body mass index (BMI) is a measurement that can be used to identify possible weight problems. It estimates body fat based on height and weight. Your health care provider can help determine your BMI and help you achieve or maintain a healthy weight. Get regular exercise Get regular exercise. This is one of the most important things you can do for your health. Most adults should:  Exercise for at least 150 minutes each week. The exercise should increase your heart rate and make you sweat (moderate-intensity exercise).  Do strengthening exercises at least twice a week. This is in addition to the moderate-intensity exercise.  Spend less time sitting. Even light physical activity can be beneficial. Watch cholesterol and blood lipids Have your blood tested for lipids and cholesterol at 58 years of age, then have this test every 5 years. You may need to have your cholesterol levels checked more often if:  Your lipid or cholesterol levels are high.  You are older than 58 years of age.  You are at high risk for heart disease. What should I know about cancer screening? Many types of cancers can be detected early and may often be prevented. Depending on your health history and family history, you may need to have cancer screening at various ages. This may  include screening for:  Colorectal cancer.  Prostate cancer.  Skin cancer.  Lung cancer. What should I know about heart disease, diabetes, and high blood pressure? Blood pressure and heart disease  High blood pressure causes heart disease and increases the risk of stroke. This is more likely to develop in people who have high blood pressure readings, are of African descent, or are overweight.  Talk with your health care provider about your target blood pressure readings.  Have your blood pressure checked: ? Every 3-5 years if you are 58-35 years of age. ? Every year if you are 58 years old or older.  If you are between the ages of 5 and 69 and are a current or former smoker, ask your health care provider if you should have a one-time screening for abdominal aortic aneurysm (AAA). Diabetes Have regular diabetes screenings. This checks your fasting blood sugar level. Have the screening done:  Once every three years after age 58 if you are at a normal weight and have a low risk for diabetes.  More often and at a younger age if you are overweight or have a high risk for diabetes. What should I know about preventing infection? Hepatitis B If you have a higher risk for hepatitis B, you should be screened for this virus. Talk with your health care provider to find out if you are at risk for hepatitis B infection. Hepatitis C Blood testing is recommended for:  Everyone born from [birth year] through 1965.  Anyone with known risk factors for  hepatitis C. Sexually transmitted infections (STIs)  You should be screened each year for STIs, including gonorrhea and chlamydia, if: ? You are sexually active and are younger than 58 years of age. ? You are older than 58 years of age and your health care provider tells you that you are at risk for this type of infection. ? Your sexual activity has changed since you were last screened, and you are at increased risk for chlamydia or gonorrhea. Ask your  health care provider if you are at risk.  Ask your health care provider about whether you are at high risk for HIV. Your health care provider may recommend a prescription medicine to help prevent HIV infection. If you choose to take medicine to prevent HIV, you should first get tested for HIV. You should then be tested every 3 months for as long as you are taking the medicine. Follow these instructions at home: Lifestyle  Do not use any products that contain nicotine or tobacco, such as cigarettes, e-cigarettes, and chewing tobacco. If you need help quitting, ask your health care provider.  Do not use street drugs.  Do not share needles.  Ask your health care provider for help if you need support or information about quitting drugs. Alcohol use  Do not drink alcohol if your health care provider tells you not to drink.  If you drink alcohol: ? Limit how much you have to 0-2 drinks a day. ? Be aware of how much alcohol is in your drink. In the [place] one drink equals one 12 oz bottle of beer (355 mL), one 5 oz glass of wine (148 mL), or one 1 oz glass of hard liquor (44 mL). General instructions  Schedule regular health, dental, and eye exams.  Stay current with your vaccines.  Tell your health care provider if: ? You often feel depressed. ? You have ever been abused or do not feel safe at home. Summary  Adopting a healthy lifestyle and getting preventive care are important in promoting health and wellness.  Follow your health care provider's instructions about healthy diet, exercising, and getting tested or screened for diseases.  Follow your health care provider's instructions on monitoring your cholesterol and blood pressure. This information is not intended to replace advice given to you by your health care provider. Make sure you discuss any questions you have with your health care provider. Document Released: 07/27/2007 Document Revised: 01/21/2018 Document Reviewed:  01/21/2018 Elsevier Patient Education  2020 [employer].

## 2018-12-09 NOTE — Progress Notes (Signed)
Subjective:    Patient ID: Jacob Henson, male    DOB: [birth year], 58 y.o.   MRN: [id number]  HPI This is a 58 yo male who presents today for CPE.   Last CPE- many years ago PSA- unknown Colonoscopy- trying to schedule for December Tdap- unknown Flu-today Dental- not for many years Eye- many years Exercise- no formal  HTN- has small supply of HCTZ but did not get additional fills. He has taken some of his wife's lisinopril and felt that he tolerated it. He had decreased headache after taking. Did not like diuretic effect of HCTZ.    Past Medical History:  Diagnosis Date  . Allergy   . GERD (gastroesophageal reflux disease)    Has required esophageal dilation in past  . Hypertension    Past Surgical History:  Procedure Laterality Date  . ESOPHAGEAL DILATION     Family History  Problem Relation Age of Onset  . Heart disease Father   . Hearing loss Father   . Diabetes Father   . Heart disease Paternal Grandmother   . Diabetes Paternal Grandmother   . Heart attack Paternal Grandfather    Social History   Tobacco Use  . Smoking status: Former Smoker    Packs/day: 1.00    Years: 4.00    Pack years: 4.00    Types: Cigarettes    Quit date: 10/14/1978    Years since quitting: 40.1  . Smokeless tobacco: Never Used  Substance Use Topics  . Alcohol use: No  . Drug use: Never    Review of Systems  Constitutional: Negative.   HENT: Positive for postnasal drip and rhinorrhea.        Has seen allergist who recommended allergy shots and he is unable to afford and take the time off of work.   Eyes: Positive for itching (and watery eyes with allergies).  Respiratory: Positive for cough (chronic, clear sputum, some relief with tessalon). Negative for shortness of breath.        Denies snoring, apnea, excessive daytime drowsiness.   Cardiovascular: Negative.   Gastrointestinal: Negative.   Endocrine: Negative.   Genitourinary: Negative.   Musculoskeletal: Positive  for back pain (chronic, intermittent).  Skin: Negative.   Allergic/Immunologic: Negative.   Neurological: Negative.   Hematological: Negative.   Psychiatric/Behavioral: Positive for sleep disturbance.       Objective:   Physical Exam Vitals signs reviewed.  Constitutional:      General: He is not in acute distress.    Appearance: Normal appearance. He is obese. He is not ill-appearing, toxic-appearing or diaphoretic.  HENT:     Head: Normocephalic and atraumatic.     Right Ear: External ear normal.     Left Ear: External ear normal.     Nose: Nose normal.  Eyes:     Conjunctiva/sclera: Conjunctivae normal.  Neck:     Musculoskeletal: Normal range of motion and neck supple.  Cardiovascular:     Rate and Rhythm: Normal rate.  Pulmonary:     Effort: Pulmonary effort is normal.  Genitourinary:    Penis: Normal.      Scrotum/Testes: Normal.  Musculoskeletal: Normal range of motion.     Right lower leg: No edema.     Left lower leg: No edema.  Skin:    General: Skin is warm and dry.  Neurological:     Mental Status: He is alert and oriented to person, place, and time.  Psychiatric:  Mood and Affect: Mood normal.        Behavior: Behavior normal.        Thought Content: Thought content normal.        Judgment: Judgment normal.       BP (!) 182/101   Pulse 84   Temp 98.1 F (36.7 C)   Ht 6\' 4"  (1.93 m)   Wt (!) 334 lb 12.8 oz (151.9 kg)   SpO2 96%   BMI 40.75 kg/m  Wt Readings from Last 3 Encounters:  12/09/18 (!) 334 lb 12.8 oz (151.9 kg)  10/14/18 (!) 331 lb 1.9 oz (150.2 kg)  10/10/18 (!) 310 lb (140.6 kg)   BP Readings from Last 3 Encounters:  12/09/18 (!) 182/101  10/14/18 138/82  10/10/18 (!) 188/103   BP: (!) 164/100        Assessment & Plan:  1. Annual physical exam - Discussed and encouraged healthy lifestyle choices- adequate sleep, regular exercise, stress management and healthy food choices.    2. Essential hypertension - will  start lisinopril 20 mg and have him continue to monitor his bp at home and bring log for 4-6 week follow up - Basic metabolic panel - TSH - Microalbumin / creatinine urine ratio; Future  3. Morbid obesity ([place]) - weight up a little, encouraged him to decrease portions, continue to decrease soda intake - Basic metabolic panel - TSH - Lipid Panel - Hemoglobin A1c - Vitamin D, 25-hydroxy  4. Screening for prostate cancer - PSA  5. Need for Tdap vaccination - Tdap vaccine greater than or equal to 7yo IM  6. Non-seasonal allergic rhinitis due to other allergic trigger - discussed his current medication regimen and recommendations of allergist, may be difficult to control his severe symptoms without immunotherapy - fluticasone (FLONASE) 50 MCG/ACT nasal spray; Place 2 sprays into both nostrils daily.  Dispense: 16 g; Refill: 6  - follow up in 4-6 weeks for bp recheck, bmet  Clarene Reamer, FNP-BC  [facility] at [facility], [place]  12/11/2018 8:34 AM

## 2018-12-10 ENCOUNTER — Encounter: Payer: Self-pay | Admitting: Family Medicine

## 2018-12-10 ENCOUNTER — Other Ambulatory Visit (INDEPENDENT_AMBULATORY_CARE_PROVIDER_SITE_OTHER): Payer: 59

## 2018-12-10 DIAGNOSIS — I10 Essential (primary) hypertension: Secondary | ICD-10-CM

## 2018-12-10 LAB — BASIC METABOLIC PANEL
BUN: 18 mg/dL (ref 6–23)
CO2: 32 mEq/L (ref 19–32)
Calcium: 9.3 mg/dL (ref 8.4–10.5)
Chloride: 101 mEq/L (ref 96–112)
Creatinine, Ser: 1.62 mg/dL — ABNORMAL HIGH (ref 0.40–1.50)
GFR: 43.87 mL/min — ABNORMAL LOW (ref >60.00)
Glucose, Bld: 98 mg/dL (ref 70–99)
Potassium: 4.8 mEq/L (ref 3.5–5.1)
Sodium: 141 mEq/L (ref 135–145)

## 2018-12-10 LAB — LIPID PANEL
Cholesterol: 166 mg/dL (ref 0–200)
HDL: 33 mg/dL — ABNORMAL LOW (ref >39.00)
LDL Cholesterol: 96 mg/dL (ref 0–99)
NonHDL: 132.55
Total CHOL/HDL Ratio: 5
Triglycerides: 182 mg/dL — ABNORMAL HIGH (ref 0.0–149.0)
VLDL: 36.4 mg/dL (ref 0.0–40.0)

## 2018-12-10 LAB — HEMOGLOBIN A1C: Hgb A1c MFr Bld: 5.7 % (ref 4.6–6.5)

## 2018-12-10 LAB — MICROALBUMIN / CREATININE URINE RATIO
Creatinine,U: 223.8 mg/dL
Microalb Creat Ratio: 3.3 mg/g (ref 0.0–30.0)
Microalb, Ur: 7.4 mg/dL — ABNORMAL HIGH (ref 0.0–1.9)

## 2018-12-10 MED ORDER — LISINOPRIL 20 MG PO TABS: 20.0000 mg | ORAL_TABLET | Freq: Every day | ORAL | 0 refills | Status: DC

## 2018-12-10 NOTE — Telephone Encounter (Signed)
Dr. Einar Pheasant can you review please? Per recent note it states patient is to start Lisinopril 20 mg. Can this be sent in?

## 2018-12-10 NOTE — Telephone Encounter (Signed)
Reviewed provider note and BP  BP Readings from Last 3 Encounters:  12/09/18 (!) 164/100  10/14/18 138/82  10/10/18 (!) 188/103   Seems reasonable to start Lisinopril 20 per provider note. #30 day supply sent to pharmacy. PCP for further refills.

## 2018-12-11 ENCOUNTER — Encounter: Payer: Self-pay | Admitting: Family Medicine

## 2018-12-11 LAB — PSA: PSA: 0.23 ng/mL (ref 0.10–4.00)

## 2018-12-11 LAB — TSH: TSH: 3.27 u[IU]/mL (ref 0.35–4.50)

## 2018-12-11 LAB — VITAMIN D 25 HYDROXY (VIT D DEFICIENCY, FRACTURES): VITD: 24.82 ng/mL — ABNORMAL LOW (ref 30.00–100.00)

## 2019-01-13 ENCOUNTER — Other Ambulatory Visit: Payer: Self-pay

## 2019-01-13 ENCOUNTER — Ambulatory Visit: Payer: 59 | Admitting: Family Medicine

## 2019-01-13 VITALS — BP 138/72 | HR 92 | Temp 97.7°F | Ht 76.0 in | Wt 336.2 lb

## 2019-01-13 DIAGNOSIS — R059 Cough, unspecified: Secondary | ICD-10-CM

## 2019-01-13 DIAGNOSIS — K219 Gastro-esophageal reflux disease without esophagitis: Secondary | ICD-10-CM | POA: Diagnosis not present

## 2019-01-13 DIAGNOSIS — R05 Cough: Secondary | ICD-10-CM | POA: Diagnosis not present

## 2019-01-13 DIAGNOSIS — R1310 Dysphagia, unspecified: Secondary | ICD-10-CM

## 2019-01-13 DIAGNOSIS — I10 Essential (primary) hypertension: Secondary | ICD-10-CM | POA: Diagnosis not present

## 2019-01-13 MED ORDER — OMEPRAZOLE 20 MG PO CPDR: 20.0000 mg | DELAYED_RELEASE_CAPSULE | Freq: Every day | ORAL | 0 refills | Status: DC

## 2019-01-13 MED ORDER — BENZONATATE 100 MG PO CAPS: 100.0000 mg | ORAL_CAPSULE | Freq: Three times a day (TID) | ORAL | 2 refills | Status: DC | PRN

## 2019-01-13 MED ORDER — LISINOPRIL 20 MG PO TABS: 20.0000 mg | ORAL_TABLET | Freq: Every day | ORAL | 3 refills | Status: DC

## 2019-01-13 NOTE — Progress Notes (Signed)
Subjective:    Patient ID: Jacob Henson, male    DOB: December 29, 1960, 58 y.o.   MRN: [id number]  HPI Chief Complaint  Patient presents with  . Hypertension    follow up  . Gastroesophageal Reflux    needs new RX for Omeprazole at 40 mg- he has been taking 20 mg 1 twice daily   This is a 58 year old male who presents today for follow-up of hypertension.  He was seen in the office a couple of weeks ago with elevated blood pressure and was started on lisinopril 20 mg.  He reports that he feels much better with decreased headaches.  Denies any side effects.  He stopped taking decongestants as well.  GERD-improved with omeprazole 20 mg twice daily.  He is okay to stay on this until he can get in with Dr. Watt Climes.   Chronic cough-much better control with benzonatate and as needed Tessalon.  Past Medical History:  Diagnosis Date  . Allergy   . GERD (gastroesophageal reflux disease)    Has required esophageal dilation in past  . Hypertension    Past Surgical History:  Procedure Laterality Date  . ESOPHAGEAL DILATION     Family History  Problem Relation Age of Onset  . Heart disease Father   . Hearing loss Father   . Diabetes Father   . Heart disease Paternal Grandmother   . Diabetes Paternal Grandmother   . Heart attack Paternal Grandfather    Social History   Tobacco Use  . Smoking status: Former Smoker    Packs/day: 1.00    Years: 4.00    Pack years: 4.00    Types: Cigarettes    Quit date: 10/14/1978    Years since quitting: 40.2  . Smokeless tobacco: Never Used  Substance Use Topics  . Alcohol use: No  . Drug use: Never      Review of Systems Per HPI    Objective:   Physical Exam Physical Exam  Constitutional: Oriented to person, place, and time. He appears well-developed and well-nourished.  HENT:  Head: Normocephalic and atraumatic.  Eyes: Conjunctivae are normal.  Neck: Normal range of motion. Neck supple.  Cardiovascular: Normal rate, regular rhythm  and normal heart sounds.   Pulmonary/Chest: Effort normal and breath sounds normal.  Musculoskeletal: Normal range of motion.  Neurological: Alert and oriented to person, place, and time.  Skin: Skin is warm and dry.  Psychiatric: Normal mood and affect. Behavior is normal. Judgment and thought content normal.  Vitals reviewed.   BP 138/72   Pulse 92   Temp 97.7 F (36.5 C)   Ht 6\' 4"  (1.93 m)   Wt (!) 336 lb 4 oz (152.5 kg)   SpO2 97%   BMI 40.93 kg/m  Wt Readings from Last 3 Encounters:  01/13/19 (!) 336 lb 4 oz (152.5 kg)  12/09/18 (!) 334 lb 12.8 oz (151.9 kg)  10/14/18 (!) 331 lb 1.9 oz (150.2 kg)   BP Readings from Last 3 Encounters:  01/13/19 138/72  12/09/18 (!) 164/100  10/14/18 138/82        Assessment & Plan:  1. Essential hypertension -Significantly improved BP readings.  Encouraged him to work on weight loss, monitor sodium intake and increase physical activity - lisinopril (ZESTRIL) 20 MG tablet; Take 1 tablet (20 mg total) by mouth daily.  Dispense: 90 tablet; Refill: 3 - Basic metabolic panel -Follow-up in 6 months  2. Cough -Improved - benzonatate (TESSALON) 100 MG capsule; Take 1-2 capsules (100-200  mg total) by mouth 3 (three) times daily as needed for cough.  Dispense: 60 capsule; Refill: 2  3. Gastroesophageal reflux disease - omeprazole (PRILOSEC) 20 MG capsule; Take 1 capsule (20 mg total) by mouth daily.  Dispense: 180 capsule; Refill: 0  4. Dysphagia, unspecified type -We will check on referral to Dr. Watt Climes, for this as well as screening colonoscopy  This visit occurred during the SARS-CoV-2 public health emergency.  Safety protocols were in place, including screening questions prior to the visit, additional usage of staff PPE, and extensive cleaning of exam room while observing appropriate contact time as indicated for disinfecting solutions.    Clarene Reamer, FNP-BC  [facility] at [facility], [facility]   01/15/2019 5:50 PM

## 2019-01-13 NOTE — Patient Instructions (Addendum)
Good to see you today  Let me know if you can't get in with Dr. Earlean Shawl, I can send you to another gastroenterologist.   Follow up in 6 months

## 2019-01-14 LAB — BASIC METABOLIC PANEL
BUN: 17 mg/dL (ref 6–23)
CO2: 27 mEq/L (ref 19–32)
Calcium: 9 mg/dL (ref 8.4–10.5)
Chloride: 102 mEq/L (ref 96–112)
Creatinine, Ser: 1.42 mg/dL (ref 0.40–1.50)
GFR: 51.06 mL/min — ABNORMAL LOW (ref >60.00)
Glucose, Bld: 102 mg/dL — ABNORMAL HIGH (ref 70–99)
Potassium: 4 mEq/L (ref 3.5–5.1)
Sodium: 138 mEq/L (ref 135–145)

## 2019-01-15 ENCOUNTER — Encounter: Payer: Self-pay | Admitting: Family Medicine

## 2019-01-18 ENCOUNTER — Telehealth: Payer: Self-pay

## 2019-01-18 NOTE — Telephone Encounter (Signed)
LMOM.  Pt needs to contact our office [id number] endoscopy w/[facility].

## 2019-02-18 ENCOUNTER — Encounter: Payer: Self-pay | Admitting: Family Medicine

## 2019-02-19 ENCOUNTER — Other Ambulatory Visit: Payer: Self-pay | Admitting: Family Medicine

## 2019-02-19 ENCOUNTER — Encounter: Payer: Self-pay | Admitting: Family Medicine

## 2019-02-19 DIAGNOSIS — K219 Gastro-esophageal reflux disease without esophagitis: Secondary | ICD-10-CM

## 2019-02-19 MED ORDER — OMEPRAZOLE 20 MG PO CPDR: 20.0000 mg | DELAYED_RELEASE_CAPSULE | Freq: Two times a day (BID) | ORAL | 0 refills | Status: DC

## 2019-02-23 NOTE — Telephone Encounter (Signed)
This has been taken care of.   Nothing further needed.

## 2019-07-05 ENCOUNTER — Other Ambulatory Visit: Payer: Self-pay | Admitting: Family Medicine

## 2019-07-05 DIAGNOSIS — K219 Gastro-esophageal reflux disease without esophagitis: Secondary | ICD-10-CM

## 2019-07-05 MED ORDER — OMEPRAZOLE 20 MG PO CPDR: 20.0000 mg | DELAYED_RELEASE_CAPSULE | Freq: Two times a day (BID) | ORAL | 0 refills | Status: DC

## 2019-07-14 ENCOUNTER — Other Ambulatory Visit: Payer: Self-pay

## 2019-07-14 ENCOUNTER — Encounter: Payer: Self-pay | Admitting: Family Medicine

## 2019-07-14 ENCOUNTER — Ambulatory Visit: Payer: 59 | Admitting: Family Medicine

## 2019-07-14 VITALS — BP 128/72 | HR 82 | Temp 98.4°F | Ht 76.0 in | Wt 332.1 lb

## 2019-07-14 DIAGNOSIS — S46211A Strain of muscle, fascia and tendon of other parts of biceps, right arm, initial encounter: Secondary | ICD-10-CM

## 2019-07-14 DIAGNOSIS — I10 Essential (primary) hypertension: Secondary | ICD-10-CM | POA: Diagnosis not present

## 2019-07-14 NOTE — Patient Instructions (Signed)
Good to see you today  Please follow up in 6 months for your complete physical

## 2019-07-14 NOTE — Progress Notes (Signed)
Subjective:    Patient ID: Jacob Henson, male    DOB: 03-May-1960, 59 y.o.   MRN: [id number]  HPI Chief Complaint  Patient presents with  . Follow-up    Right arm/bicep pain - radiating from elbow up to shoulder. Some tingling down to fingers. Using [facility], salonpas patches and Advil.   This is a 59 yo male who presents today for follow up and above cc.   He reports that he has overall been doing well.  Right arm pain started about a week ago.  Only possible trigger was pulling on the lawn more start cable.  No shoulder or elbow pain.  Pain has slowly been improving.  He is able to do everything he needs to he do but is conscientious of movement.  Has lost a couple of pounds.  Not really working on changing eating habits.  Did significantly decreased soda intake after her last visit but has been drinking a little bit recently.  Has previously declined bariatric referral.  Diet recall- breakfast- chicken biscuit, sausage, eggs, Lunch- sandwich, bbq, sloppy joe/ hamburger no bread. Dinner- chicken, burgers, pizza  Review of Systems    Denies chest pain, shortness of breath, abdominal pain, diarrhea/constipation, dysuria, leg swelling. Objective:   Physical Exam Vitals reviewed.  Constitutional:      General: He is not in acute distress.    Appearance: Normal appearance. He is obese. He is not ill-appearing, toxic-appearing or diaphoretic.  HENT:     Head: Normocephalic and atraumatic.     Right Ear: External ear normal.     Left Ear: External ear normal.  Eyes:     Conjunctiva/sclera: Conjunctivae normal.  Cardiovascular:     Rate and Rhythm: Normal rate and regular rhythm.     Heart sounds: Normal heart sounds.  Pulmonary:     Effort: Pulmonary effort is normal.     Breath sounds: Normal breath sounds.  Musculoskeletal:     Cervical back: Normal range of motion and neck supple.     Right lower leg: No edema.     Left lower leg: No edema.     Comments: Right arm with  full range of motion shoulder and elbow.  No tenderness with joint palpation.  Right bicep without erythema, ecchymosis or swelling.  Mild tenderness of middle bicep with palpation no tenderness at either joint insertion site.  Skin:    General: Skin is warm and dry.  Neurological:     Mental Status: He is alert and oriented to person, place, and time.  Psychiatric:        Mood and Affect: Mood normal.        Behavior: Behavior normal.        Thought Content: Thought content normal.        Judgment: Judgment normal.       BP 128/72 (BP Location: Left Arm, Patient Position: Sitting, Cuff Size: Large)   Pulse 82   Temp 98.4 F (36.9 C) (Temporal)   Ht 6\' 4"  (1.93 m)   Wt (!) 332 lb 1.9 oz (150.6 kg)   SpO2 96%   BMI 40.43 kg/m  Wt Readings from Last 3 Encounters:  07/14/19 (!) 332 lb 1.9 oz (150.6 kg)  01/13/19 (!) 336 lb 4 oz (152.5 kg)  12/09/18 (!) 334 lb 12.8 oz (151.9 kg)       Assessment & Plan:  1. Biceps strain, right, initial encounter -Improving, continue current over-the-counter treatment and follow-up if any worsening  symptoms or loss of function  2. Essential hypertension -Well-controlled with lisinopril 20 mg  3. Morbid obesity ([place]) -He has lost a couple of pounds and we discussed on dietary modifications.  He was very successful with a ketogenic diet until the holidays and we discussed resuming this as he found it very doable.  -Follow-up in 6 months for CPE/labs  This visit occurred during the SARS-CoV-2 public health emergency.  Safety protocols were in place, including screening questions prior to the visit, additional usage of staff PPE, and extensive cleaning of exam room while observing appropriate contact time as indicated for disinfecting solutions.      Clarene Reamer, FNP-BC  [facility] at [facility]  07/15/2019 9:31 AM

## 2019-07-15 ENCOUNTER — Encounter: Payer: Self-pay | Admitting: Family Medicine

## 2019-08-25 LAB — BASIC METABOLIC PANEL
BUN: 19 (ref 4–21)
CO2: 29 — AB (ref 13–22)
Chloride: 101 (ref 99–108)
Creatinine: 1.4 — AB (ref 0.6–1.3)
Glucose: 94
Potassium: 4.2 (ref 3.4–5.3)
Sodium: 138 (ref 137–147)

## 2019-08-25 LAB — COMPREHENSIVE METABOLIC PANEL
Calcium: 9.4 (ref 8.7–10.7)
GFR calc Af Amer: 61
GFR calc non Af Amer: 51

## 2019-08-25 LAB — CBC: RBC: 4.89 (ref 3.87–5.11)

## 2019-08-25 LAB — CBC AND DIFFERENTIAL
HCT: 46 (ref 41–53)
Hemoglobin: 15.7 (ref 13.5–17.5)
WBC: 7.3

## 2019-08-31 ENCOUNTER — Other Ambulatory Visit: Payer: Self-pay | Admitting: Gastroenterology

## 2019-08-31 DIAGNOSIS — R109 Unspecified abdominal pain: Secondary | ICD-10-CM

## 2019-09-03 ENCOUNTER — Ambulatory Visit: Payer: 59 | Admitting: Family Medicine

## 2019-09-03 ENCOUNTER — Encounter: Payer: Self-pay | Admitting: Family Medicine

## 2019-09-03 ENCOUNTER — Other Ambulatory Visit: Payer: Self-pay

## 2019-09-03 VITALS — BP 114/80 | HR 81 | Temp 97.8°F | Ht 76.0 in | Wt 323.5 lb

## 2019-09-03 DIAGNOSIS — K219 Gastro-esophageal reflux disease without esophagitis: Secondary | ICD-10-CM | POA: Diagnosis not present

## 2019-09-03 DIAGNOSIS — M67921 Unspecified disorder of synovium and tendon, right upper arm: Secondary | ICD-10-CM | POA: Diagnosis not present

## 2019-09-03 DIAGNOSIS — I10 Essential (primary) hypertension: Secondary | ICD-10-CM

## 2019-09-03 MED ORDER — LISINOPRIL 20 MG PO TABS: 20.0000 mg | ORAL_TABLET | Freq: Every day | ORAL | 3 refills | Status: DC

## 2019-09-03 MED ORDER — OMEPRAZOLE 20 MG PO CPDR: 20.0000 mg | DELAYED_RELEASE_CAPSULE | Freq: Two times a day (BID) | ORAL | 1 refills | Status: DC

## 2019-09-03 MED ORDER — OMEPRAZOLE MAGNESIUM 20 MG PO TBEC: 20.0000 mg | DELAYED_RELEASE_TABLET | Freq: Two times a day (BID) | ORAL | 0 refills | Status: DC

## 2019-09-03 NOTE — Progress Notes (Signed)
Subjective:    Patient ID: Jacob Henson, male    DOB: 01-26-1961, 59 y.o.   MRN: [id number]  HPI Chief Complaint  Patient presents with  . Arm Pain    R bicep x over a month and getting worse    This is a 59 yo male who presents today with above cc. Was seen 07/14/2019 for same complaint, reports worsening symptoms. Using [facility] or [place] [facility] and wearing a brace with some relief. Has been taking ibuprofen/ acetaminophen with some relief. Pain is getting worse, some movement triggers like reaching back. Still with pain in his bicep. Some relief with holding arms over head. Pain is aching and sometimes sharp. Some tightness, no spasm.   Currently seeing Dr. Watt Climes for some dysphagia, upper abdominal gas. Had "normal" blood work, scheduled for [place] next week. Worse with eating, some decreased appetite.  Denies diarrhea, constipation, blood in stool.  Review of Systems Per HPI    Objective:   Physical Exam Vitals reviewed.  Constitutional:      General: He is not in acute distress.    Appearance: Normal appearance. He is obese. He is not ill-appearing, toxic-appearing or diaphoretic.  HENT:     Head: Normocephalic and atraumatic.  Cardiovascular:     Rate and Rhythm: Normal rate and regular rhythm.     Heart sounds: Normal heart sounds.  Pulmonary:     Effort: Pulmonary effort is normal.     Breath sounds: Normal breath sounds.  Musculoskeletal:     Cervical back: Normal range of motion and neck supple.     Comments: Right shoulder and elbow with normal range of motion.  Shoulder and elbow without pain to palpation.  Some pain to palpation of bicep muscle.  No obvious abnormality palpated or visualized.  Skin:    General: Skin is warm and dry.  Neurological:     Mental Status: He is alert and oriented to person, place, and time.  Psychiatric:        Mood and Affect: Mood normal.        Behavior: Behavior normal.        Thought Content: Thought content normal.        Judgment:  Judgment normal.      BP 114/80   Pulse 81   Temp 97.8 F (36.6 C) (Temporal)   Ht 6\' 4"  (1.93 m)   Wt (!) 323 lb 8 oz (146.7 kg)   SpO2 95%   BMI 39.38 kg/m  Wt Readings from Last 3 Encounters:  09/03/19 (!) 323 lb 8 oz (146.7 kg)  07/14/19 (!) 332 lb 1.9 oz (150.6 kg)  01/13/19 (!) 336 lb 4 oz (152.5 kg)        Assessment & Plan:  1. Disorder of tendon of right biceps -Unclear etiology, was getting better now is getting worse.  Will refer him to Ortho, may need ultrasound or imaging - Ambulatory referral to Orthopedic Surgery  2. Essential hypertension -Well-controlled on current medication without side effects - lisinopril (ZESTRIL) 20 MG tablet; Take 1 tablet (20 mg total) by mouth daily.  Dispense: 90 tablet; Refill: 3  3. Gastroesophageal reflux disease, unspecified whether esophagitis present -Continue omeprazole twice daily while undergoing GI work-up -Records requested from Dr. Watt Climes. - omeprazole (PRILOSEC) 20 MG capsule; Take 1 capsule (20 mg total) by mouth 2 (two) times daily before a meal.  Dispense: 180 capsule; Refill: 1  This visit occurred during the SARS-CoV-2 public health emergency.  Safety protocols were in place, including screening questions prior to the visit, additional usage of staff PPE, and extensive cleaning of exam room while observing appropriate contact time as indicated for disinfecting solutions.    Clarene Reamer, FNP-BC  [facility] at [facility]  09/03/2019 1:49 PM

## 2019-09-03 NOTE — Patient Instructions (Signed)
Good to see you today  You will get a call about orthopedic appointment

## 2019-09-06 ENCOUNTER — Ambulatory Visit
Admission: RE | Admit: 2019-09-06 | Discharge: 2019-09-06 | Disposition: A | Discharge status: Home / Self Care | Payer: 59 | Source: Ambulatory Visit | Attending: Gastroenterology | Admitting: Gastroenterology

## 2019-09-06 DIAGNOSIS — R109 Unspecified abdominal pain: Secondary | ICD-10-CM

## 2019-09-06 IMAGING — US US ABDOMEN COMPLETE
1 series · 14 of 25 positions shown · non-contrast
Comparison: None.

CLINICAL DATA: Acute generalized abdominal pain.

EXAM:
ABDOMEN ULTRASOUND COMPLETE

[Series 1: us abdomen complete · 0.18mm/px · 14 of 86 slices shown]
[im 1/86]
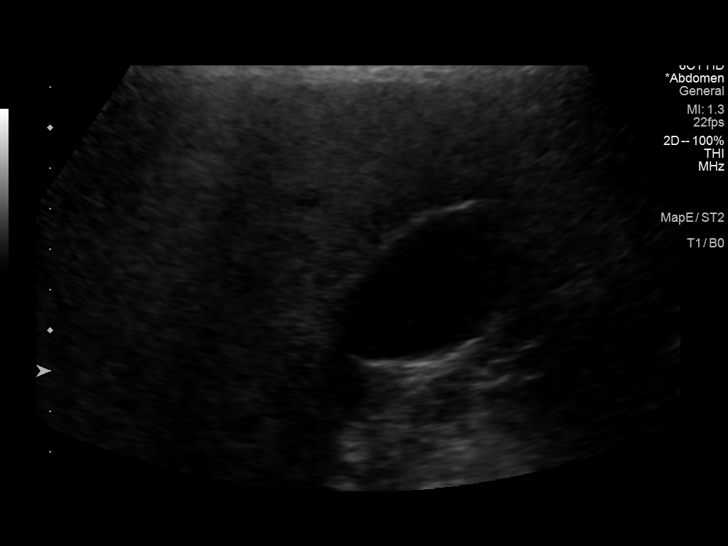
[im 8/86]
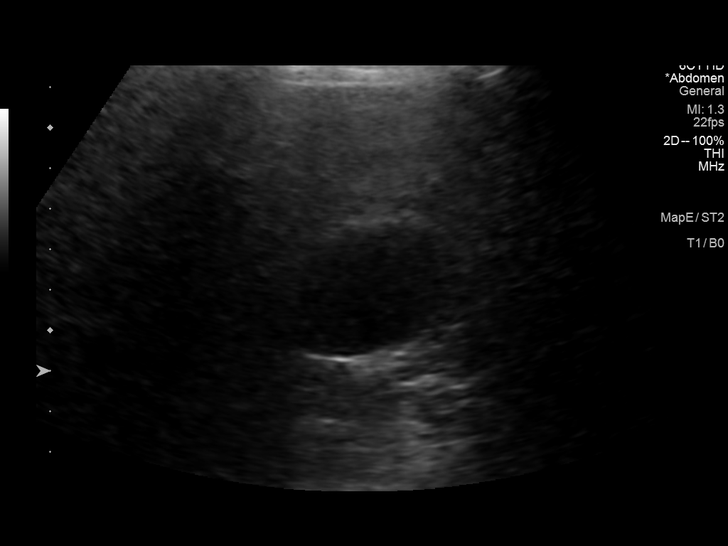
[im 15/86]
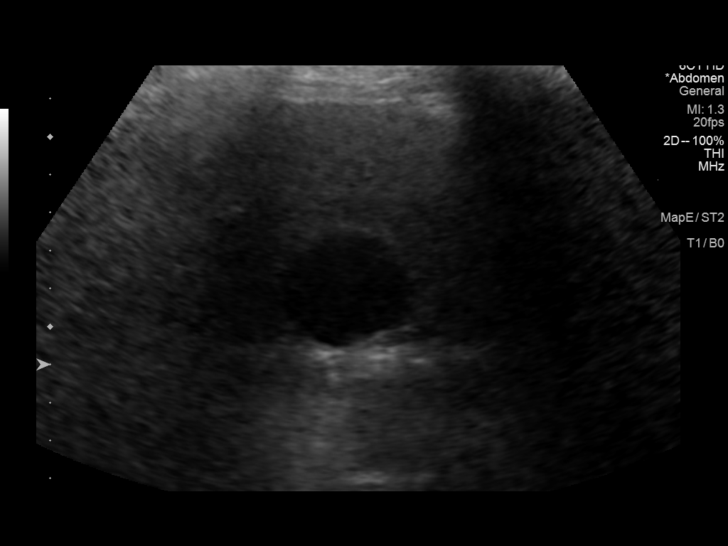
[im 22/86]
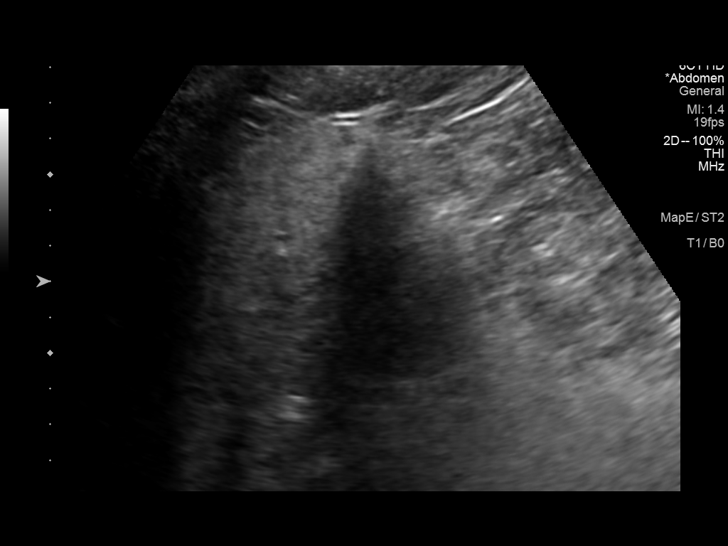
[im 29/86]
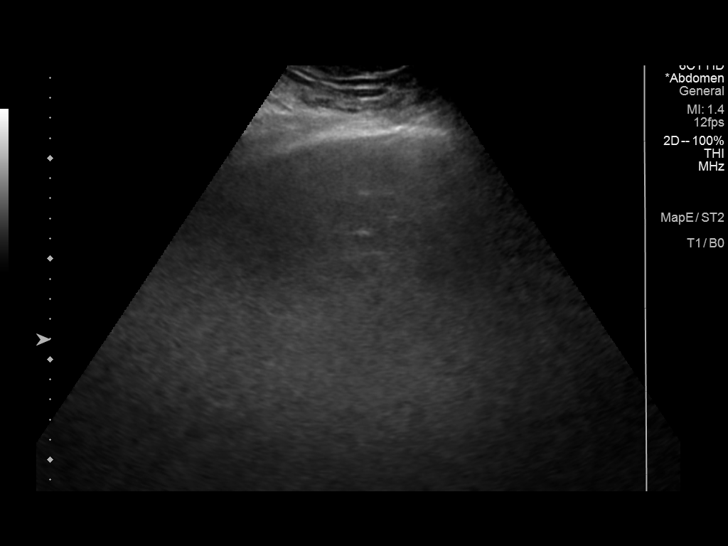
[im 32/86]
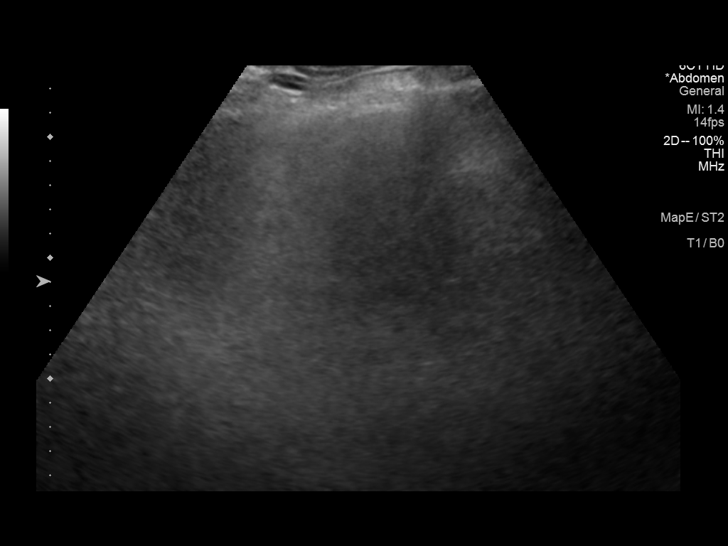
[im 39/86]
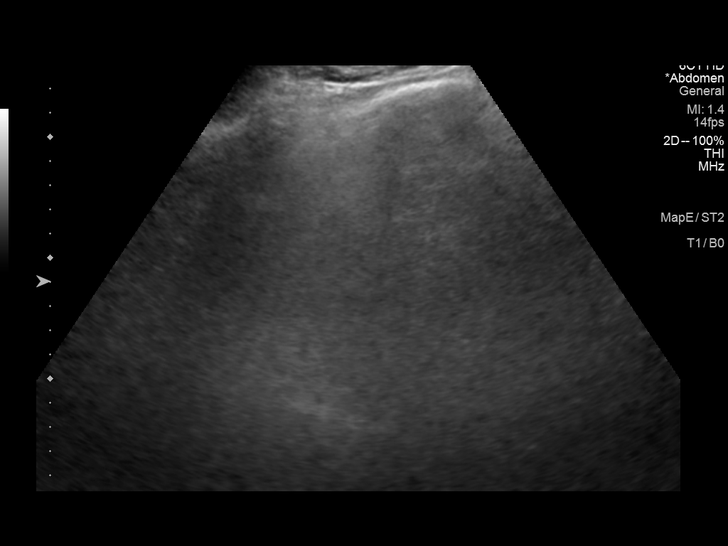
[im 47/86]
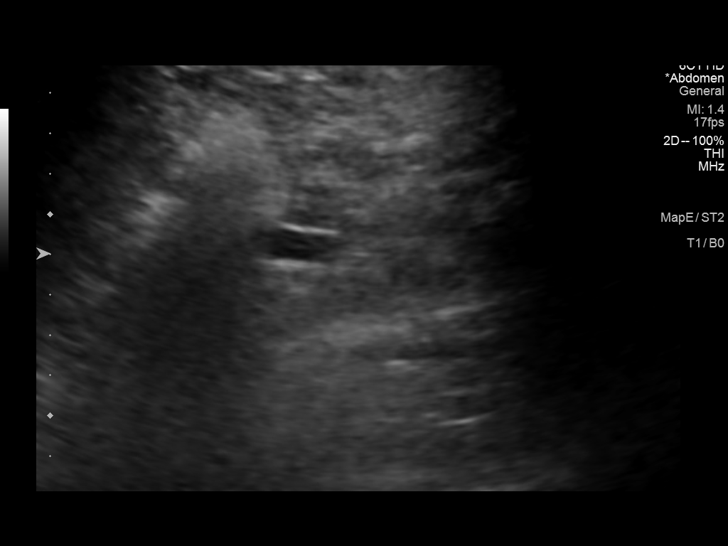
[im 54/86]
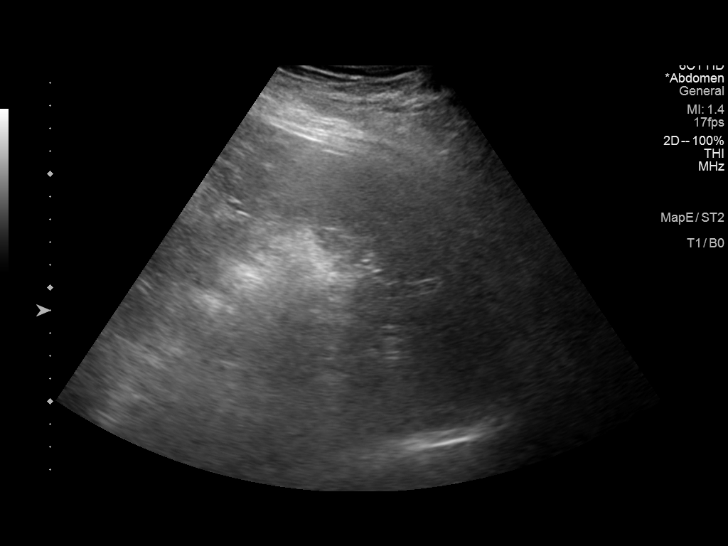
[im 57/86]
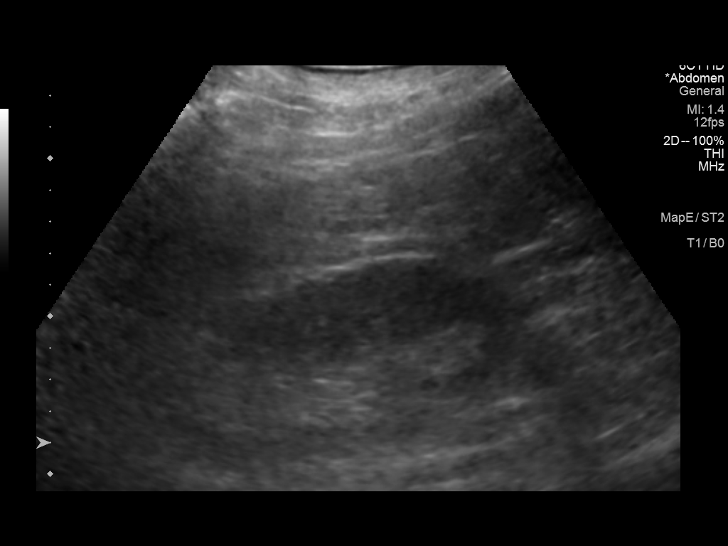
[im 64/86]
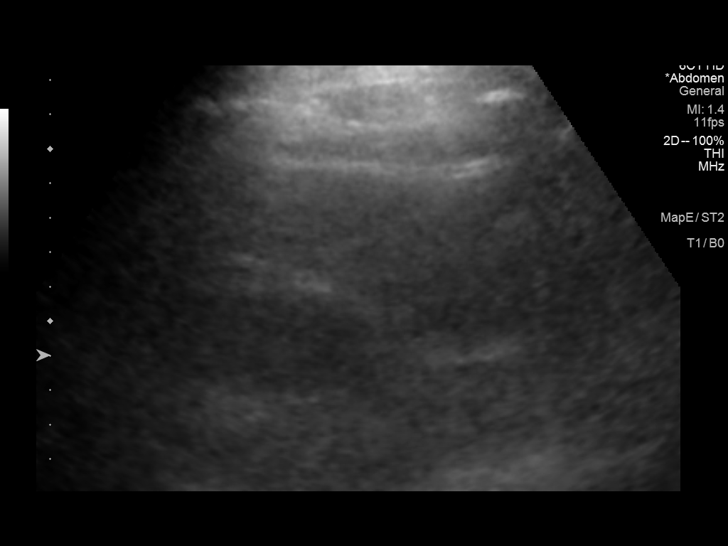
[im 71/86]
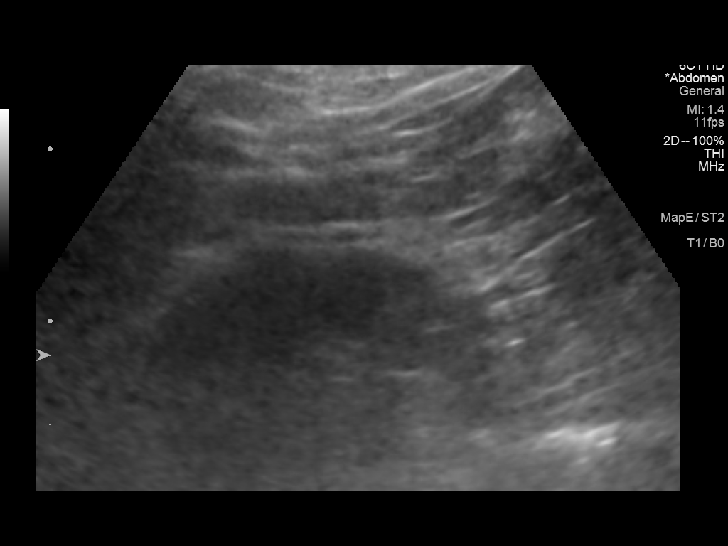
[im 78/86]
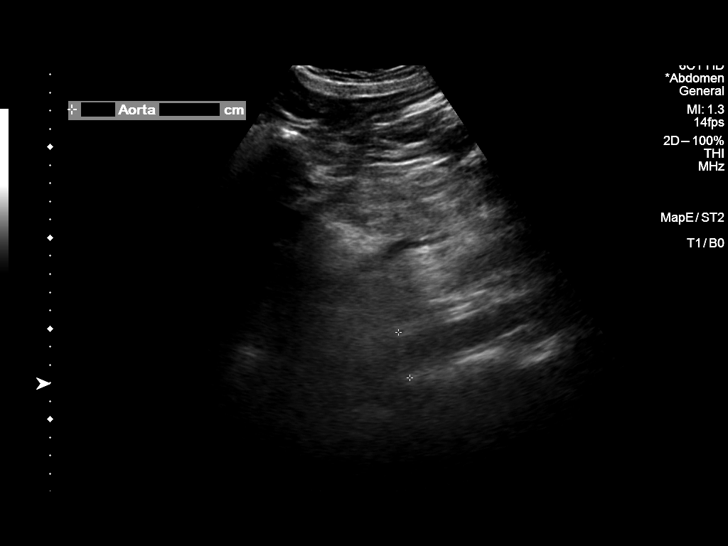
[im 86/86]
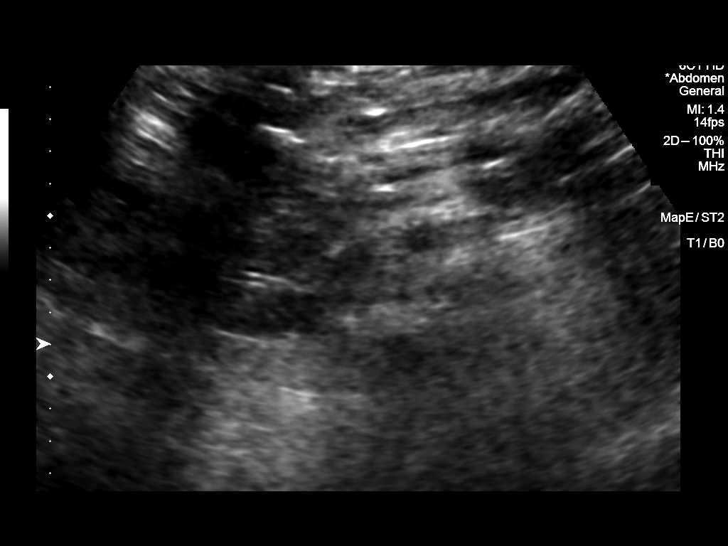

[14 of 25 positions shown; findings below may reference images not displayed]

FINDINGS: Gallbladder: No gallstones or wall thickening visualized. No
sonographic Murphy sign noted by sonographer.

Common bile duct: Diameter: 4 mm which is within normal limits.

Liver: No focal lesion identified. Increased echogenicity of hepatic
parenchyma is noted suggesting hepatic steatosis or other diffuse
hepatocellular disease. Portal vein is patent on color Doppler
imaging with normal direction of blood flow towards the liver.

IVC: No abnormality visualized.

Pancreas: Not well visualized due to overlying bowel gas.

Spleen: Size and appearance within normal limits.

Right Kidney: Length: 11.8 cm. Echogenicity within normal limits. No
mass or hydronephrosis visualized.

Left Kidney: Length: 10.7 cm. Echogenicity within normal limits. No
mass or hydronephrosis visualized.

Abdominal aorta: No aneurysm visualized.

Other findings: None.
IMPRESSION: Increased echogenicity of hepatic parenchyma is noted suggesting
hepatic steatosis or other diffuse hepatocellular disease.

## 2019-09-08 ENCOUNTER — Encounter: Payer: Self-pay | Admitting: Orthopaedic Surgery

## 2019-09-08 ENCOUNTER — Ambulatory Visit (INDEPENDENT_AMBULATORY_CARE_PROVIDER_SITE_OTHER): Payer: 59 | Admitting: Orthopaedic Surgery

## 2019-09-08 DIAGNOSIS — M25511 Pain in right shoulder: Secondary | ICD-10-CM | POA: Diagnosis not present

## 2019-09-08 MED ORDER — METHYLPREDNISOLONE ACETATE 40 MG/ML IJ SUSP
40.0000 mg | INTRAMUSCULAR | Status: AC | PRN
  Administered 2019-09-08: 40 mg via INTRA_ARTICULAR

## 2019-09-08 MED ORDER — TRAMADOL HCL 50 MG PO TABS: 50.0000 mg | ORAL_TABLET | Freq: Four times a day (QID) | ORAL | 0 refills | Status: DC | PRN

## 2019-09-08 MED ORDER — LIDOCAINE HCL 1 % IJ SOLN
3.0000 mL | INTRAMUSCULAR | Status: AC | PRN
  Administered 2019-09-08: 3 mL

## 2019-09-08 NOTE — Progress Notes (Signed)
Office Visit Note   Patient: Jacob Henson           Date of Birth: February 24, 1960           MRN: [id number] Visit Date: 09/08/2019              Requested by: Jacob Henson, [place],  [place] [id number] PCP: Jacob Beck, FNP   Assessment & Plan: Visit Diagnoses:  1. Acute pain of right shoulder     Plan: This definitely seems to be more of a right shoulder issue and a proximal biceps tendon issue as well.  I did recommend a steroid injection in the subacromial space of his right shoulder and he agreed with this.  I explained the risk and benefits of injections and he tolerated well.  Also placed an injection along the course of the biceps tendon where his most tender.  He understands that there is a rupture there I would not do anything about it clinically or surgically because this is more of something that is not needed because it does not change the strength of the shoulder to have a proximal biceps tendon rupture.  I still would like to see him back in 3 weeks because if he still having shoulder issues I would like to have 3 views of the right shoulder x-ray wise and would likely then order an MRI of the shoulder.  All question concerns were answered and addressed.  I did send in some tramadol for his pain at night.  Follow-Up Instructions: Return in about 3 years (around 09/08/2022).   Orders:  Orders Placed This Encounter  Procedures  . Large Joint Inj   Meds ordered this encounter  Medications  . traMADol (ULTRAM) 50 MG tablet    Sig: Take 1-2 tablets (50-100 mg total) by mouth every 6 (six) hours as needed.    Dispense:  40 tablet    Refill:  0      Procedures: Large Joint Inj: R subacromial bursa on 09/08/2019 4:46 PM Indications: pain and diagnostic evaluation Details: 22 G 1.5 in needle  Arthrogram: No  Medications: 3 mL lidocaine 1 %; 40 mg methylPREDNISolone acetate 40 MG/ML Outcome: tolerated well, no immediate  complications Procedure, treatment alternatives, risks and benefits explained, specific risks discussed. Consent was given by the patient. Immediately prior to procedure a time out was called to verify the correct patient, procedure, equipment, support staff and site/side marked as required. Patient was prepped and draped in the usual sterile fashion.       Clinical Data: No additional findings.   Subjective: Chief Complaint  Patient presents with  . Right Upper Arm - Pain  The patient is a very pleasant 59 year old gentleman who comes in for evaluation treatment of right upper arm pain.  His primary care physician feels like this may be a proximal biceps tendon issue and based on what he is saying that certainly may be the case.  He injured the shoulder about a month or so ago when he was pulling something at the time.  He has had problems with reaching behind him with overhead activity since then.  Has been taking anti-inflammatories but he is worried about the effects on his kidneys her blood pressure as well as his GI tract.  He said no swelling or bruising.  It hurts to reach behind him and overhead and his range of motion is starting to get limited due to this pain that  is experiencing.  He points to more the mid humerus and proximal humerus a source of his pain and not at the elbow itself.  He is never had surgery on the shoulder never injured it before.  He is not a diabetic.  He is right-hand dominant and this is his right side.  HPI  Review of Systems There is currently no reported headache, chest pain, shortness of breath, fever, chills, nausea, vomiting  Objective: Vital Signs: There were no vitals taken for this visit.  Physical Exam He is alert and orient x3 and in no acute distress Ortho Exam Examination of his right shoulder shows positive signs of impingement.  His internal rotation with adduction is significantly limited.  He has pain along the course of the proximal  biceps tendon but not at the distal biceps tendon.  He has good strength in the rotator cuff and the biceps in general. Specialty Comments:  No specialty comments available.  Imaging: No results found.   PMFS History: There are no problems to display for this patient.  Past Medical History:  Diagnosis Date  . Allergy   . GERD (gastroesophageal reflux disease)    Has required esophageal dilation in past  . Hypertension     Family History  Problem Relation Age of Onset  . Heart disease Father   . Hearing loss Father   . Diabetes Father   . Heart disease Paternal Grandmother   . Diabetes Paternal Grandmother   . Heart attack Paternal Grandfather     Past Surgical History:  Procedure Laterality Date  . ESOPHAGEAL DILATION     Social History   Occupational History  . Occupation: full time  Tobacco Use  . Smoking status: Former Smoker    Packs/day: 1.00    Years: 4.00    Pack years: 4.00    Types: Cigarettes    Quit date: 10/14/1978    Years since quitting: 40.9  . Smokeless tobacco: Never Used  Vaping Use  . Vaping Use: Never used  Substance and Sexual Activity  . Alcohol use: No  . Drug use: Never  . Sexual activity: Not on file

## 2019-09-10 ENCOUNTER — Other Ambulatory Visit: Payer: 59

## 2019-09-29 ENCOUNTER — Ambulatory Visit: Payer: 59 | Admitting: Orthopaedic Surgery

## 2019-10-06 ENCOUNTER — Ambulatory Visit (INDEPENDENT_AMBULATORY_CARE_PROVIDER_SITE_OTHER): Payer: 59

## 2019-10-06 ENCOUNTER — Ambulatory Visit (INDEPENDENT_AMBULATORY_CARE_PROVIDER_SITE_OTHER): Payer: 59 | Admitting: Orthopaedic Surgery

## 2019-10-06 DIAGNOSIS — M25562 Pain in left knee: Secondary | ICD-10-CM

## 2019-10-06 DIAGNOSIS — M25511 Pain in right shoulder: Secondary | ICD-10-CM | POA: Diagnosis not present

## 2019-10-06 DIAGNOSIS — G8929 Other chronic pain: Secondary | ICD-10-CM

## 2019-10-06 MED ORDER — LIDOCAINE HCL 1 % IJ SOLN
3.0000 mL | INTRAMUSCULAR | Status: AC | PRN
  Administered 2019-10-06: 3 mL

## 2019-10-06 MED ORDER — METHYLPREDNISOLONE ACETATE 40 MG/ML IJ SUSP
40.0000 mg | INTRAMUSCULAR | Status: AC | PRN
  Administered 2019-10-06: 40 mg via INTRA_ARTICULAR

## 2019-10-06 NOTE — Progress Notes (Signed)
Office Visit Note   Patient: Jacob Henson           Date of Birth: December 17, 1960           MRN: [id number] Visit Date: 10/06/2019              Requested by: Elby Beck, [place],   [id number] PCP: Elby Beck, FNP   Assessment & Plan: Visit Diagnoses:  1. Chronic right shoulder pain   2. Acute pain of left knee     Plan: At this point, I did place a steroid injection in his left knee and do feel MRIs are warranted of the right shoulder to rule out a rotator cuff tear in the left knee to rule out a meniscal tear.  He agrees with this treatment plan.  Follow-up will be after these MRIs.  Follow-Up Instructions: Return in about 2 weeks (around 10/20/2019).   Orders:  Orders Placed This Encounter  Procedures   Large Joint Inj: L knee   XR Shoulder Right   XR Knee 1-2 Views Left   No orders of the defined types were placed in this encounter.     Procedures: Large Joint Inj: L knee on 10/06/2019 4:13 PM Indications: diagnostic evaluation and pain Details: 22 G 1.5 in needle, superolateral approach  Arthrogram: No  Medications: 3 mL lidocaine 1 %; 40 mg methylPREDNISolone acetate 40 MG/ML Outcome: tolerated well, no immediate complications Procedure, treatment alternatives, risks and benefits explained, specific risks discussed. Consent was given by the patient. Immediately prior to procedure a time out was called to verify the correct patient, procedure, equipment, support staff and site/side marked as required. Patient was prepped and draped in the usual sterile fashion.       Clinical Data: No additional findings.   Subjective: Chief Complaint  Patient presents with   Left Shoulder - Pain   Right Knee - Pain  The patient is well-known to me.  He comes in with 2 chief complaints today.  I have actually been seeing him for his right shoulder and we provided injection of the shoulder recently and this really only helped  his symptoms minimally.  He still has a lot of pain with overhead activities and reaching behind him and is gotten more severe.  He is now more recently developed left knee pain with locking and catching after twisting that knee.  He has had a hard time putting weight on his knee as well and putting pressure on it.  He does feel like the knee is unstable on the left side and its giving way on him.  HPI  Review of Systems He currently denies any headache, chest pain, shortness of breath, fever, chills, nausea, vomiting  Objective: Vital Signs: There were no vitals taken for this visit.  Physical Exam He is alert and oriented x3 and in no acute distress Ortho Exam Examination of his right shoulder shows severe signs of impingement of that shoulder with weakness of the rotator cuff.  Examination of his left knee does show mild effusion with significant emergency exam to the lateral compartment of the knee. Specialty Comments:  No specialty comments available.  Imaging: XR Knee 1-2 Views Left  Result Date: 10/06/2019 2 views of the left knee show no acute findings.  XR Shoulder Right  Result Date: 10/06/2019 3 views of the right shoulder shows significant AC joint arthritic changes and some mild glenohumeral arthritic changes.  The shoulder is  otherwise located.    PMFS History: There are no problems to display for this patient.  Past Medical History:  Diagnosis Date   Allergy    GERD (gastroesophageal reflux disease)    Has required esophageal dilation in past   Hypertension     Family History  Problem Relation Age of Onset   Heart disease Father    Hearing loss Father    Diabetes Father    Heart disease Paternal Grandmother    Diabetes Paternal Grandmother    Heart attack Paternal Grandfather     Past Surgical History:  Procedure Laterality Date   ESOPHAGEAL DILATION     Social History   Occupational History   Occupation: full time  Tobacco Use    Smoking status: Former Smoker    Packs/day: 1.00    Years: 4.00    Pack years: 4.00    Types: Cigarettes    Quit date: 10/14/1978    Years since quitting: 41.0   Smokeless tobacco: Never Used  Vaping Use   Vaping Use: Never used  Substance and Sexual Activity   Alcohol use: No   Drug use: Never   Sexual activity: Not on file

## 2019-10-07 ENCOUNTER — Other Ambulatory Visit: Payer: Self-pay

## 2019-10-07 DIAGNOSIS — Z96611 Presence of right artificial shoulder joint: Secondary | ICD-10-CM

## 2019-10-07 DIAGNOSIS — M25511 Pain in right shoulder: Secondary | ICD-10-CM

## 2019-10-07 DIAGNOSIS — G8929 Other chronic pain: Secondary | ICD-10-CM

## 2019-10-07 DIAGNOSIS — M25562 Pain in left knee: Secondary | ICD-10-CM

## 2019-10-13 ENCOUNTER — Other Ambulatory Visit: Payer: Self-pay | Admitting: Physician Assistant

## 2019-10-13 DIAGNOSIS — R1013 Epigastric pain: Secondary | ICD-10-CM

## 2019-10-13 LAB — CBC AND DIFFERENTIAL
HCT: 46 (ref 41–53)
Hemoglobin: 15.8 (ref 13.5–17.5)
WBC: 9

## 2019-10-13 LAB — COMPREHENSIVE METABOLIC PANEL
Calcium: 9.9 (ref 8.7–10.7)
Calcium: 9.9 (ref 8.7–10.7)
GFR calc Af Amer: 44
GFR calc Af Amer: 44
GFR calc non Af Amer: 36
GFR calc non Af Amer: 36

## 2019-10-13 LAB — CBC: RBC: 4.96 (ref 3.87–5.11)

## 2019-10-13 LAB — BASIC METABOLIC PANEL
BUN: 30 — AB (ref 4–21)
BUN: 30 — AB (ref 4–21)
CO2: 30 — AB (ref 13–22)
CO2: 30 — AB (ref 13–22)
Chloride: 98 — AB (ref 99–108)
Chloride: 98 — AB (ref 99–108)
Creatinine: 1.9 — AB (ref 0.6–1.3)
Creatinine: 1.9 — AB (ref 0.6–1.3)
Glucose: 92
Glucose: 92
Potassium: 4.5 (ref 3.4–5.3)
Potassium: 4.5 (ref 3.4–5.3)
Sodium: 135 — AB (ref 137–147)
Sodium: 135 — AB (ref 137–147)

## 2019-10-14 ENCOUNTER — Other Ambulatory Visit: Payer: Self-pay | Admitting: Orthopaedic Surgery

## 2019-10-16 ENCOUNTER — Other Ambulatory Visit: Payer: Self-pay

## 2019-10-16 ENCOUNTER — Ambulatory Visit
Admission: RE | Admit: 2019-10-16 | Discharge: 2019-10-16 | Disposition: A | Discharge status: Home / Self Care | Payer: 59 | Source: Ambulatory Visit | Attending: Orthopaedic Surgery | Admitting: Orthopaedic Surgery

## 2019-10-16 DIAGNOSIS — G8929 Other chronic pain: Secondary | ICD-10-CM

## 2019-10-16 DIAGNOSIS — Z96611 Presence of right artificial shoulder joint: Secondary | ICD-10-CM

## 2019-10-16 IMAGING — MR MR SHOULDER*R* W/O CM
4 of 5 series · 15 of 40 positions shown · non-contrast
Comparison: Radiographs [DATE]

CLINICAL DATA: Right upper arm and shoulder pain for 2.5 months.
Temporary relief from cortisone injection. No previous relevant
surgery. No given history of malignancy.

EXAM:
MRI OF THE RIGHT SHOULDER WITHOUT CONTRAST
TECHNIQUE: Multiplanar, multisequence MR imaging of the shoulder was performed.
No intravenous contrast was administered.

[Series 9: T2 fat-sat · oblique · right · 4.0mm · 0.22mm/px · 3 of 25 slices shown (1 of 2)]
[im 4/25]
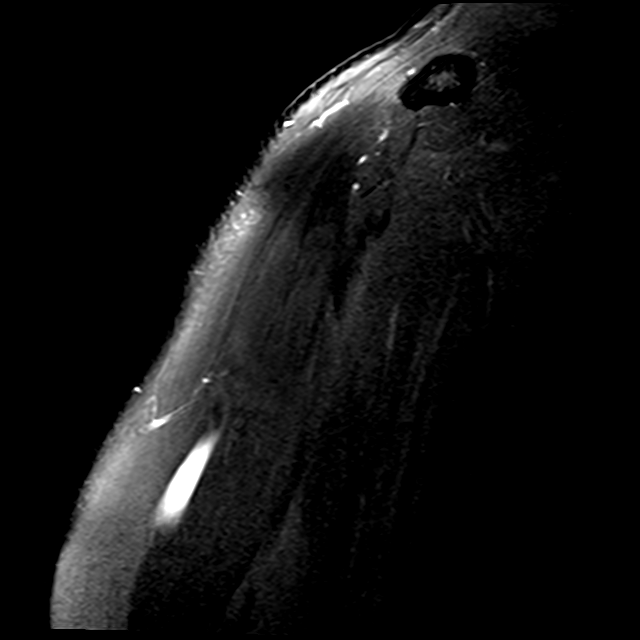
[im 14/25]
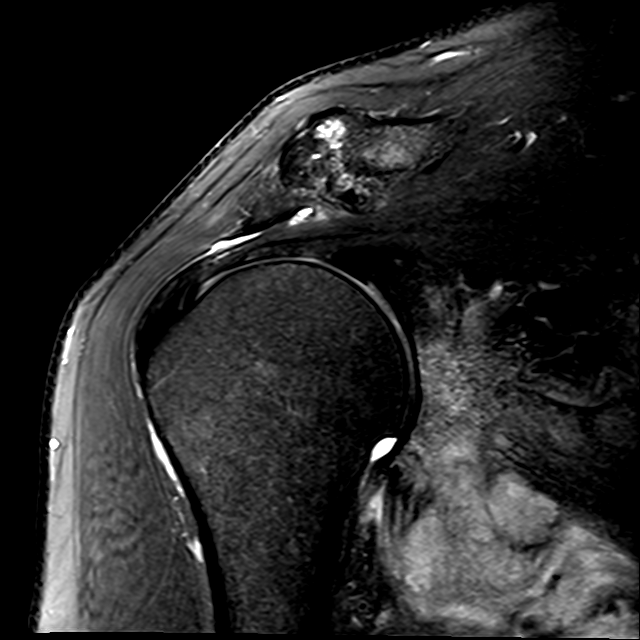
[im 21/25]
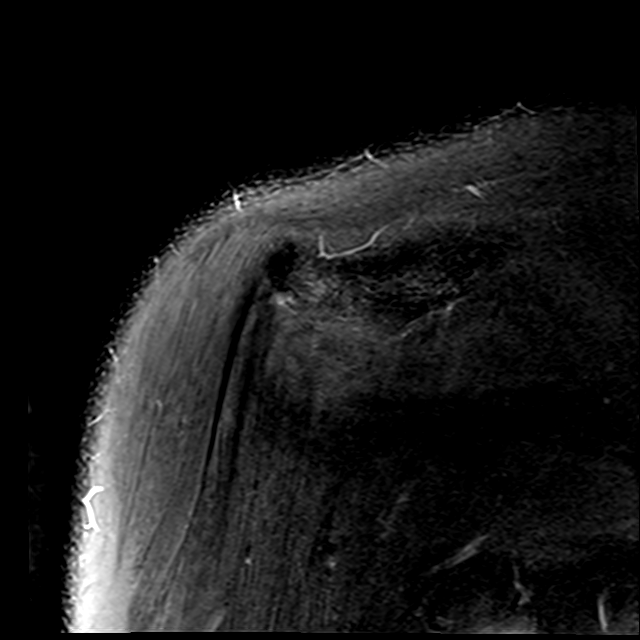

[Series 10: PD fat-sat · oblique · right · 4.0mm · 0.22mm/px · 6 of 25 slices shown (1 of 2)]
[im 1/25]
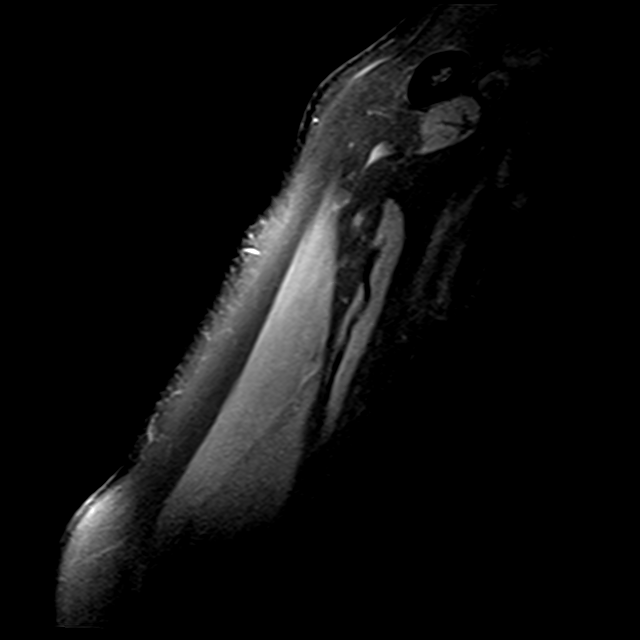
[im 5/25]
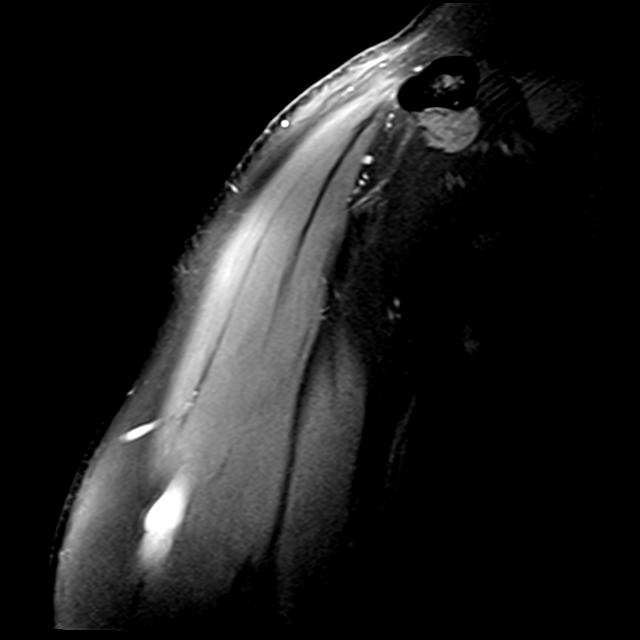
[im 9/25]
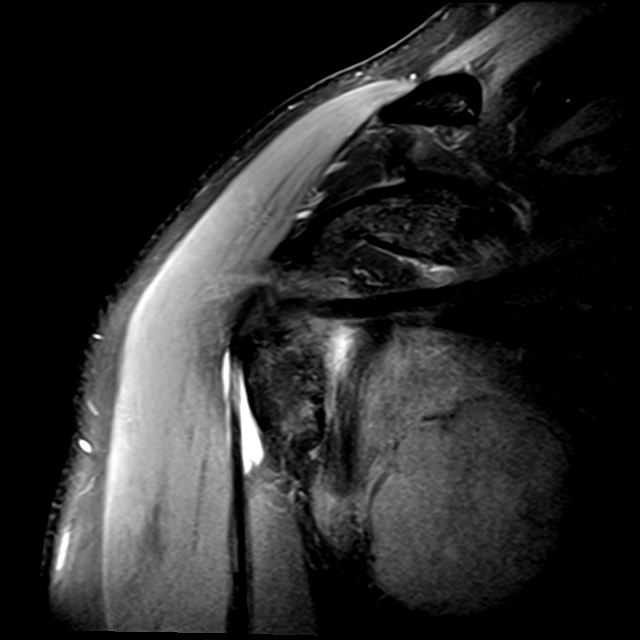
[im 13/25]
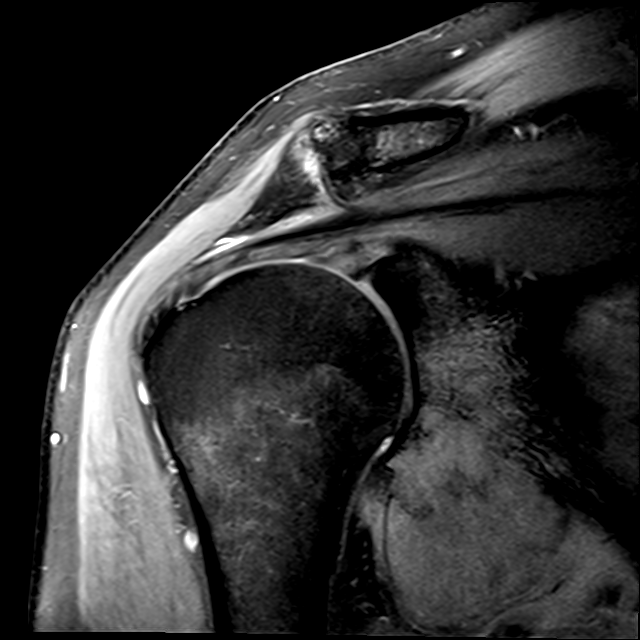
[im 17/25]
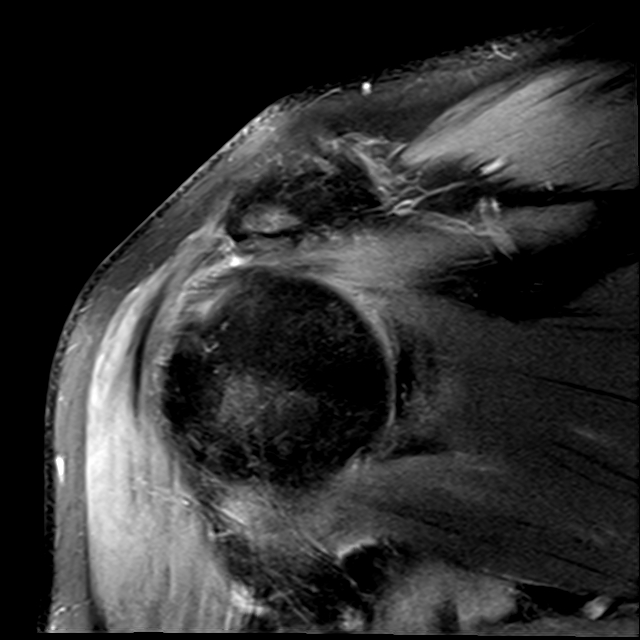
[im 21/25]
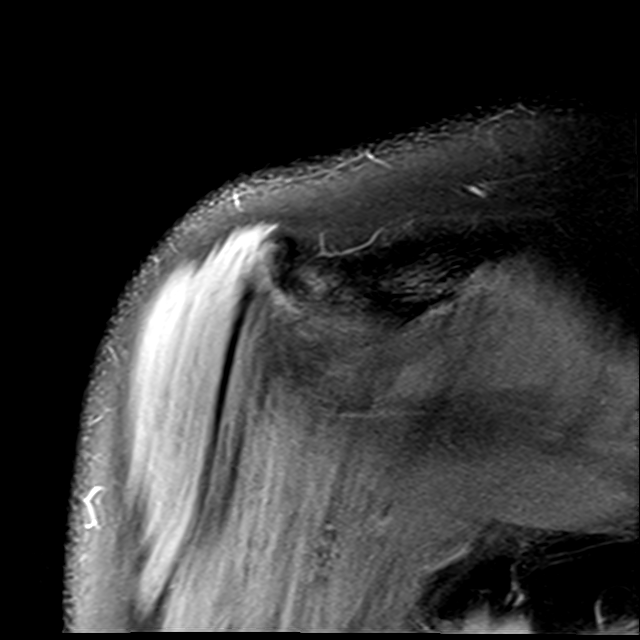

[Series 11: T2 fat-sat · oblique · right · 4.0mm · 0.44mm/px · 3 of 27 slices shown (2 of 2)]
[im 4/27]
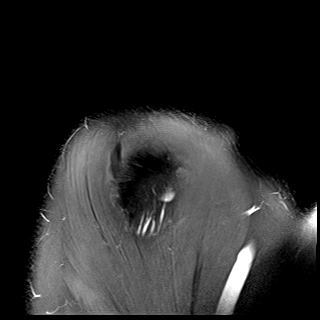
[im 15/27]
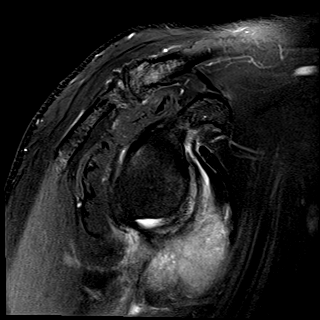
[im 23/27]
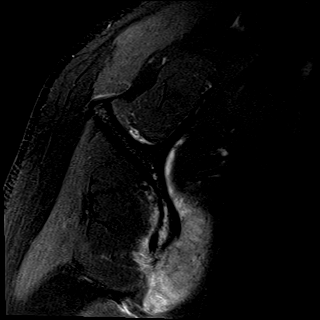

[Series 13: PD fat-sat · oblique · right · 4.0mm · 0.22mm/px · 3 of 30 slices shown (2 of 2)]
[im 4/30]
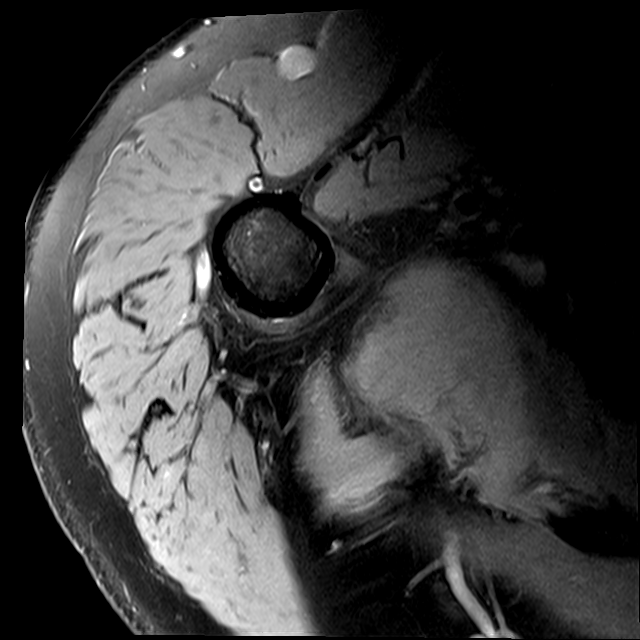
[im 15/30]
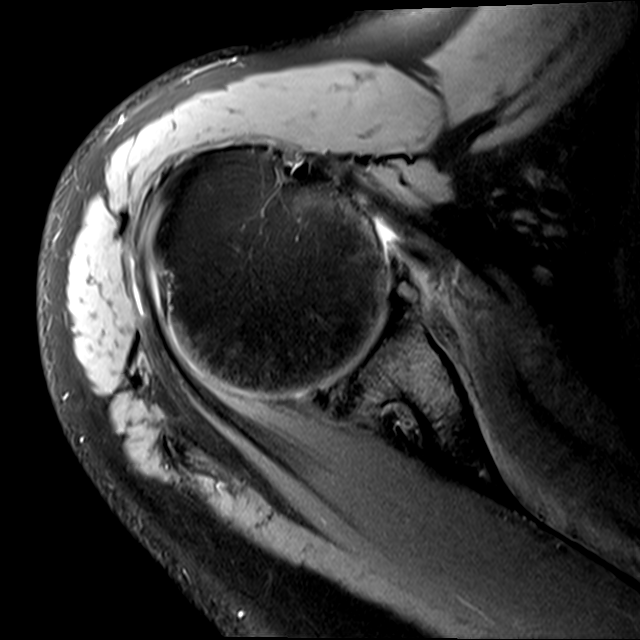
[im 26/30]
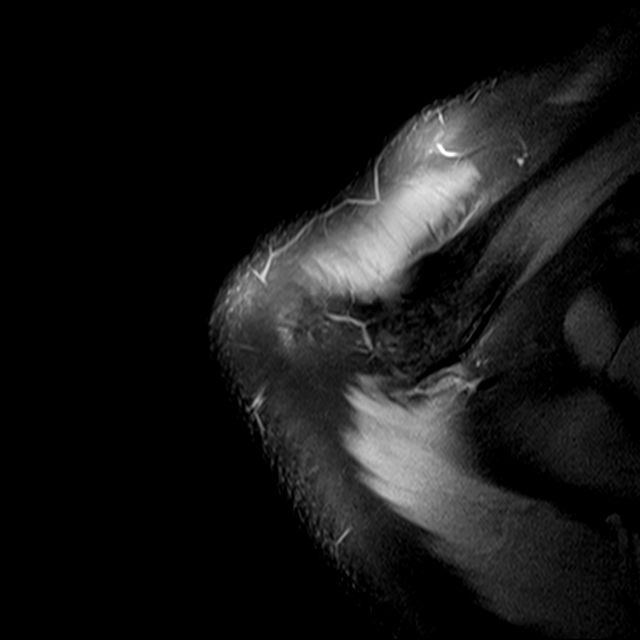

[15 of 40 positions shown; findings below may reference images not displayed]

FINDINGS: Rotator cuff: Mild supraspinatus and infraspinatus tendinosis
without evidence of tear. The subscapularis and teres minor tendons
appear normal.

Muscles: There is T2 hyperintensity surrounding a large right
scapular mass, further described below. No significant intramuscular
edema or atrophy.

Biceps long head:  Intact and normally positioned.

Acromioclavicular Joint: The acromion is type 1. There are moderate
acromioclavicular degenerative changes and a small amount of fluid
in the subacromial-subdeltoid bursa.

Glenohumeral Joint: Mild glenohumeral degenerative changes. No
significant shoulder joint effusion.

Labrum:  No evidence of labral tear or paralabral cyst.

Bones: There is a large exophytic mass arising from the anterior
inferior aspect of the scapular body and glenoid. This measures
x 4.5 x 3.9 cm and demonstrates heterogeneous T2 hyperintensity and
lobulated margins. There is surrounding soft tissue edema. This has
a soft tissue component which extends anteriorly into right axilla.
No pathologic fracture or other osseous lesions are seen.The
scapular lesion is not readily apparent on recent radiographs.

Other: As above, soft tissue edema surrounding the right scapular
mass without focal fluid collection.
IMPRESSION: 1. Large exophytic mass arising from the anterior inferior aspect of
the right scapular body and glenoid consistent with malignancy.
Major differential considerations include metastatic disease,
plasmacytoma and chondrosarcoma. CT may be helpful for further
evaluation. Tissue sampling recommended.
2. Mild supraspinatus and infraspinatus tendinosis without evidence
of tear.
3. Moderate acromioclavicular degenerative changes and mild
subacromial-subdeltoid bursitis.
4. These results will be called to the ordering clinician or
representative by the Radiologist Assistant, and communication
documented in the PACS or [REDACTED].

## 2019-10-16 IMAGING — MR MR KNEE*L* W/O CM
6 series · 38 of 40 positions shown · non-contrast
Comparison: Radiographs [DATE].

CLINICAL DATA: Left knee pain for approximately 10 weeks.
Intermittent relief from cortisone injection. No previous relevant
surgery.

EXAM:
MRI OF THE LEFT KNEE WITHOUT CONTRAST
TECHNIQUE: Multiplanar, multisequence MR imaging of the knee was performed. No
intravenous contrast was administered.

[Series 6: T2 fat-sat · axial · left · 4.0mm · 0.50mm/px · z∈[-61,+118]mm · 9 of 42 slices shown (1 of 3)]
[im 1/42]
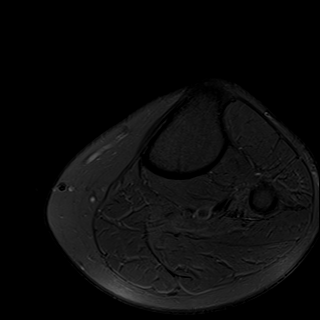
[im 6/42]
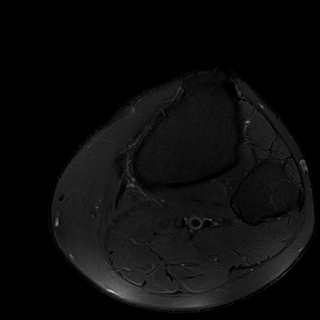
[im 11/42]
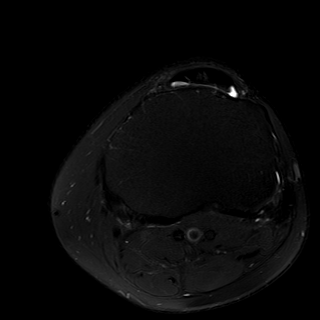
[im 16/42]
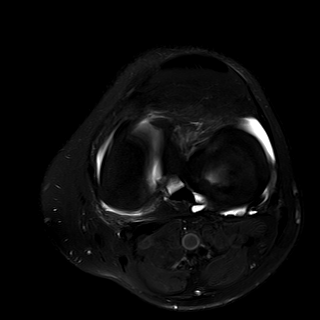
[im 21/42]
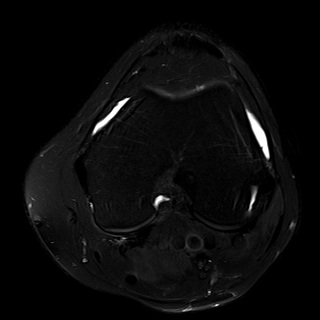
[im 26/42]
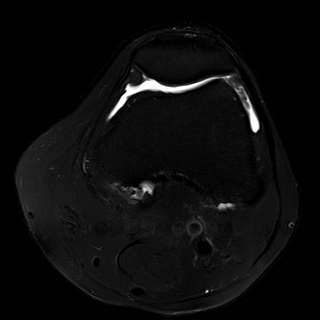
[im 31/42]
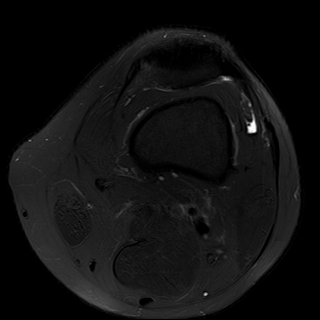
[im 36/42]
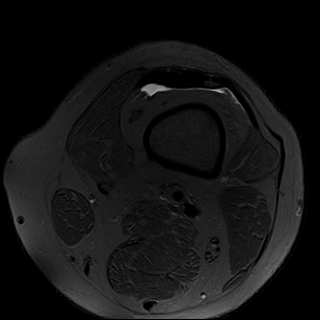
[im 42/42]
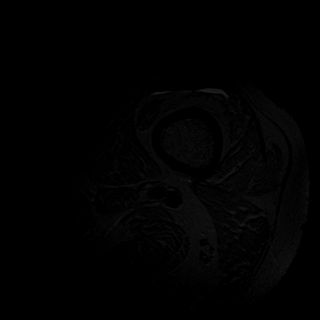

[Series 7: T2 fat-sat · coronal · left · 4.0mm · 0.42mm/px · 6 of 30 slices shown (2 of 3)]
[im 1/30]
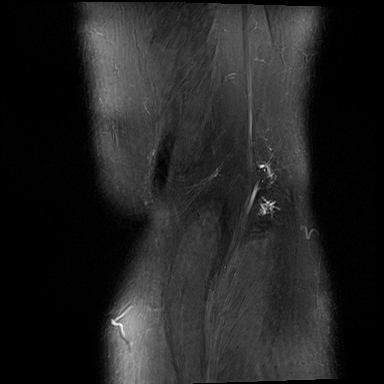
[im 6/30]
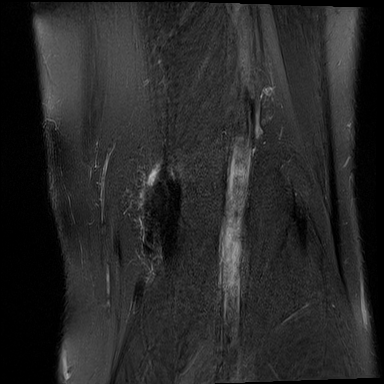
[im 12/30]
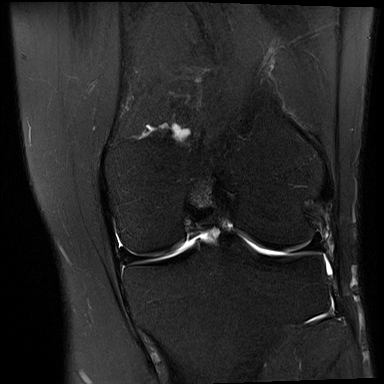
[im 18/30]
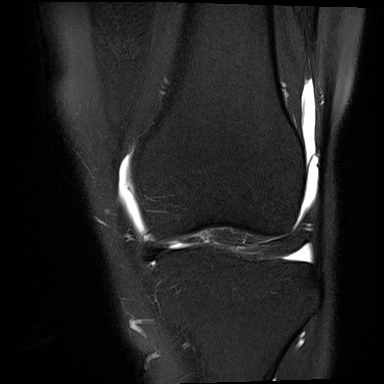
[im 24/30]
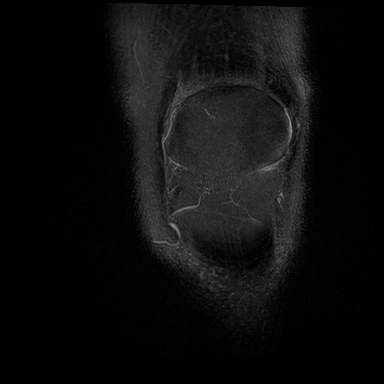
[im 30/30]
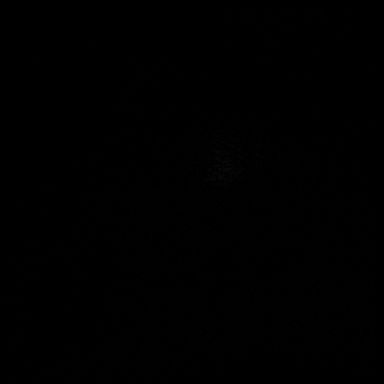

[Series 8: T1 · coronal · left · 4.0mm · 0.42mm/px · 4 of 30 slices shown]
[im 1/30]
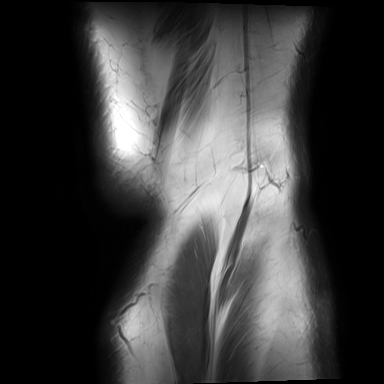
[im 6/30]
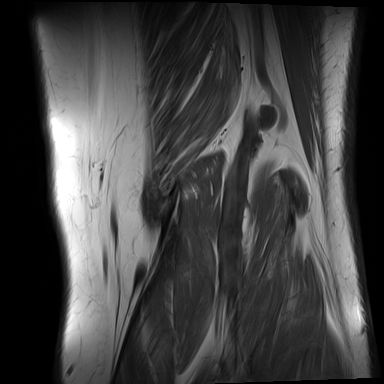
[im 12/30]
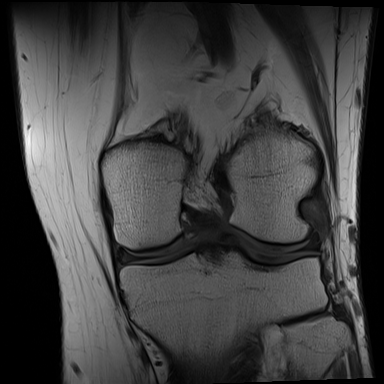
[im 18/30]
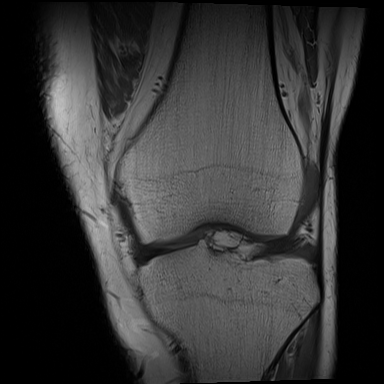

[Series 10: PD fat-sat · sagittal · left · 3.0mm · 0.42mm/px · 6 of 33 slices shown (1 of 2)]
[im 1/33]
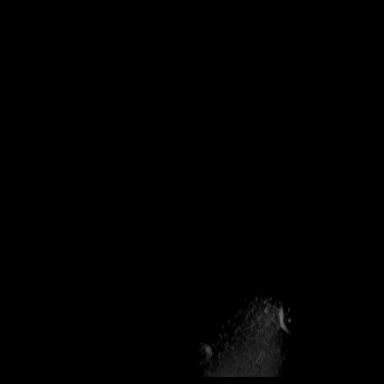
[im 7/33]
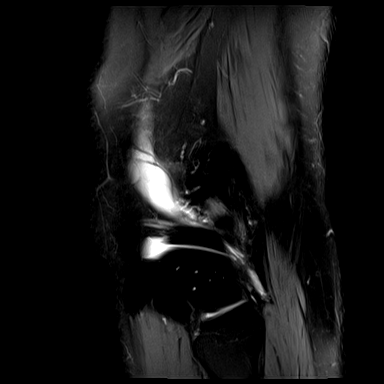
[im 13/33]
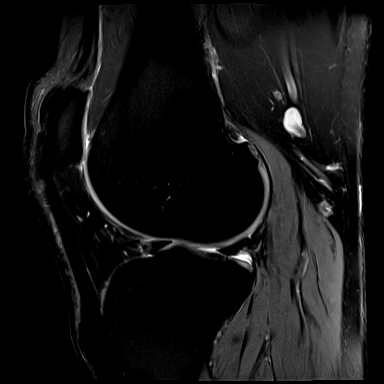
[im 20/33]
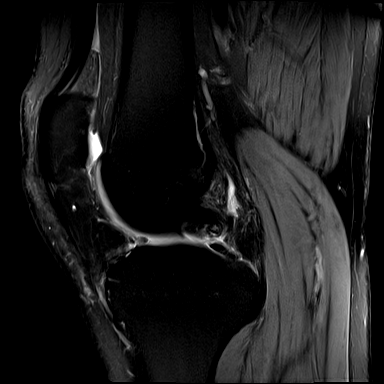
[im 26/33]
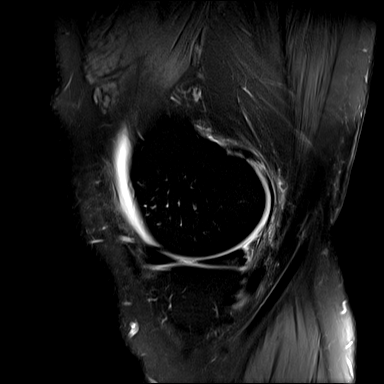
[im 33/33]
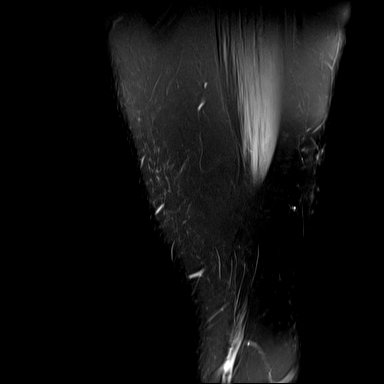

[Series 11: T2 fat-sat · sagittal · left · 3.0mm · 0.42mm/px · 6 of 33 slices shown (3 of 3)]
[im 1/33]
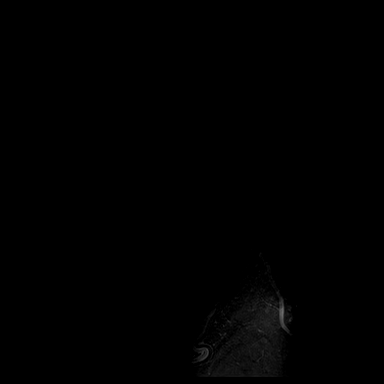
[im 7/33]
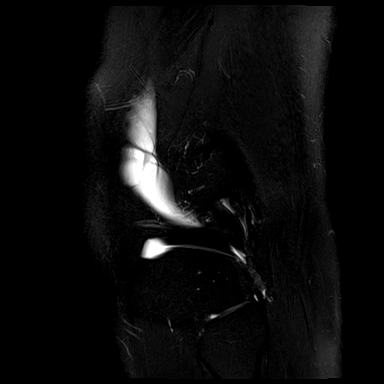
[im 13/33]
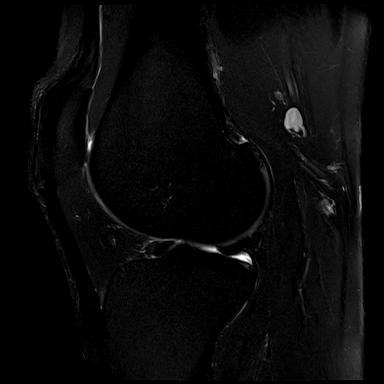
[im 20/33]
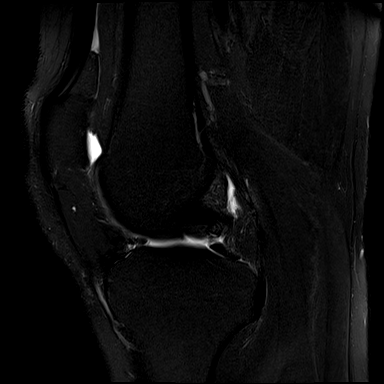
[im 26/33]
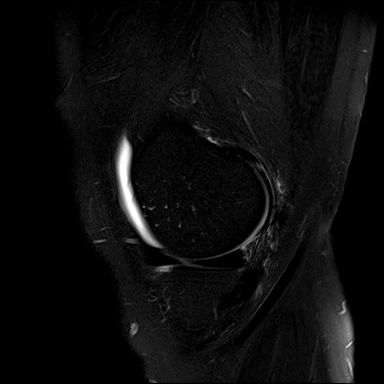
[im 33/33]
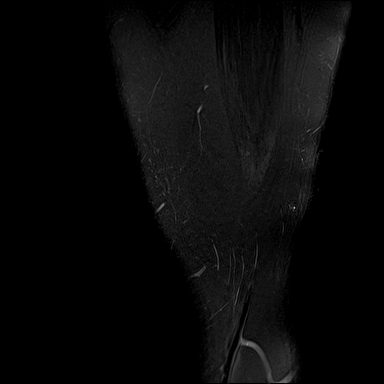

[Series 12: PD fat-sat · coronal · left · 3.0mm · 0.47mm/px · 7 of 36 slices shown (2 of 2)]
[im 1/36]
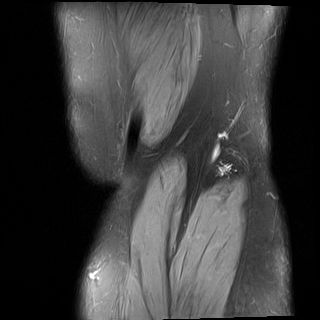
[im 6/36]
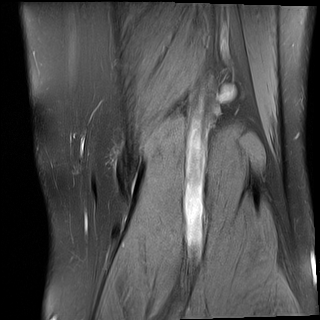
[im 12/36]
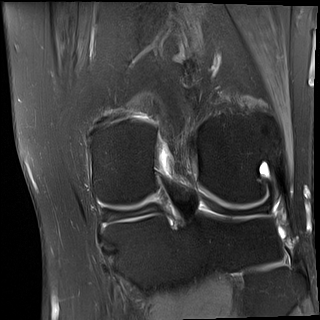
[im 18/36]
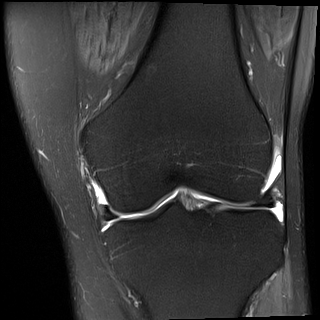
[im 24/36]
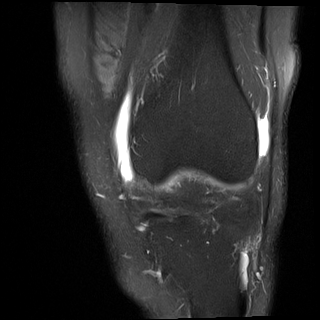
[im 30/36]
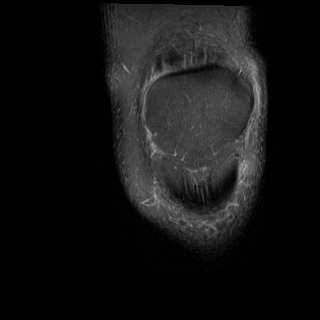
[im 36/36]
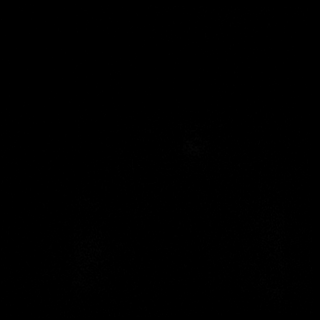

[38 of 40 positions shown; findings below may reference images not displayed]

FINDINGS: MENISCI

Medial meniscus:  Intact with normal morphology.

Lateral meniscus:  Intact with normal morphology.

LIGAMENTS

Cruciates:  Intact.

Collaterals:  Intact.

CARTILAGE

Patellofemoral:  Mild chondral thinning without focal defect.

Medial: Mild chondral thinning and surface irregularity. No focal
defect.

Lateral: Mild chondral thinning and surface irregularity. No focal
defect.

MISCELLANEOUS

Joint:  No significant joint effusion.

Popliteal Fossa:  Unremarkable. No significant Baker's cyst.

Extensor Mechanism: Intact. There is mild proximal and distal
patellar tendinosis with associated enthesophytes, but no tear.

Bones:  No acute or significant extra-articular osseous findings.

Other: No other significant periarticular soft tissue findings.
IMPRESSION: 1. No acute findings or evidence of internal derangement. The
menisci, cruciate and collateral ligaments are intact.
2. Mild tricompartmental degenerative changes. No acute osseous
findings.
3. Mild proximal and distal patellar tendinosis with associated
enthesophytes.

## 2019-10-19 ENCOUNTER — Other Ambulatory Visit: Payer: Self-pay

## 2019-10-19 ENCOUNTER — Telehealth: Payer: Self-pay

## 2019-10-19 DIAGNOSIS — M25519 Pain in unspecified shoulder: Secondary | ICD-10-CM

## 2019-10-19 NOTE — Telephone Encounter (Signed)
Cheryl with [facility] called wanted to make sure that you were aware of the MRI shoulder results.

## 2019-10-20 ENCOUNTER — Other Ambulatory Visit: Payer: Self-pay | Admitting: Physician Assistant

## 2019-10-20 ENCOUNTER — Ambulatory Visit (INDEPENDENT_AMBULATORY_CARE_PROVIDER_SITE_OTHER): Payer: 59 | Admitting: Orthopaedic Surgery

## 2019-10-20 ENCOUNTER — Other Ambulatory Visit (HOSPITAL_COMMUNITY): Payer: Self-pay | Admitting: Physician Assistant

## 2019-10-20 DIAGNOSIS — M25511 Pain in right shoulder: Secondary | ICD-10-CM

## 2019-10-20 DIAGNOSIS — R1013 Epigastric pain: Secondary | ICD-10-CM

## 2019-10-20 LAB — BASIC METABOLIC PANEL
BUN: 29 — AB (ref 4–21)
CO2: 29 — AB (ref 13–22)
Chloride: 94 — AB (ref 99–108)
Creatinine: 2.4 — AB (ref 0.6–1.3)
Glucose: 124
Potassium: 4.9 (ref 3.4–5.3)
Sodium: 134 — AB (ref 137–147)

## 2019-10-20 LAB — COMPREHENSIVE METABOLIC PANEL
Calcium: 10.2 (ref 8.7–10.7)
GFR calc Af Amer: 33
GFR calc non Af Amer: 28

## 2019-10-20 MED ORDER — HYDROCODONE-ACETAMINOPHEN 5-325 MG PO TABS: 1.0000 | ORAL_TABLET | Freq: Four times a day (QID) | ORAL | 0 refills | Status: DC | PRN

## 2019-10-20 NOTE — Progress Notes (Signed)
I did speak with the patient yesterday and he came to the office today so I could go over the MRI with him of his right shoulder. He developed acute shoulder pain a few months ago after having what he felt was just an injury to the shoulder. I then saw him in place a steroid injection in the shoulder and x-rayed the shoulder. There is definitely some AC joint arthritic changes and signs of impingement. However, due to continued pain I did send him for an MRI of the shoulder. Surprisingly MRI does show a large mass in the shoulder area on the right side consistent with potentially even a sarcoma such as a chondrosarcoma. We are working on getting him a referral over to [facility] for further evaluation treatment of this area. I did review the plain films with him and went over the MRI with the patient and his wife today. On inspection of the shoulder he cannot see a clearly defined macerated feeling mass in the shoulder but is definitely painful on the right side. I have advised him to go by [facility] imaging today to get the disc with the MRI and I gave him a copy of the report. I answered all the questions that I could his best as possible. He understands that they would likely need a tissue sample through a biopsy to better ascertain what is being treated. Since he is having enough pain, I will start him on hydrocodone. He will also need to be out of work completely until further notice. He is also dealing with some GI issues and apparently has a CT scan of his abdomen tomorrow.

## 2019-10-21 ENCOUNTER — Ambulatory Visit
Admission: RE | Admit: 2019-10-21 | Discharge: 2019-10-21 | Disposition: A | Discharge status: Home / Self Care | Payer: 59 | Source: Ambulatory Visit | Attending: Physician Assistant | Admitting: Physician Assistant

## 2019-10-21 ENCOUNTER — Telehealth: Payer: Self-pay | Admitting: Orthopaedic Surgery

## 2019-10-21 DIAGNOSIS — R1013 Epigastric pain: Secondary | ICD-10-CM

## 2019-10-21 IMAGING — CT CT ABD-PELV W/O CM
2 of 4 series · 16 of 46 positions shown, 18 images · non-contrast
Comparison: Abdominal ultrasound dated [DATE].

CLINICAL DATA: Abdominal pain and 60 pound weight loss for the past
3 months. Right scapular mass recently found on MRI this week.

EXAM:
CT ABDOMEN AND PELVIS WITHOUT CONTRAST
TECHNIQUE: Multidetector CT imaging of the abdomen and pelvis was performed
following the standard protocol without IV contrast.

[Series 2: routine abdomen pelvis without 5.00 br40 s3 axial · axial · non-contrast · 0.93mm/px · z∈[+1164,+1664]mm · 13 of 110 slices shown, 15 images]
[im 5/110  soft-tissue]
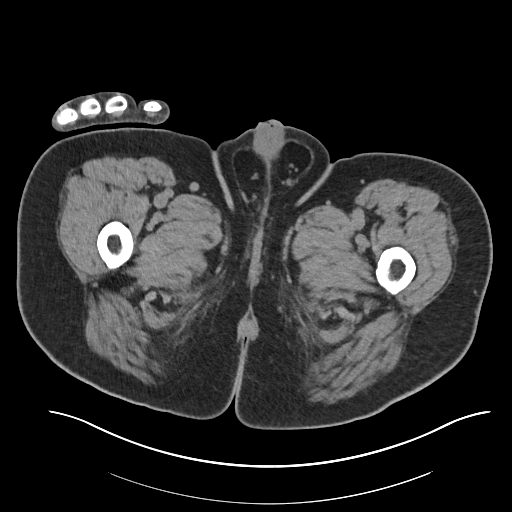
[im 5/110  bone]
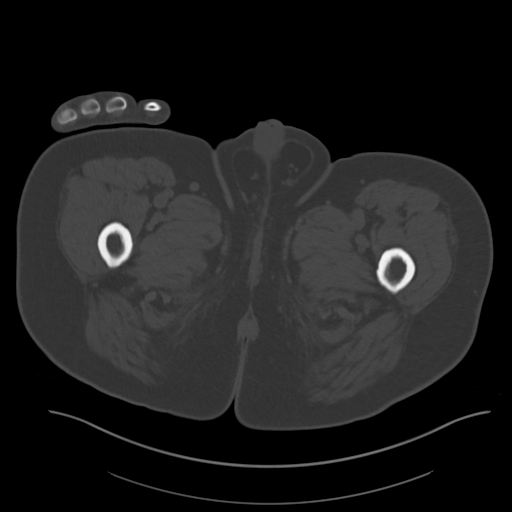
[im 15/110  soft-tissue]
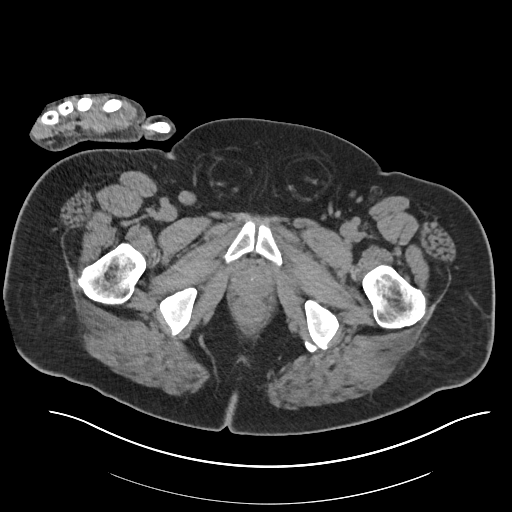
[im 25/110  soft-tissue]
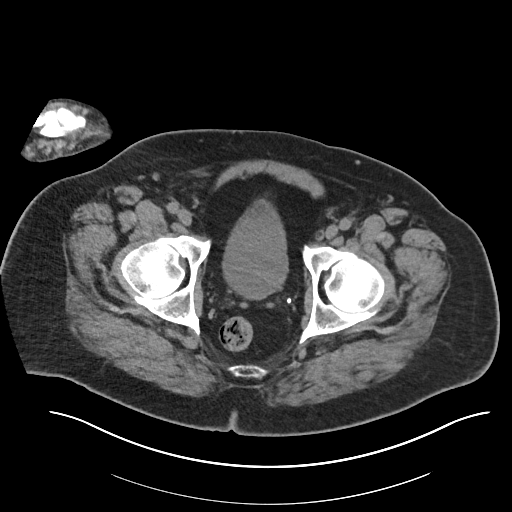
[im 30/110  soft-tissue]
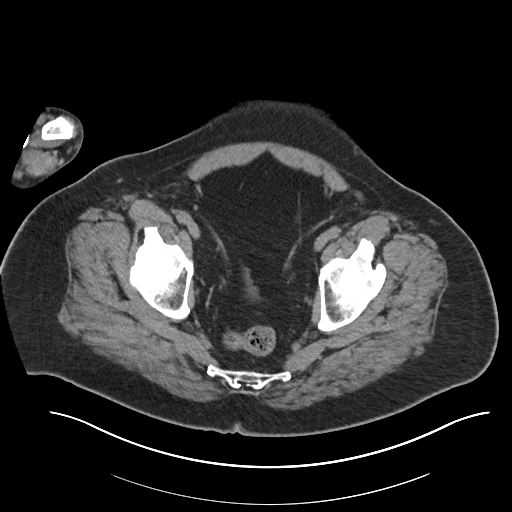
[im 40/110  soft-tissue]
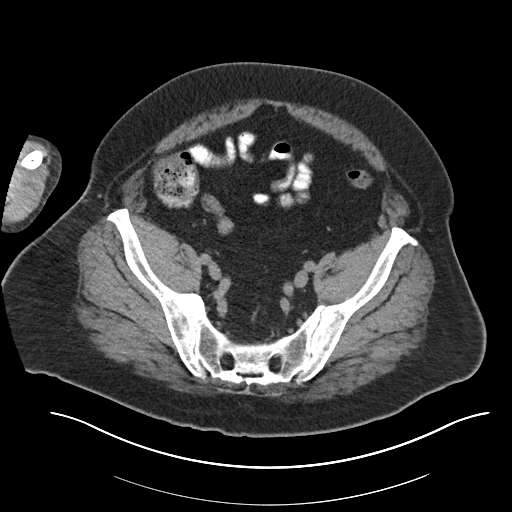
[im 45/110  soft-tissue]
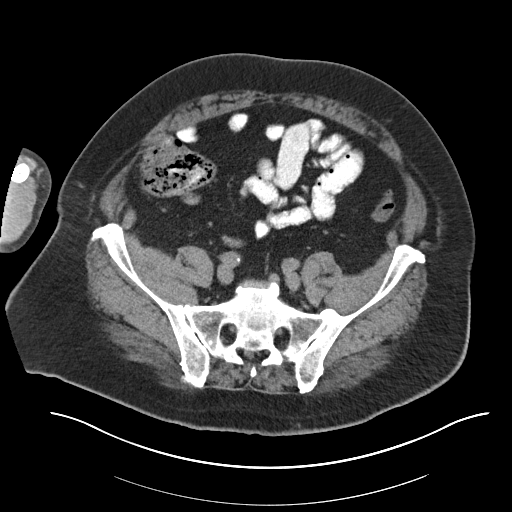
[im 55/110  soft-tissue]
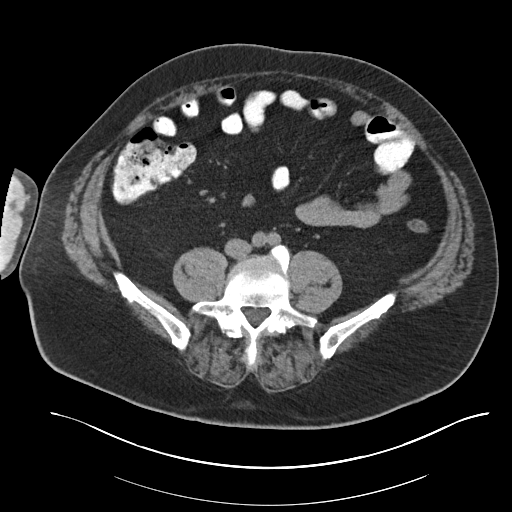
[im 65/110  soft-tissue]
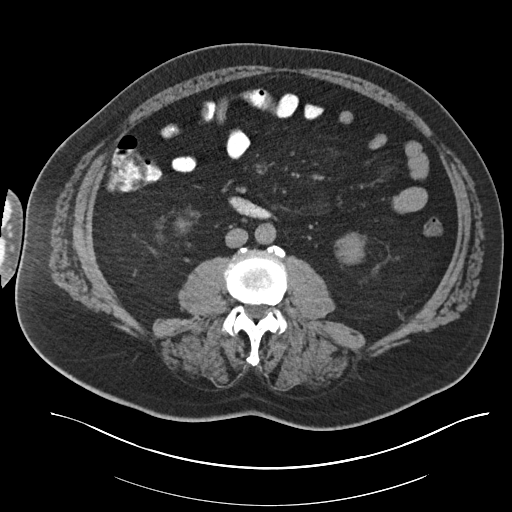
[im 70/110  soft-tissue]
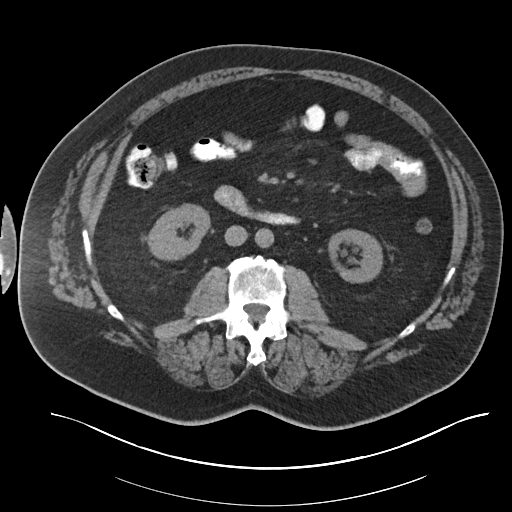
[im 70/110  bone]
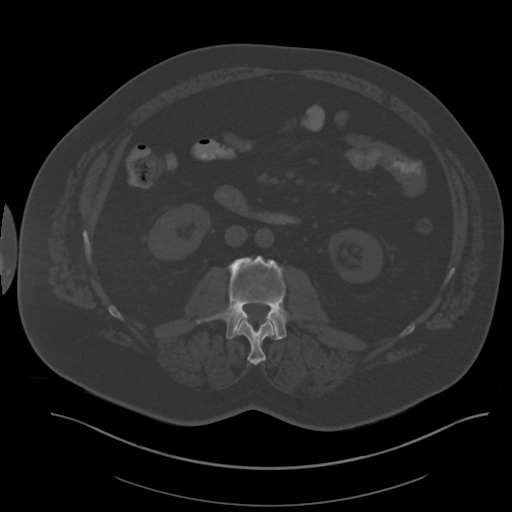
[im 80/110  soft-tissue]
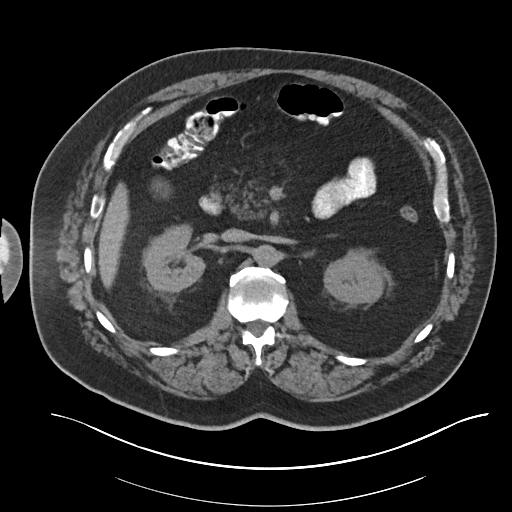
[im 85/110  soft-tissue]
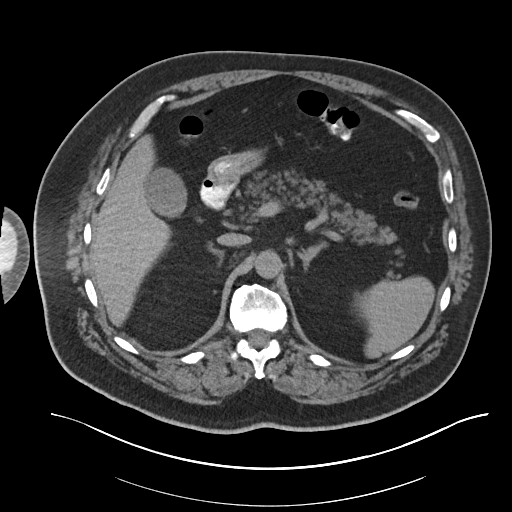
[im 95/110  soft-tissue]
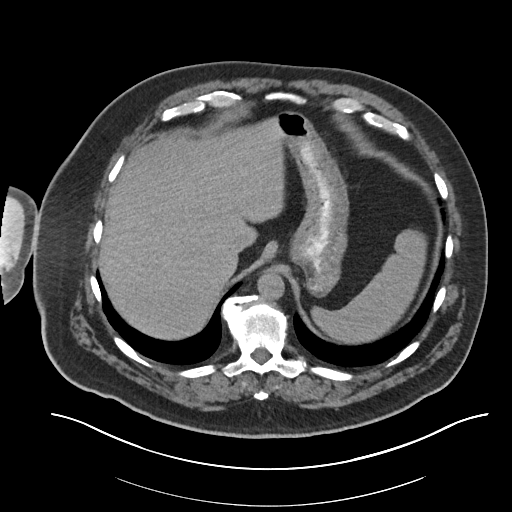
[im 105/110  soft-tissue]
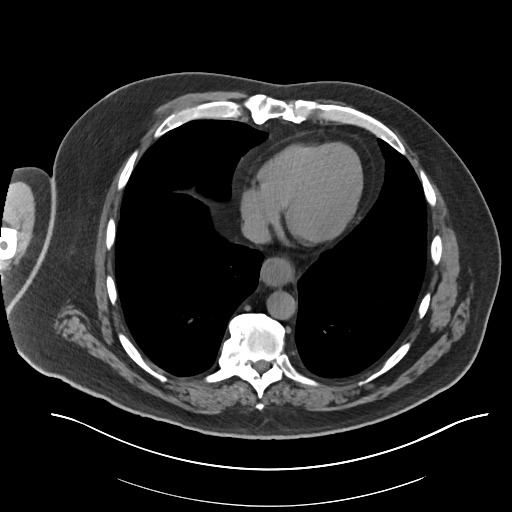

[Series 4: routine abdomen pelvis without 2.00 br40 s3 cor · coronal · non-contrast · 0.93mm/px · 3 of 236 slices shown]
[im 79/236  soft-tissue]
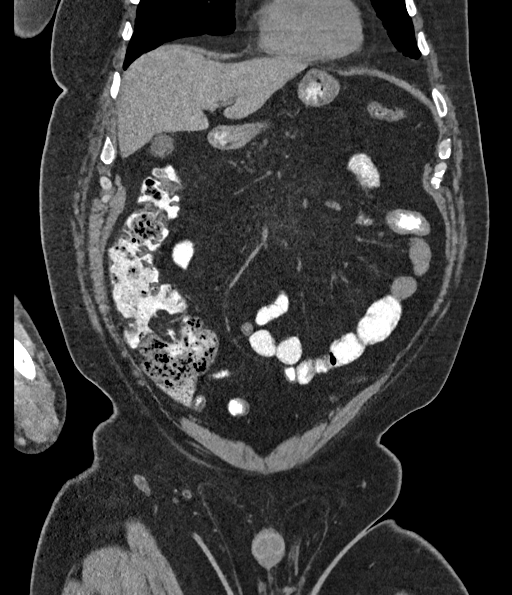
[im 105/236  soft-tissue]
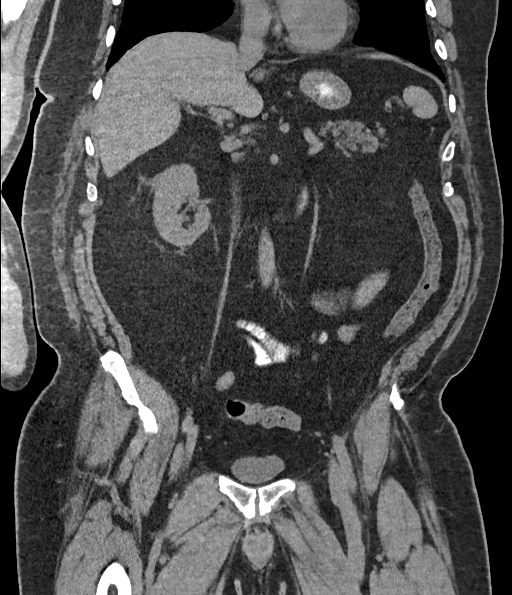
[im 131/236  soft-tissue]
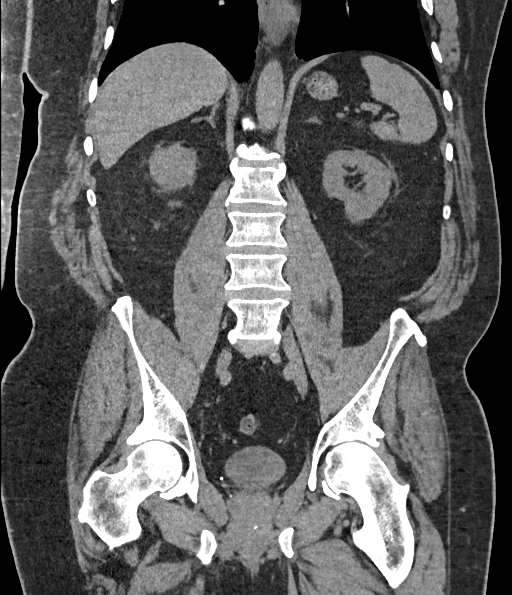

[16 of 46 positions shown; findings below may reference images not displayed]

FINDINGS: Lower chest: Lower esophageal circumferential wall thickening with
mild surrounding fat stranding. The visualized lung bases are clear.

Hepatobiliary: 1.0 cm cyst in the left hepatic lobe. No other focal
liver abnormality. The gallbladder is unremarkable. No biliary
dilatation.

Pancreas: Mild fatty atrophy. No ductal dilatation or surrounding
inflammatory changes.

Spleen: Normal in size without focal abnormality.

Adrenals/Urinary Tract: Adrenal glands are unremarkable. Kidneys are
normal, without renal calculi, focal lesion, or hydronephrosis.
Bladder is unremarkable for the degree of distention.

Stomach/Bowel: Stomach is within normal limits. Appendix appears
normal. No evidence of bowel wall thickening, distention, or
inflammatory changes.

Vascular/Lymphatic: No significant vascular findings are present.
Prominent lymph nodes near the gastroesophageal junction measuring
up to 10 mm in short axis (series 2, images 12 and 20). No
additional enlarged abdominal or pelvic lymph nodes.

Reproductive: Prostate is unremarkable.

Other: Small fat containing bilateral inguinal hernias. No free
fluid or pneumoperitoneum.

Musculoskeletal: 3.9 x 2.4 cm lytic lesion in the left iliac bone
with cortical destruction (series 2, image 74). Tiny lucent lesion
in the L2 superior endplate (series 4, image 132).
IMPRESSION: 1. 3.9 x 2.4 cm lytic lesion in the left iliac bone. Given right
scapular lesion, findings are most concerning for metastatic
disease. Tiny lucent lesion in the L2 superior endplate is
indeterminate but may be a small metastasis as well.
2. Lower esophageal circumferential wall thickening with mild
surrounding fat stranding. Two prominent lymph nodes near the
gastroesophageal junction. In light of the osseous findings,
correlation with endoscopy is recommended to exclude malignancy.

## 2019-10-21 NOTE — Telephone Encounter (Signed)
Pt would like to know if we know when [facility] will reach out due to them having discussed someone calling him and no one has yet  [phone]

## 2019-10-21 NOTE — Telephone Encounter (Signed)
Spoke with patient someone from office did try to call them yesterday. Patient stated he has started staying with someone as to not be alone due to his condition and must have missed the call.  Provider # [phone] for patient to call and schedule.

## 2019-10-26 ENCOUNTER — Other Ambulatory Visit: Payer: 59

## 2019-10-27 ENCOUNTER — Other Ambulatory Visit: Payer: Self-pay

## 2019-10-27 ENCOUNTER — Encounter: Payer: Self-pay | Admitting: Family Medicine

## 2019-10-27 ENCOUNTER — Ambulatory Visit: Payer: 59 | Admitting: Family Medicine

## 2019-10-27 VITALS — BP 110/72 | HR 101 | Temp 97.1°F | Ht 76.0 in | Wt 290.0 lb

## 2019-10-27 DIAGNOSIS — R634 Abnormal weight loss: Secondary | ICD-10-CM

## 2019-10-27 DIAGNOSIS — R944 Abnormal results of kidney function studies: Secondary | ICD-10-CM | POA: Diagnosis not present

## 2019-10-27 DIAGNOSIS — M25811 Other specified joint disorders, right shoulder: Secondary | ICD-10-CM

## 2019-10-27 NOTE — Patient Instructions (Signed)
Good to see you today  I will get records from [place] protein in you meals, snacks, protein shakes ok

## 2019-10-27 NOTE — Progress Notes (Signed)
   Subjective:    Patient ID: Jacob Henson, male    DOB: 1960/06/23, 59 y.o.   MRN: [id number]  HPI Chief Complaint  Patient presents with  . Follow-up    lab work/kidney function   This is a 59 yo male who presents today for follow up.  He has recently been found to have a mass in his right shoulder.  He has had CT of abdomen and pelvis and may have some additional lesions.  He has been seen at [facility] and is scheduled for upcoming biopsy, his gastroenterologist, Dr. Watt Climes will be doing an EGD at the end of this week.  Patient is understandably shaken by recent diagnoses.  According to the patient, who presents today for follow-up of abnormal kidney function tests that were done at gastroenterology.  Abdominal pain- work up by Dr. Watt Climes. Had labs done. Has been having severe gas and abdominal pain with food. Has EGD scheduled for 2 days. Has been on pantoprazole with some relief. Gas x with some improvement. Some constipation intermittently.   Reduced GFR- stopped NSAIDs. Some decreased water intake due to stomach hurting. Last values in Epic from 12/20- GFR 51.     Review of Systems Per HPI    Objective:   Physical Exam Physical Exam  Constitutional: Oriented to person, place, and time. Appears well-developed and well-nourished.  HENT:  Head: Normocephalic and atraumatic.  Eyes: Conjunctivae are normal.  Neck: Normal range of motion. Neck supple.  Cardiovascular: Normal rate, regular rhythm and normal heart sounds.   Pulmonary/Chest: Effort normal and breath sounds normal.  Musculoskeletal: No lower extremity edema.   Neurological: Alert and oriented to person, place, and time.  Skin: Skin is warm and dry.  Psychiatric: Normal mood and affect. Behavior is normal. Judgment and thought content normal.  Vitals reviewed.     BP 110/72   Pulse (!) 101   Temp (!) 97.1 F (36.2 C) (Temporal)   Ht 6\' 4"  (1.93 m)   Wt 290 lb (131.5 kg)   SpO2 96%   BMI 35.30 kg/m  Wt  Readings from Last 3 Encounters:  10/27/19 290 lb (131.5 kg)  09/03/19 (!) 323 lb 8 oz (146.7 kg)  07/14/19 (!) 332 lb 1.9 oz (150.6 kg)       Assessment & Plan:  1. Decreased GFR - received records from Dr. Perley Jain office, labs from 10/20/19- Bun/ Cr 29/2.41, GFR 28 - awaiting results from today, encouraged patient to increase fluids as tolerated, he is not taking any NSAIDs - Comprehensive metabolic panel - TSH  2. Unintentional weight loss - likely multifactorial- he has had decreased appetite, early satiety with abdominal pain, has upcoming EGD, could also be related to malignancy - TSH - discussed prioritizing protein at meals/ snacks  3. Mass of joint of right shoulder - being followed by [facility] orthopedic oncology, upcoming CT guided biopsy  This visit occurred during the SARS-CoV-2 public health emergency.  Safety protocols were in place, including screening questions prior to the visit, additional usage of staff PPE, and extensive cleaning of exam room while observing appropriate contact time as indicated for disinfecting solutions.      Clarene Reamer, FNP-BC  [facility] at [facility], [facility]  10/27/2019 4:54 PM

## 2019-10-28 ENCOUNTER — Other Ambulatory Visit: Payer: Self-pay | Admitting: Orthopaedic Surgery

## 2019-10-28 ENCOUNTER — Encounter: Payer: Self-pay | Admitting: Family Medicine

## 2019-10-28 DIAGNOSIS — I10 Essential (primary) hypertension: Secondary | ICD-10-CM | POA: Insufficient documentation

## 2019-10-28 DIAGNOSIS — K219 Gastro-esophageal reflux disease without esophagitis: Secondary | ICD-10-CM | POA: Insufficient documentation

## 2019-10-28 DIAGNOSIS — M25811 Other specified joint disorders, right shoulder: Secondary | ICD-10-CM | POA: Insufficient documentation

## 2019-10-28 LAB — COMPREHENSIVE METABOLIC PANEL
ALT: 28 U/L (ref 0–53)
AST: 19 U/L (ref 0–37)
Albumin: 3.9 g/dL (ref 3.5–5.2)
Alkaline Phosphatase: 92 U/L (ref 39–117)
BUN: 22 mg/dL (ref 6–23)
CO2: 30 mEq/L (ref 19–32)
Calcium: 9.5 mg/dL (ref 8.4–10.5)
Chloride: 96 mEq/L (ref 96–112)
Creatinine, Ser: 1.71 mg/dL — ABNORMAL HIGH (ref 0.40–1.50)
GFR: 41.1 mL/min — ABNORMAL LOW (ref >60.00)
Glucose, Bld: 130 mg/dL — ABNORMAL HIGH (ref 70–99)
Potassium: 4.3 mEq/L (ref 3.5–5.1)
Sodium: 133 mEq/L — ABNORMAL LOW (ref 135–145)
Total Bilirubin: 0.5 mg/dL (ref 0.2–1.2)
Total Protein: 7.1 g/dL (ref 6.0–8.3)

## 2019-10-28 LAB — TSH: TSH: 2.85 u[IU]/mL (ref 0.35–4.50)

## 2019-10-29 MED ORDER — HYDROCODONE-ACETAMINOPHEN 5-325 MG PO TABS: 1.0000 | ORAL_TABLET | Freq: Four times a day (QID) | ORAL | 0 refills | Status: DC | PRN

## 2019-10-31 ENCOUNTER — Other Ambulatory Visit: Payer: 59

## 2019-10-31 ENCOUNTER — Encounter: Payer: Self-pay | Admitting: Orthopaedic Surgery

## 2019-11-01 ENCOUNTER — Encounter (HOSPITAL_COMMUNITY): Payer: 59

## 2019-11-01 ENCOUNTER — Encounter (HOSPITAL_COMMUNITY): Payer: Self-pay

## 2019-11-01 ENCOUNTER — Encounter: Payer: Self-pay | Admitting: Family Medicine

## 2019-11-02 ENCOUNTER — Encounter: Payer: Self-pay | Admitting: Family Medicine

## 2019-11-02 ENCOUNTER — Telehealth: Payer: Self-pay

## 2019-11-02 NOTE — Telephone Encounter (Signed)
Patient called in wanting to ask questions about bloodwork

## 2019-11-03 NOTE — Telephone Encounter (Signed)
Patient states he called by mistake, he meant to call his PCP

## 2019-11-05 ENCOUNTER — Encounter: Payer: Self-pay | Admitting: Orthopaedic Surgery

## 2019-11-06 ENCOUNTER — Other Ambulatory Visit: Payer: Self-pay | Admitting: Orthopaedic Surgery

## 2019-11-06 MED ORDER — HYDROCODONE-ACETAMINOPHEN 5-325 MG PO TABS
1.0000 | ORAL_TABLET | Freq: Four times a day (QID) | ORAL | 0 refills | Status: DC | PRN
Start: 2019-11-06 — End: 2019-11-15

## 2019-11-12 DIAGNOSIS — Z510 Encounter for antineoplastic radiation therapy: Secondary | ICD-10-CM | POA: Insufficient documentation

## 2019-11-12 DIAGNOSIS — T451X5A Adverse effect of antineoplastic and immunosuppressive drugs, initial encounter: Secondary | ICD-10-CM | POA: Insufficient documentation

## 2019-11-12 DIAGNOSIS — G893 Neoplasm related pain (acute) (chronic): Secondary | ICD-10-CM | POA: Insufficient documentation

## 2019-11-12 DIAGNOSIS — Z5189 Encounter for other specified aftercare: Secondary | ICD-10-CM | POA: Insufficient documentation

## 2019-11-12 DIAGNOSIS — Z1152 Encounter for screening for COVID-19: Secondary | ICD-10-CM | POA: Insufficient documentation

## 2019-11-12 DIAGNOSIS — C7951 Secondary malignant neoplasm of bone: Secondary | ICD-10-CM | POA: Insufficient documentation

## 2019-11-12 DIAGNOSIS — D701 Agranulocytosis secondary to cancer chemotherapy: Secondary | ICD-10-CM | POA: Insufficient documentation

## 2019-11-12 DIAGNOSIS — Z87891 Personal history of nicotine dependence: Secondary | ICD-10-CM | POA: Insufficient documentation

## 2019-11-12 DIAGNOSIS — E871 Hypo-osmolality and hyponatremia: Secondary | ICD-10-CM | POA: Insufficient documentation

## 2019-11-12 DIAGNOSIS — Z5111 Encounter for antineoplastic chemotherapy: Secondary | ICD-10-CM | POA: Insufficient documentation

## 2019-11-12 DIAGNOSIS — C159 Malignant neoplasm of esophagus, unspecified: Secondary | ICD-10-CM | POA: Insufficient documentation

## 2019-11-12 DIAGNOSIS — Z5112 Encounter for antineoplastic immunotherapy: Secondary | ICD-10-CM | POA: Insufficient documentation

## 2019-11-15 ENCOUNTER — Encounter: Payer: Self-pay | Admitting: Oncology

## 2019-11-15 ENCOUNTER — Other Ambulatory Visit: Payer: Self-pay

## 2019-11-15 ENCOUNTER — Inpatient Hospital Stay (HOSPITAL_BASED_OUTPATIENT_CLINIC_OR_DEPARTMENT_OTHER): Payer: 59 | Admitting: Oncology

## 2019-11-15 ENCOUNTER — Other Ambulatory Visit: Payer: Self-pay | Admitting: *Deleted

## 2019-11-15 ENCOUNTER — Other Ambulatory Visit: Payer: 59

## 2019-11-15 ENCOUNTER — Ambulatory Visit: Payer: 59 | Admitting: Oncology

## 2019-11-15 ENCOUNTER — Inpatient Hospital Stay: Payer: 59

## 2019-11-15 VITALS — BP 127/78 | HR 76 | Temp 97.7°F | Resp 18 | Ht 77.0 in | Wt 276.5 lb

## 2019-11-15 DIAGNOSIS — K219 Gastro-esophageal reflux disease without esophagitis: Secondary | ICD-10-CM | POA: Insufficient documentation

## 2019-11-15 DIAGNOSIS — Z20822 Contact with and (suspected) exposure to covid-19: Secondary | ICD-10-CM | POA: Insufficient documentation

## 2019-11-15 DIAGNOSIS — Z79899 Other long term (current) drug therapy: Secondary | ICD-10-CM | POA: Insufficient documentation

## 2019-11-15 DIAGNOSIS — Z833 Family history of diabetes mellitus: Secondary | ICD-10-CM | POA: Insufficient documentation

## 2019-11-15 DIAGNOSIS — M25511 Pain in right shoulder: Secondary | ICD-10-CM | POA: Insufficient documentation

## 2019-11-15 DIAGNOSIS — G893 Neoplasm related pain (acute) (chronic): Secondary | ICD-10-CM

## 2019-11-15 DIAGNOSIS — R110 Nausea: Secondary | ICD-10-CM

## 2019-11-15 DIAGNOSIS — E871 Hypo-osmolality and hyponatremia: Secondary | ICD-10-CM | POA: Insufficient documentation

## 2019-11-15 DIAGNOSIS — R911 Solitary pulmonary nodule: Secondary | ICD-10-CM | POA: Insufficient documentation

## 2019-11-15 DIAGNOSIS — N189 Chronic kidney disease, unspecified: Secondary | ICD-10-CM | POA: Insufficient documentation

## 2019-11-15 DIAGNOSIS — Z8249 Family history of ischemic heart disease and other diseases of the circulatory system: Secondary | ICD-10-CM

## 2019-11-15 DIAGNOSIS — C7800 Secondary malignant neoplasm of unspecified lung: Secondary | ICD-10-CM | POA: Insufficient documentation

## 2019-11-15 DIAGNOSIS — C159 Malignant neoplasm of esophagus, unspecified: Secondary | ICD-10-CM | POA: Diagnosis present

## 2019-11-15 DIAGNOSIS — Z7189 Other specified counseling: Secondary | ICD-10-CM

## 2019-11-15 DIAGNOSIS — T451X5A Adverse effect of antineoplastic and immunosuppressive drugs, initial encounter: Secondary | ICD-10-CM | POA: Diagnosis not present

## 2019-11-15 DIAGNOSIS — I959 Hypotension, unspecified: Secondary | ICD-10-CM | POA: Insufficient documentation

## 2019-11-15 DIAGNOSIS — I10 Essential (primary) hypertension: Secondary | ICD-10-CM

## 2019-11-15 DIAGNOSIS — Z87891 Personal history of nicotine dependence: Secondary | ICD-10-CM

## 2019-11-15 DIAGNOSIS — Z01812 Encounter for preprocedural laboratory examination: Secondary | ICD-10-CM | POA: Insufficient documentation

## 2019-11-15 DIAGNOSIS — Z1152 Encounter for screening for COVID-19: Secondary | ICD-10-CM | POA: Diagnosis not present

## 2019-11-15 DIAGNOSIS — C155 Malignant neoplasm of lower third of esophagus: Secondary | ICD-10-CM | POA: Insufficient documentation

## 2019-11-15 DIAGNOSIS — R970 Elevated carcinoembryonic antigen [CEA]: Secondary | ICD-10-CM

## 2019-11-15 DIAGNOSIS — C7951 Secondary malignant neoplasm of bone: Secondary | ICD-10-CM

## 2019-11-15 DIAGNOSIS — D701 Agranulocytosis secondary to cancer chemotherapy: Secondary | ICD-10-CM | POA: Diagnosis not present

## 2019-11-15 DIAGNOSIS — R000 Tachycardia, unspecified: Secondary | ICD-10-CM | POA: Insufficient documentation

## 2019-11-15 DIAGNOSIS — Z5111 Encounter for antineoplastic chemotherapy: Secondary | ICD-10-CM | POA: Insufficient documentation

## 2019-11-15 DIAGNOSIS — Z5189 Encounter for other specified aftercare: Secondary | ICD-10-CM | POA: Insufficient documentation

## 2019-11-15 DIAGNOSIS — Z5112 Encounter for antineoplastic immunotherapy: Secondary | ICD-10-CM | POA: Insufficient documentation

## 2019-11-15 DIAGNOSIS — Z510 Encounter for antineoplastic radiation therapy: Secondary | ICD-10-CM | POA: Diagnosis present

## 2019-11-15 DIAGNOSIS — R1314 Dysphagia, pharyngoesophageal phase: Secondary | ICD-10-CM | POA: Insufficient documentation

## 2019-11-15 DIAGNOSIS — E860 Dehydration: Secondary | ICD-10-CM | POA: Insufficient documentation

## 2019-11-15 MED ORDER — OXYCODONE HCL 5 MG PO TABS: 5.0000 mg | ORAL_TABLET | ORAL | 0 refills | Status: DC | PRN

## 2019-11-15 MED ORDER — OLANZAPINE 10 MG PO TABS: 10.0000 mg | ORAL_TABLET | Freq: Every day | ORAL | 3 refills | Status: AC

## 2019-11-15 NOTE — Progress Notes (Signed)
Hematology/Oncology Consult note [facility] Telephone:([phone] [phone]  Patient Care Team: Elby Beck, FNP as PCP - General (Nurse Practitioner)   Name of the patient: Jacob Henson  [id number]  06/18/1960    Reason for referral-new diagnosis of metastatic esophageal cancer   Referring physician-Dr. Lynnea Maizes  Date of visit: 11/15/19   History of presenting illness- Patient is a 59 year old male who was admitted to [facility] with symptoms of nausea vomiting poor appetite as well as worsening right scapular pain he underwent CT chest abdomen pelvis with contrast on 11/09/2019 which showed mural thickening of the lower one third of the esophagus consistent with esophageal malignancy.  Soft tissue lesions in the right scapula and left iliac crest consistent with osseous metastatic disease.  Soft tissue nodules at the celiac trunk and the GE junction concerning for metastatic lymphadenopathy.  Nonspecific left upper lobe pulmonary nodule and right paracolic peritoneal nodule.  He underwent FNA of the right scapular lytic lesion which was consistent with adenocarcinoma.  Malignant cells positive for CK7 and CK20 and negative for TTF-1 and PAX 3 and CDX2 was negative.  Differential diagnosis includes pancreaticobiliary and GI origin.CEA elevated at 135.  CBC was unremarkable.  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  He also reports some difficulty and pain during swallowing.  States that he feels nauseous when he eats anything  and Zofran and Compazine have not been helping him much.  ECOG PS- 1  Pain scale- 5   Review of systems- Review of Systems  Constitutional: Positive for malaise/fatigue and weight loss. Negative for chills and fever.  HENT: Negative for congestion, ear discharge and nosebleeds.   Eyes: Negative for blurred vision.  Respiratory: Negative for cough, hemoptysis, sputum production, shortness of breath and wheezing.   Cardiovascular: Negative for chest pain, palpitations, orthopnea and claudication.  Gastrointestinal: Positive for nausea. Negative for abdominal pain, blood in stool, constipation, diarrhea, heartburn, melena and vomiting.  Genitourinary: Negative for dysuria, flank pain, frequency, hematuria and urgency.  Musculoskeletal: Negative for back pain, joint pain and myalgias.       Right shoulder pain  Skin: Negative for rash.  Neurological: Negative for dizziness, tingling, focal weakness, seizures, weakness and headaches.  Endo/Heme/Allergies: Does not bruise/bleed easily.  Psychiatric/Behavioral: Negative for depression and suicidal ideas. The patient does not have insomnia.     No Known Allergies  Patient Active Problem List   Diagnosis Date Noted  . Goals of care, counseling/discussion 11/15/2019  . Mass of joint of right shoulder 10/28/2019  . Essential hypertension 10/28/2019  . GERD (gastroesophageal reflux disease) 10/28/2019     Past Medical History:  Diagnosis Date  . Allergy   . GERD (gastroesophageal reflux disease)    Has required esophageal dilation in past  . Hypertension      Past Surgical History:  Procedure Laterality Date  . ESOPHAGEAL DILATION      Social History   Socioeconomic History  . Marital status: Married    Spouse name: Not on file  . Number of children: Not on file  . Years of education: Not on file  .  Highest education level: Not on file  Occupational History  . Occupation: full time  Tobacco Use  . Smoking status: Former  Smoker    Packs/day: 1.00    Years: 4.00    Pack years: 4.00    Types: Cigarettes    Quit date: 10/14/1978    Years since quitting: 41.1  . Smokeless tobacco: Never Used  Vaping Use  . Vaping Use: Never used  Substance and Sexual Activity  . Alcohol use: No  . Drug use: Never  . Sexual activity: Not on file  Other Topics Concern  . Not on file  Social History Narrative  . Not on file   Social Determinants of Health   Financial Resource Strain:   . Difficulty of Paying Living Expenses: Not on file  Food Insecurity:   . Worried About Charity fundraiser in the Last Year: Not on file  . Ran Out of Food in the Last Year: Not on file  Transportation Needs:   . Lack of Transportation (Medical): Not on file  . Lack of Transportation (Non-Medical): Not on file  Physical Activity:   . Days of Exercise per Week: Not on file  . Minutes of Exercise per Session: Not on file  Stress:   . Feeling of Stress : Not on file  Social Connections:   . Frequency of Communication with Friends and Family: Not on file  . Frequency of Social Gatherings with Friends and Family: Not on file  . Attends Religious Services: Not on file  . Active Member of Clubs or Organizations: Not on file  . Attends Archivist Meetings: Not on file  . Marital Status: Not on file  Intimate Partner Violence:   . Fear of Current or Ex-Partner: Not on file  . Emotionally Abused: Not on file  . Physically Abused: Not on file  . Sexually Abused: Not on file     Family History  Problem Relation Age of Onset  . Heart disease Father   . Hearing loss Father   . Diabetes Father   . Heart disease Paternal Grandmother   . Diabetes Paternal Grandmother   . Heart attack Paternal Grandfather      Current Outpatient Medications:  .  fluticasone (FLONASE) 50 MCG/ACT nasal spray, Place 2 sprays into both nostrils daily., Disp: 16 g, Rfl: 6 .  levocetirizine (XYZAL) 5 MG tablet, Take by mouth., Disp: , Rfl:  .   lisinopril (ZESTRIL) 20 MG tablet, Take 1 tablet (20 mg total) by mouth daily., Disp: 90 tablet, Rfl: 3 .  ondansetron (ZOFRAN-ODT) 8 MG disintegrating tablet, Take by mouth., Disp: , Rfl:  .  pantoprazole (PROTONIX) 40 MG tablet, Take 1 tablet by mouth 2 (two) times daily., Disp: , Rfl:  .  prochlorperazine (COMPAZINE) 10 MG tablet, Take by mouth., Disp: , Rfl:  .  sucralfate (CARAFATE) 1 GM/10ML suspension, TAKE 10 ML ON AN EMPTY STOMACH ORALLY FOUR TIMES A DAY 30 DAY(S), Disp: , Rfl:  .  OLANZapine (ZYPREXA) 10 MG tablet, Take 1 tablet (10 mg total) by mouth at bedtime., Disp: 30 tablet, Rfl: 3 .  oxyCODONE (OXY IR/ROXICODONE) 5 MG immediate release tablet, Take 1 tablet (5 mg total) by mouth every 4 (four) hours as needed for breakthrough pain., Disp: 120 tablet, Rfl: 0   Physical exam:  Vitals:   11/15/19 1109  BP: 127/78  Pulse: 76  Resp: 18  Temp: 97.7 F (36.5 C)  TempSrc: Tympanic  SpO2: 96%  Weight:  276 lb 8 oz (125.4 kg)  Height: 6' 5" (1.956 m)   Physical Exam Constitutional:      Comments: Appears fatigued  HENT:     Head: Normocephalic and atraumatic.  Cardiovascular:     Rate and Rhythm: Normal rate and regular rhythm.     Heart sounds: Normal heart sounds.  Pulmonary:     Effort: Pulmonary effort is normal.     Breath sounds: Normal breath sounds.  Abdominal:     General: Bowel sounds are normal.     Palpations: Abdomen is soft.  Musculoskeletal:     Comments: Right arm movement restricted to initially 40 degrees  Skin:    General: Skin is warm and dry.  Neurological:     Mental Status: He is alert and oriented to person, place, and time.        CMP Latest Ref Rng & Units 10/27/2019  Glucose 70 - 99 mg/dL 130(H)  BUN 6 - 23 mg/dL 22  Creatinine 0.40 - 1.50 mg/dL 1.71(H)  Sodium 135 - 145 mEq/L 133(L)  Potassium 3.5 - 5.1 mEq/L 4.3  Chloride 96 - 112 mEq/L 96  CO2 19 - 32 mEq/L 30  Calcium 8.4 - 10.5 mg/dL 9.5  Total Protein 6.0 - 8.3 g/dL 7.1    Total Bilirubin 0.2 - 1.2 mg/dL 0.5  Alkaline Phos 39 - 117 U/L 92  AST 0 - 37 U/L 19  ALT 0 - 53 U/L 28   CBC Latest Ref Rng & Units 10/13/2019  WBC - 9.0  Hemoglobin 13.5 - 17.5 15.8  Hematocrit 41 - 53 46  Platelets 150 - 400 K/uL -    No images are attached to the encounter.  CT ABDOMEN PELVIS WO CONTRAST  Result Date: 10/21/2019 CLINICAL DATA:  Abdominal pain and 60 pound weight loss for the past 3 months. Right scapular mass recently found on MRI this week. EXAM: CT ABDOMEN AND PELVIS WITHOUT CONTRAST TECHNIQUE: Multidetector CT imaging of the abdomen and pelvis was performed following the standard protocol without IV contrast. COMPARISON:  Abdominal ultrasound dated September 06, 2019. FINDINGS: Lower chest: Lower esophageal circumferential wall thickening with mild surrounding fat stranding. The visualized lung bases are clear. Hepatobiliary: 1.0 cm cyst in the left hepatic lobe. No other focal liver abnormality. The gallbladder is unremarkable. No biliary dilatation. Pancreas: Mild fatty atrophy. No ductal dilatation or surrounding inflammatory changes. Spleen: Normal in size without focal abnormality. Adrenals/Urinary Tract: Adrenal glands are unremarkable. Kidneys are normal, without renal calculi, focal lesion, or hydronephrosis. Bladder is unremarkable for the degree of distention. Stomach/Bowel: Stomach is within normal limits. Appendix appears normal. No evidence of bowel wall thickening, distention, or inflammatory changes. Vascular/Lymphatic: No significant vascular findings are present. Prominent lymph nodes near the gastroesophageal junction measuring up to 10 mm in short axis (series 2, images 12 and 20). No additional enlarged abdominal or pelvic lymph nodes. Reproductive: Prostate is unremarkable. Other: Small fat containing bilateral inguinal hernias. No free fluid or pneumoperitoneum. Musculoskeletal: 3.9 x 2.4 cm lytic lesion in the left iliac bone with cortical destruction (series  2, image 74). Tiny lucent lesion in the L2 superior endplate (series 4, image 132). IMPRESSION: 1. 3.9 x 2.4 cm lytic lesion in the left iliac bone. Given right scapular lesion, findings are most concerning for metastatic disease. Tiny lucent lesion in the L2 superior endplate is indeterminate but may be a small metastasis as well. 2. Lower esophageal circumferential wall thickening with mild surrounding fat stranding. Two prominent  lymph nodes near the gastroesophageal junction. In light of the osseous findings, correlation with endoscopy is recommended to exclude malignancy. Electronically Signed   By: Titus Dubin M.D.   On: 10/21/2019 16:06    Assessment and plan- Patient is a 59 y.o. 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  There is thickening involving the lower one third of the esophagus and soft tissue nodules in the celiac trunk all of which are concerning for metastatic disease.  Patient has had 2 separate biopsies one from the right scapular area and the second 1 from the lower one third of the esophagus and both were consistent with adenocarcinoma.  Scapular biopsy was positive for CK7 and CK20 and negative for CDX2.  Immunohistochemical profile was consistent with pancreaticobiliary versus GI origin and given the appearance of the CT scan as well as endoscopy findings this is all consistent with metastatic esophageal adenocarcinoma.  We will obtain HER-2 testing as well as PD-L1/CPS score on his biopsy specimen.  If he is HER-2 positive he would qualify for upfront FOLFOX plus immunotherapy with Keytruda.As per the phase 3 keynote 811 trial which showed improvement in the objective response rate as well as complete response rate in patients with metastatic HER-2  positive GE junction adenocarcinoma.  However if he is HER-2 negative I will await his CPS score.  If CPS is greater than or equal to 5 there would be role of combining nivolumab with FOLFOX chemotherapy until progression or toxicity.  Discussed risks and benefits of FOLFOX chemotherapy including all but not limited to nausea, vomiting, low blood counts, risk of infections and hospitalizations.  Risk of peripheral neuropathy associated with oxaliplatin.  Treatment will be given IV every 2 weeks until progression or toxicity.  Patient will need chemo teach as well as port placement for starting treatment should hopefully begin next week.  I will also obtain PET scanBaseline CEA was elevated at 135.  Discussed that treatment will be palliative and not curative to improve his quality of life and overall survival.  Overall survival in stage IV esophageal cancer approximately 1-1/2 to 2 years with treatment and without treatment would be less than 6 months.  Patient and his wife understand and are agreeable to proceed as planned.  We will also obtain outside pathology slides for comparison here and for future NGS testing.  Discussed the role of bisphosphonates given bone metastases.  We will need to obtain dental clearance prior to starting Xgeva or Zometa.  Discussed risks and benefits of bisphosphonates including all but not limited to fatigue, hypocalcemia and possible risk of osteonecrosis of the jaw.  Patient understands and agrees to proceed as planned  Nausea: Likely secondary to underlying malignancy.  Zofran Compazine has not helped.  I will add Zyprexa 10 mg at bedtime.  We will also refer patient to nutrition counseling given his ongoing weight loss  Neoplasm related pain: Mainly in the right scapular area for which she is taking as needed hydrocodone.  I will discontinue the hydrocodone and switch him to oxycodone 5 mg every 4 hours as needed and gradually titrate dose based on tolerance and  efficacy.  I will refer the patient to radiation oncology for consideration for palliative radiation to his right shoulder.  EGD did not  show any significant obstruction in the lower one third of the esophagus although patient reports some pain.  It may be okay to hold off on giving him palliative radiation to his esophagus at this time to allow continuation of FOLFOX immunotherapy.  But I will defer this decision to radiation oncology as well   Total face to face encounter time for this patient visit was 45 min.  Time spent in reviewing outside records as well as discussing patients case with 15 min  Thank you for this kind referral and the opportunity to participate in the care of this patient   Visit Diagnosis 1. Esophageal adenocarcinoma ([place])   2. Goals of care, counseling/discussion   3. Bone metastases ([place])   4. Neoplasm related pain   5. Nausea without vomiting     Dr. Randa Evens, MD, MPH [facility] at [facility] [phone] 11/15/2019

## 2019-11-15 NOTE — Progress Notes (Signed)
START ON PATHWAY REGIMEN - Gastroesophageal     A cycle is every 14 days:     Oxaliplatin      Leucovorin      Fluorouracil      Fluorouracil   **Always confirm dose/schedule in your pharmacy ordering system**  Patient Characteristics: Distant Metastases (cM1/pM1) / Locally Recurrent Disease, Adenocarcinoma - Esophageal, GE Junction, and Gastric, First Line, HER2 Negative/Unknown, PD?L1 Expression CPS < 5/Negative/Unknown, MSS/pMMR or MSI Unknown Histology: Adenocarcinoma Disease Classification: GE Junction Therapeutic Status: Distant Metastases (No Additional Staging) Line of Therapy: First Line HER2 Status: Awaiting Test Results PD-L1 Expression Status: Awaiting Test Results Microsatellite/Mismatch Repair Status: Unknown Intent of Therapy: Non-Curative / Palliative Intent, Discussed with Patient

## 2019-11-15 NOTE — Progress Notes (Signed)
Met with Jacob Henson and his spouse, Jacob Henson, during consult with Dr. Janese Banks. Introduced Therapist, nutritional and provided contact information for future needs. Referral sent to dietician for newly diagnosed esophageal cancer, weight loss, nausea, early satiety.   I have called and left a voicemail with [facility] to obtain pathology report for biopsy taken during his 10/29/19 endoscopy so that we may order ancillary studies. Dr. Janese Banks would like HER-2 and PD-L1 on specimen. I have obtained a copy of the endoscopy report.   Slides requested from [facility] on the right shoulder biopsy taken 11/08/19. [id number]

## 2019-11-15 NOTE — H&P (View-Only) (Signed)
 Hematology/Oncology Consult note [facility] [phone] [phone]  Patient Care Team: Gessner, Deborah B, FNP as PCP - General (Nurse Practitioner)   Name of the patient: Jacob Henson  [id number]  09/08/1960    Reason for referral-new diagnosis of metastatic esophageal cancer   Referring physician-Dr. Ravi Paluri  Date of visit: 11/15/19   History of presenting illness- Patient is a 59-year-old male who was admitted to [facility] with symptoms of nausea vomiting poor appetite as well as worsening right scapular pain he underwent CT chest abdomen pelvis with contrast on 11/09/2019 which showed mural thickening of the lower one third of the esophagus consistent with esophageal malignancy.  Soft tissue lesions in the right scapula and left iliac crest consistent with osseous metastatic disease.  Soft tissue nodules at the celiac trunk and the GE junction concerning for metastatic lymphadenopathy.  Nonspecific left upper lobe pulmonary nodule and right paracolic peritoneal nodule.  He underwent FNA of the right scapular lytic lesion which was consistent with adenocarcinoma.  Malignant cells positive for CK7 and CK20 and negative for TTF-1 and PAX 3 and CDX2 was negative.  Differential diagnosis includes pancreaticobiliary and GI origin.CEA elevated at 135.  CBC was unremarkable.  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  He also reports some difficulty and pain during swallowing.  States that he feels nauseous when he eats anything  and Zofran and Compazine have not been helping him much.  ECOG PS- 1  Pain scale- 5   Review of systems- Review of Systems  Constitutional: Positive for malaise/fatigue and weight loss. Negative for chills and fever.  HENT: Negative for congestion, ear discharge and nosebleeds.   Eyes: Negative for blurred vision.  Respiratory: Negative for cough, hemoptysis, sputum production, shortness of breath and wheezing.   Cardiovascular: Negative for chest pain, palpitations, orthopnea and claudication.  Gastrointestinal: Positive for nausea. Negative for abdominal pain, blood in stool, constipation, diarrhea, heartburn, melena and vomiting.  Genitourinary: Negative for dysuria, flank pain, frequency, hematuria and urgency.  Musculoskeletal: Negative for back pain, joint pain and myalgias.       Right shoulder pain  Skin: Negative for rash.  Neurological: Negative for dizziness, tingling, focal weakness, seizures, weakness and headaches.  Endo/Heme/Allergies: Does not bruise/bleed easily.  Psychiatric/Behavioral: Negative for depression and suicidal ideas. The patient does not have insomnia.     No Known Allergies  Patient Active Problem List   Diagnosis Date Noted  . Goals of care, counseling/discussion 11/15/2019  . Mass of joint of right shoulder 10/28/2019  . Essential hypertension 10/28/2019  . GERD (gastroesophageal reflux disease) 10/28/2019     Past Medical History:  Diagnosis Date  . Allergy   . GERD (gastroesophageal reflux disease)    Has required esophageal dilation in past  . Hypertension      Past Surgical History:  Procedure Laterality Date  . ESOPHAGEAL DILATION      Social History   Socioeconomic History  . Marital status: Married    Spouse name: Not on file  . Number of children: Not on file  . Years of education: Not on file  .   Highest education level: Not on file  Occupational History  . Occupation: full time  Tobacco Use  . Smoking status: Former  Smoker    Packs/day: 1.00    Years: 4.00    Pack years: 4.00    Types: Cigarettes    Quit date: 10/14/1978    Years since quitting: 41.1  . Smokeless tobacco: Never Used  Vaping Use  . Vaping Use: Never used  Substance and Sexual Activity  . Alcohol use: No  . Drug use: Never  . Sexual activity: Not on file  Other Topics Concern  . Not on file  Social History Narrative  . Not on file   Social Determinants of Health   Financial Resource Strain:   . Difficulty of Paying Living Expenses: Not on file  Food Insecurity:   . Worried About Running Out of Food in the Last Year: Not on file  . Ran Out of Food in the Last Year: Not on file  Transportation Needs:   . Lack of Transportation (Medical): Not on file  . Lack of Transportation (Non-Medical): Not on file  Physical Activity:   . Days of Exercise per Week: Not on file  . Minutes of Exercise per Session: Not on file  Stress:   . Feeling of Stress : Not on file  Social Connections:   . Frequency of Communication with Friends and Family: Not on file  . Frequency of Social Gatherings with Friends and Family: Not on file  . Attends Religious Services: Not on file  . Active Member of Clubs or Organizations: Not on file  . Attends Club or Organization Meetings: Not on file  . Marital Status: Not on file  Intimate Partner Violence:   . Fear of Current or Ex-Partner: Not on file  . Emotionally Abused: Not on file  . Physically Abused: Not on file  . Sexually Abused: Not on file     Family History  Problem Relation Age of Onset  . Heart disease Father   . Hearing loss Father   . Diabetes Father   . Heart disease Paternal Grandmother   . Diabetes Paternal Grandmother   . Heart attack Paternal Grandfather      Current Outpatient Medications:  .  fluticasone (FLONASE) 50 MCG/ACT nasal spray, Place 2 sprays into both nostrils daily., Disp: 16 g, Rfl: 6 .  levocetirizine (XYZAL) 5 MG tablet, Take by mouth., Disp: , Rfl:  .   lisinopril (ZESTRIL) 20 MG tablet, Take 1 tablet (20 mg total) by mouth daily., Disp: 90 tablet, Rfl: 3 .  ondansetron (ZOFRAN-ODT) 8 MG disintegrating tablet, Take by mouth., Disp: , Rfl:  .  pantoprazole (PROTONIX) 40 MG tablet, Take 1 tablet by mouth 2 (two) times daily., Disp: , Rfl:  .  prochlorperazine (COMPAZINE) 10 MG tablet, Take by mouth., Disp: , Rfl:  .  sucralfate (CARAFATE) 1 GM/10ML suspension, TAKE 10 ML ON AN EMPTY STOMACH ORALLY FOUR TIMES A DAY 30 DAY(S), Disp: , Rfl:  .  OLANZapine (ZYPREXA) 10 MG tablet, Take 1 tablet (10 mg total) by mouth at bedtime., Disp: 30 tablet, Rfl: 3 .  oxyCODONE (OXY IR/ROXICODONE) 5 MG immediate release tablet, Take 1 tablet (5 mg total) by mouth every 4 (four) hours as needed for breakthrough pain., Disp: 120 tablet, Rfl: 0   Physical exam:  Vitals:   11/15/19 1109  BP: 127/78  Pulse: 76  Resp: 18  Temp: 97.7 F (36.5 C)  TempSrc: Tympanic  SpO2: 96%  Weight:   276 lb 8 oz (125.4 kg)  Height: 6' 5" (1.956 m)   Physical Exam Constitutional:      Comments: Appears fatigued  HENT:     Head: Normocephalic and atraumatic.  Cardiovascular:     Rate and Rhythm: Normal rate and regular rhythm.     Heart sounds: Normal heart sounds.  Pulmonary:     Effort: Pulmonary effort is normal.     Breath sounds: Normal breath sounds.  Abdominal:     General: Bowel sounds are normal.     Palpations: Abdomen is soft.  Musculoskeletal:     Comments: Right arm movement restricted to initially 40 degrees  Skin:    General: Skin is warm and dry.  Neurological:     Mental Status: He is alert and oriented to person, place, and time.        CMP Latest Ref Rng & Units 10/27/2019  Glucose 70 - 99 mg/dL 130(H)  BUN 6 - 23 mg/dL 22  Creatinine 0.40 - 1.50 mg/dL 1.71(H)  Sodium 135 - 145 mEq/L 133(L)  Potassium 3.5 - 5.1 mEq/L 4.3  Chloride 96 - 112 mEq/L 96  CO2 19 - 32 mEq/L 30  Calcium 8.4 - 10.5 mg/dL 9.5  Total Protein 6.0 - 8.3 g/dL 7.1    Total Bilirubin 0.2 - 1.2 mg/dL 0.5  Alkaline Phos 39 - 117 U/L 92  AST 0 - 37 U/L 19  ALT 0 - 53 U/L 28   CBC Latest Ref Rng & Units 10/13/2019  WBC - 9.0  Hemoglobin 13.5 - 17.5 15.8  Hematocrit 41 - 53 46  Platelets 150 - 400 K/uL -    No images are attached to the encounter.  CT ABDOMEN PELVIS WO CONTRAST  Result Date: 10/21/2019 CLINICAL DATA:  Abdominal pain and 60 pound weight loss for the past 3 months. Right scapular mass recently found on MRI this week. EXAM: CT ABDOMEN AND PELVIS WITHOUT CONTRAST TECHNIQUE: Multidetector CT imaging of the abdomen and pelvis was performed following the standard protocol without IV contrast. COMPARISON:  Abdominal ultrasound dated September 06, 2019. FINDINGS: Lower chest: Lower esophageal circumferential wall thickening with mild surrounding fat stranding. The visualized lung bases are clear. Hepatobiliary: 1.0 cm cyst in the left hepatic lobe. No other focal liver abnormality. The gallbladder is unremarkable. No biliary dilatation. Pancreas: Mild fatty atrophy. No ductal dilatation or surrounding inflammatory changes. Spleen: Normal in size without focal abnormality. Adrenals/Urinary Tract: Adrenal glands are unremarkable. Kidneys are normal, without renal calculi, focal lesion, or hydronephrosis. Bladder is unremarkable for the degree of distention. Stomach/Bowel: Stomach is within normal limits. Appendix appears normal. No evidence of bowel wall thickening, distention, or inflammatory changes. Vascular/Lymphatic: No significant vascular findings are present. Prominent lymph nodes near the gastroesophageal junction measuring up to 10 mm in short axis (series 2, images 12 and 20). No additional enlarged abdominal or pelvic lymph nodes. Reproductive: Prostate is unremarkable. Other: Small fat containing bilateral inguinal hernias. No free fluid or pneumoperitoneum. Musculoskeletal: 3.9 x 2.4 cm lytic lesion in the left iliac bone with cortical destruction (series  2, image 74). Tiny lucent lesion in the L2 superior endplate (series 4, image 132). IMPRESSION: 1. 3.9 x 2.4 cm lytic lesion in the left iliac bone. Given right scapular lesion, findings are most concerning for metastatic disease. Tiny lucent lesion in the L2 superior endplate is indeterminate but may be a small metastasis as well. 2. Lower esophageal circumferential wall thickening with mild surrounding fat stranding. Two prominent   lymph nodes near the gastroesophageal junction. In light of the osseous findings, correlation with endoscopy is recommended to exclude malignancy. Electronically Signed   By: Titus Dubin M.D.   On: 10/21/2019 16:06    Assessment and plan- Patient is a 59 y.o. 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  There is thickening involving the lower one third of the esophagus and soft tissue nodules in the celiac trunk all of which are concerning for metastatic disease.  Patient has had 2 separate biopsies one from the right scapular area and the second 1 from the lower one third of the esophagus and both were consistent with adenocarcinoma.  Scapular biopsy was positive for CK7 and CK20 and negative for CDX2.  Immunohistochemical profile was consistent with pancreaticobiliary versus GI origin and given the appearance of the CT scan as well as endoscopy findings this is all consistent with metastatic esophageal adenocarcinoma.  We will obtain HER-2 testing as well as PD-L1/CPS score on his biopsy specimen.  If he is HER-2 positive he would qualify for upfront FOLFOX plus immunotherapy with Keytruda.As per the phase 3 keynote 811 trial which showed improvement in the objective response rate as well as complete response rate in patients with metastatic HER-2  positive GE junction adenocarcinoma.  However if he is HER-2 negative I will await his CPS score.  If CPS is greater than or equal to 5 there would be role of combining nivolumab with FOLFOX chemotherapy until progression or toxicity.  Discussed risks and benefits of FOLFOX chemotherapy including all but not limited to nausea, vomiting, low blood counts, risk of infections and hospitalizations.  Risk of peripheral neuropathy associated with oxaliplatin.  Treatment will be given IV every 2 weeks until progression or toxicity.  Patient will need chemo teach as well as port placement for starting treatment should hopefully begin next week.  I will also obtain PET scanBaseline CEA was elevated at 135.  Discussed that treatment will be palliative and not curative to improve his quality of life and overall survival.  Overall survival in stage IV esophageal cancer approximately 1-1/2 to 2 years with treatment and without treatment would be less than 6 months.  Patient and his wife understand and are agreeable to proceed as planned.  We will also obtain outside pathology slides for comparison here and for future NGS testing.  Discussed the role of bisphosphonates given bone metastases.  We will need to obtain dental clearance prior to starting Xgeva or Zometa.  Discussed risks and benefits of bisphosphonates including all but not limited to fatigue, hypocalcemia and possible risk of osteonecrosis of the jaw.  Patient understands and agrees to proceed as planned  Nausea: Likely secondary to underlying malignancy.  Zofran Compazine has not helped.  I will add Zyprexa 10 mg at bedtime.  We will also refer patient to nutrition counseling given his ongoing weight loss  Neoplasm related pain: Mainly in the right scapular area for which she is taking as needed hydrocodone.  I will discontinue the hydrocodone and switch him to oxycodone 5 mg every 4 hours as needed and gradually titrate dose based on tolerance and  efficacy.  I will refer the patient to radiation oncology for consideration for palliative radiation to his right shoulder.  EGD did not  show any significant obstruction in the lower one third of the esophagus although patient reports some pain.  It may be okay to hold off on giving him palliative radiation to his esophagus at this time to allow continuation of FOLFOX immunotherapy.  But I will defer this decision to radiation oncology as well   Total face to face encounter time for this patient visit was 45 min.  Time spent in reviewing outside records as well as discussing patients case with 15 min  Thank you for this kind referral and the opportunity to participate in the care of this patient   Visit Diagnosis 1. Esophageal adenocarcinoma (HCC)   2. Goals of care, counseling/discussion   3. Bone metastases (HCC)   4. Neoplasm related pain   5. Nausea without vomiting     Dr. Shigeo Baugh, MD, MPH [facility] [phone] 11/15/2019                

## 2019-11-16 ENCOUNTER — Telehealth (INDEPENDENT_AMBULATORY_CARE_PROVIDER_SITE_OTHER): Payer: Self-pay

## 2019-11-16 ENCOUNTER — Telehealth: Payer: Self-pay

## 2019-11-16 ENCOUNTER — Telehealth: Payer: Self-pay | Admitting: *Deleted

## 2019-11-16 NOTE — Telephone Encounter (Signed)
At the request of Dr. Janese Banks, HER-2 and PD-L1 testing have been requested on [id number], esophageal biopsy, collected 10/29/2019. Slides are located at [facility]. Slides have also been requested. Notified pathology that testing takes priority over sending slides as to not delay care.

## 2019-11-16 NOTE — Telephone Encounter (Signed)
Patient was called and is now scheduled with Dr. Lucky Cowboy for a port placement on 11/19/19 with a 11:30 am arrival time to the [facility]. Covid testing on 11/17/19 between 8-1 pm at the [place]. Pre-procedure instructions were discussed and will be mailed.

## 2019-11-16 NOTE — Telephone Encounter (Signed)
Call placed x2 to [facility] to inquire about ordering HER-2 and PD-L1. Awaiting callback from voicemail left.

## 2019-11-16 NOTE — Telephone Encounter (Signed)
Called pt to let him know that I got the message that his port scheduled for 10/8. He had an appt to see Dr. Baruch Gouty for radiation consult and because of the anesthesia you are not to make big decisions and we do not know if he will be done for him to come here. We have changed his radiation appt to 10/12 at 10 am. We made this appt because he is coming over to hospital for PET scan and so the appt to come to cancer center after the PET.all of this info was left on his voicemail and if he had question he could call me and left my number

## 2019-11-17 ENCOUNTER — Other Ambulatory Visit: Payer: Self-pay

## 2019-11-17 ENCOUNTER — Other Ambulatory Visit
Admission: RE | Admit: 2019-11-17 | Discharge: 2019-11-17 | Disposition: A | Discharge status: Home / Self Care | Payer: 59 | Source: Ambulatory Visit | Attending: Vascular Surgery | Admitting: Vascular Surgery

## 2019-11-17 DIAGNOSIS — Z01818 Encounter for other preprocedural examination: Secondary | ICD-10-CM | POA: Diagnosis present

## 2019-11-17 DIAGNOSIS — Z20822 Contact with and (suspected) exposure to covid-19: Secondary | ICD-10-CM | POA: Diagnosis not present

## 2019-11-17 LAB — SARS CORONAVIRUS 2 (TAT 6-24 HRS): SARS Coronavirus 2: NEGATIVE

## 2019-11-17 NOTE — Patient Instructions (Signed)
Oxaliplatin Injection What is this medicine? OXALIPLATIN (ox AL i PLA tin) is a chemotherapy drug. It targets fast dividing cells, like cancer cells, and causes these cells to die. This medicine is used to treat cancers of the colon and rectum, and many other cancers. This medicine may be used for other purposes; ask your health care provider or pharmacist if you have questions. COMMON BRAND NAME(S): Eloxatin What should I tell my health care provider before I take this medicine? They need to know if you have any of these conditions:  heart disease  history of irregular heartbeat  liver disease  low blood counts, like white cells, platelets, or red blood cells  lung or breathing disease, like asthma  take medicines that treat or prevent blood clots  tingling of the fingers or toes, or other nerve disorder  an unusual or allergic reaction to oxaliplatin, other chemotherapy, other medicines, foods, dyes, or preservatives  pregnant or trying to get pregnant  breast-feeding How should I use this medicine? This drug is given as an infusion into a vein. It is administered in a hospital or clinic by a specially trained health care professional. Talk to your pediatrician regarding the use of this medicine in children. Special care may be needed. Overdosage: If you think you have taken too much of this medicine contact a poison control center or emergency room at once. NOTE: This medicine is only for you. Do not share this medicine with others. What if I miss a dose? It is important not to miss a dose. Call your doctor or health care professional if you are unable to keep an appointment. What may interact with this medicine? Do not take this medicine with any of the following medications:  cisapride  dronedarone  pimozide  thioridazine This medicine may also interact with the following medications:  aspirin and aspirin-like medicines  certain medicines that treat or prevent  blood clots like warfarin, apixaban, dabigatran, and rivaroxaban  cisplatin  cyclosporine  diuretics  medicines for infection like acyclovir, adefovir, amphotericin B, bacitracin, cidofovir, foscarnet, ganciclovir, gentamicin, pentamidine, vancomycin  NSAIDs, medicines for pain and inflammation, like ibuprofen or naproxen  other medicines that prolong the QT interval (an abnormal heart rhythm)  pamidronate  zoledronic acid This list may not describe all possible interactions. Give your health care provider a list of all the medicines, herbs, non-prescription drugs, or dietary supplements you use. Also tell them if you smoke, drink alcohol, or use illegal drugs. Some items may interact with your medicine. What should I watch for while using this medicine? Your condition will be monitored carefully while you are receiving this medicine. You may need blood work done while you are taking this medicine. This medicine may make you feel generally unwell. This is not uncommon as chemotherapy can affect healthy cells as well as cancer cells. Report any side effects. Continue your course of treatment even though you feel ill unless your healthcare professional tells you to stop. This medicine can make you more sensitive to cold. Do not drink cold drinks or use ice. Cover exposed skin before coming in contact with cold temperatures or cold objects. When out in cold weather wear warm clothing and cover your mouth and nose to warm the air that goes into your lungs. Tell your doctor if you get sensitive to the cold. Do not become pregnant while taking this medicine or for 9 months after stopping it. Women should inform their health care professional if they wish to become   pregnant or think they might be pregnant. Men should not father a child while taking this medicine and for 6 months after stopping it. There is potential for serious side effects to an unborn child. Talk to your health care professional  for more information. Do not breast-feed a child while taking this medicine or for 3 months after stopping it. This medicine has caused ovarian failure in some women. This medicine may make it more difficult to get pregnant. Talk to your health care professional if you are concerned about your fertility. This medicine has caused decreased sperm counts in some men. This may make it more difficult to father a child. Talk to your health care professional if you are concerned about your fertility. This medicine may increase your risk of getting an infection. Call your health care professional for advice if you get a fever, chills, or sore throat, or other symptoms of a cold or flu. Do not treat yourself. Try to avoid being around people who are sick. Avoid taking medicines that contain aspirin, acetaminophen, ibuprofen, naproxen, or ketoprofen unless instructed by your health care professional. These medicines may hide a fever. Be careful brushing or flossing your teeth or using a toothpick because you may get an infection or bleed more easily. If you have any dental work done, tell your dentist you are receiving this medicine. What side effects may I notice from receiving this medicine? Side effects that you should report to your doctor or health care professional as soon as possible:  allergic reactions like skin rash, itching or hives, swelling of the face, lips, or tongue  breathing problems  cough  low blood counts - this medicine may decrease the number of white blood cells, red blood cells, and platelets. You may be at increased risk for infections and bleeding  nausea, vomiting  pain, redness, or irritation at site where injected  pain, tingling, numbness in the hands or feet  signs and symptoms of bleeding such as bloody or black, tarry stools; red or dark brown urine; spitting up blood or brown material that looks like coffee grounds; red spots on the skin; unusual bruising or bleeding  from the eyes, gums, or nose  signs and symptoms of a dangerous change in heartbeat or heart rhythm like chest pain; dizziness; fast, irregular heartbeat; palpitations; feeling faint or lightheaded; falls  signs and symptoms of infection like fever; chills; cough; sore throat; pain or trouble passing urine  signs and symptoms of liver injury like dark yellow or brown urine; general ill feeling or flu-like symptoms; light-colored stools; loss of appetite; nausea; right upper belly pain; unusually weak or tired; yellowing of the eyes or skin  signs and symptoms of low red blood cells or anemia such as unusually weak or tired; feeling faint or lightheaded; falls  signs and symptoms of muscle injury like dark urine; trouble passing urine or change in the amount of urine; unusually weak or tired; muscle pain; back pain Side effects that usually do not require medical attention (report to your doctor or health care professional if they continue or are bothersome):  changes in taste  diarrhea  gas  hair loss  loss of appetite  mouth sores This list may not describe all possible side effects. Call your doctor for medical advice about side effects. You may report side effects to FDA at 1-800-FDA-1088. Where should I keep my medicine? This drug is given in a hospital or clinic and will not be stored at home. NOTE:   This sheet is a summary. It may not cover all possible information. If you have questions about this medicine, talk to your doctor, pharmacist, or health care provider.  2020 Elsevier/Gold Standard (2018-06-17 12:20:35) Fluorouracil, 5-FU injection What is this medicine? FLUOROURACIL, 5-FU (flure oh YOOR a sil) is a chemotherapy drug. It slows the growth of cancer cells. This medicine is used to treat many types of cancer like breast cancer, colon or rectal cancer, pancreatic cancer, and stomach cancer. This medicine may be used for other purposes; ask your health care provider or  pharmacist if you have questions. COMMON BRAND NAME(S): Adrucil What should I tell my health care provider before I take this medicine? They need to know if you have any of these conditions:  blood disorders  dihydropyrimidine dehydrogenase (DPD) deficiency  infection (especially a virus infection such as chickenpox, cold sores, or herpes)  kidney disease  liver disease  malnourished, poor nutrition  recent or ongoing radiation therapy  an unusual or allergic reaction to fluorouracil, other chemotherapy, other medicines, foods, dyes, or preservatives  pregnant or trying to get pregnant  breast-feeding How should I use this medicine? This drug is given as an infusion or injection into a vein. It is administered in a hospital or clinic by a specially trained health care professional. Talk to your pediatrician regarding the use of this medicine in children. Special care may be needed. Overdosage: If you think you have taken too much of this medicine contact a poison control center or emergency room at once. NOTE: This medicine is only for you. Do not share this medicine with others. What if I miss a dose? It is important not to miss your dose. Call your doctor or health care professional if you are unable to keep an appointment. What may interact with this medicine?  allopurinol  cimetidine  dapsone  digoxin  hydroxyurea  leucovorin  levamisole  medicines for seizures like ethotoin, fosphenytoin, phenytoin  medicines to increase blood counts like filgrastim, pegfilgrastim, sargramostim  medicines that treat or prevent blood clots like warfarin, enoxaparin, and dalteparin  methotrexate  metronidazole  pyrimethamine  some other chemotherapy drugs like busulfan, cisplatin, estramustine, vinblastine  trimethoprim  trimetrexate  vaccines Talk to your doctor or health care professional before taking any of these  medicines:  acetaminophen  aspirin  ibuprofen  ketoprofen  naproxen This list may not describe all possible interactions. Give your health care provider a list of all the medicines, herbs, non-prescription drugs, or dietary supplements you use. Also tell them if you smoke, drink alcohol, or use illegal drugs. Some items may interact with your medicine. What should I watch for while using this medicine? Visit your doctor for checks on your progress. This drug may make you feel generally unwell. This is not uncommon, as chemotherapy can affect healthy cells as well as cancer cells. Report any side effects. Continue your course of treatment even though you feel ill unless your doctor tells you to stop. In some cases, you may be given additional medicines to help with side effects. Follow all directions for their use. Call your doctor or health care professional for advice if you get a fever, chills or sore throat, or other symptoms of a cold or flu. Do not treat yourself. This drug decreases your body's ability to fight infections. Try to avoid being around people who are sick. This medicine may increase your risk to bruise or bleed. Call your doctor or health care professional if you notice any   unusual bleeding. Be careful brushing and flossing your teeth or using a toothpick because you may get an infection or bleed more easily. If you have any dental work done, tell your dentist you are receiving this medicine. Avoid taking products that contain aspirin, acetaminophen, ibuprofen, naproxen, or ketoprofen unless instructed by your doctor. These medicines may hide a fever. Do not become pregnant while taking this medicine. Women should inform their doctor if they wish to become pregnant or think they might be pregnant. There is a potential for serious side effects to an unborn child. Talk to your health care professional or pharmacist for more information. Do not breast-feed an infant while taking  this medicine. Men should inform their doctor if they wish to father a child. This medicine may lower sperm counts. Do not treat diarrhea with over the counter products. Contact your doctor if you have diarrhea that lasts more than 2 days or if it is severe and watery. This medicine can make you more sensitive to the sun. Keep out of the sun. If you cannot avoid being in the sun, wear protective clothing and use sunscreen. Do not use sun lamps or tanning beds/booths. What side effects may I notice from receiving this medicine? Side effects that you should report to your doctor or health care professional as soon as possible:  allergic reactions like skin rash, itching or hives, swelling of the face, lips, or tongue  low blood counts - this medicine may decrease the number of white blood cells, red blood cells and platelets. You may be at increased risk for infections and bleeding.  signs of infection - fever or chills, cough, sore throat, pain or difficulty passing urine  signs of decreased platelets or bleeding - bruising, pinpoint red spots on the skin, black, tarry stools, blood in the urine  signs of decreased red blood cells - unusually weak or tired, fainting spells, lightheadedness  breathing problems  changes in vision  chest pain  mouth sores  nausea and vomiting  pain, swelling, redness at site where injected  pain, tingling, numbness in the hands or feet  redness, swelling, or sores on hands or feet  stomach pain  unusual bleeding Side effects that usually do not require medical attention (report to your doctor or health care professional if they continue or are bothersome):  changes in finger or toe nails  diarrhea  dry or itchy skin  hair loss  headache  loss of appetite  sensitivity of eyes to the light  stomach upset  unusually teary eyes This list may not describe all possible side effects. Call your doctor for medical advice about side effects.  You may report side effects to FDA at 1-800-FDA-1088. Where should I keep my medicine? This drug is given in a hospital or clinic and will not be stored at home. NOTE: This sheet is a summary. It may not cover all possible information. If you have questions about this medicine, talk to your doctor, pharmacist, or health care provider.  2020 Elsevier/Gold Standard (2007-06-03 13:53:16) Leucovorin injection What is this medicine? LEUCOVORIN (loo koe VOR in) is used to prevent or treat the harmful effects of some medicines. This medicine is used to treat anemia caused by a low amount of folic acid in the body. It is also used with 5-fluorouracil (5-FU) to treat colon cancer. This medicine may be used for other purposes; ask your health care provider or pharmacist if you have questions. What should I tell my health care   provider before I take this medicine? They need to know if you have any of these conditions:  anemia from low levels of vitamin B-12 in the blood  an unusual or allergic reaction to leucovorin, folic acid, other medicines, foods, dyes, or preservatives  pregnant or trying to get pregnant  breast-feeding How should I use this medicine? This medicine is for injection into a muscle or into a vein. It is given by a health care professional in a hospital or clinic setting. Talk to your pediatrician regarding the use of this medicine in children. Special care may be needed. Overdosage: If you think you have taken too much of this medicine contact a poison control center or emergency room at once. NOTE: This medicine is only for you. Do not share this medicine with others. What if I miss a dose? This does not apply. What may interact with this medicine?  capecitabine  fluorouracil  phenobarbital  phenytoin  primidone  trimethoprim-sulfamethoxazole This list may not describe all possible interactions. Give your health care provider a list of all the medicines, herbs,  non-prescription drugs, or dietary supplements you use. Also tell them if you smoke, drink alcohol, or use illegal drugs. Some items may interact with your medicine. What should I watch for while using this medicine? Your condition will be monitored carefully while you are receiving this medicine. This medicine may increase the side effects of 5-fluorouracil, 5-FU. Tell your doctor or health care professional if you have diarrhea or mouth sores that do not get better or that get worse. What side effects may I notice from receiving this medicine? Side effects that you should report to your doctor or health care professional as soon as possible:  allergic reactions like skin rash, itching or hives, swelling of the face, lips, or tongue  breathing problems  fever, infection  mouth sores  unusual bleeding or bruising  unusually weak or tired Side effects that usually do not require medical attention (report to your doctor or health care professional if they continue or are bothersome):  constipation or diarrhea  loss of appetite  nausea, vomiting This list may not describe all possible side effects. Call your doctor for medical advice about side effects. You may report side effects to FDA at 1-800-FDA-1088. Where should I keep my medicine? This drug is given in a hospital or clinic and will not be stored at home. NOTE: This sheet is a summary. It may not cover all possible information. If you have questions about this medicine, talk to your doctor, pharmacist, or health care provider.  2020 Elsevier/Gold Standard (2007-08-04 16:50:29)  

## 2019-11-18 ENCOUNTER — Telehealth: Payer: Self-pay | Admitting: Family Medicine

## 2019-11-18 ENCOUNTER — Inpatient Hospital Stay: Payer: 59

## 2019-11-18 ENCOUNTER — Inpatient Hospital Stay (HOSPITAL_BASED_OUTPATIENT_CLINIC_OR_DEPARTMENT_OTHER): Payer: 59 | Admitting: Nurse Practitioner

## 2019-11-18 DIAGNOSIS — Z5112 Encounter for antineoplastic immunotherapy: Secondary | ICD-10-CM | POA: Diagnosis not present

## 2019-11-18 DIAGNOSIS — C159 Malignant neoplasm of esophagus, unspecified: Secondary | ICD-10-CM

## 2019-11-18 MED ORDER — LIDOCAINE-PRILOCAINE 2.5-2.5 % EX CREA
TOPICAL_CREAM | CUTANEOUS | 3 refills | Status: AC
Start: 2019-11-18 — End: ?

## 2019-11-18 NOTE — Telephone Encounter (Signed)
Jacob Henson from [facility] called stating she had received a request for records from [facility]. She states they do not have a medical records dept, so they will just need a letter from [place] with our letterhead requesting these records. This can be faxed to [phone].

## 2019-11-19 ENCOUNTER — Ambulatory Visit
Admission: RE | Admit: 2019-11-19 | Discharge: 2019-11-19 | Disposition: A | Discharge status: Home / Self Care | Payer: 59 | Attending: Vascular Surgery | Admitting: Vascular Surgery

## 2019-11-19 ENCOUNTER — Encounter
Admission: RE | Disposition: A | Discharge status: Home / Self Care | Payer: Self-pay | Source: Home / Self Care | Attending: Vascular Surgery

## 2019-11-19 ENCOUNTER — Other Ambulatory Visit (INDEPENDENT_AMBULATORY_CARE_PROVIDER_SITE_OTHER): Payer: Self-pay | Admitting: Nurse Practitioner

## 2019-11-19 ENCOUNTER — Other Ambulatory Visit: Payer: Self-pay | Admitting: *Deleted

## 2019-11-19 ENCOUNTER — Other Ambulatory Visit: Payer: Self-pay

## 2019-11-19 ENCOUNTER — Ambulatory Visit: Payer: 59 | Admitting: Radiation Oncology

## 2019-11-19 ENCOUNTER — Encounter: Payer: Self-pay | Admitting: Vascular Surgery

## 2019-11-19 DIAGNOSIS — C159 Malignant neoplasm of esophagus, unspecified: Secondary | ICD-10-CM

## 2019-11-19 DIAGNOSIS — Z8249 Family history of ischemic heart disease and other diseases of the circulatory system: Secondary | ICD-10-CM | POA: Diagnosis not present

## 2019-11-19 DIAGNOSIS — I10 Essential (primary) hypertension: Secondary | ICD-10-CM | POA: Insufficient documentation

## 2019-11-19 DIAGNOSIS — G893 Neoplasm related pain (acute) (chronic): Secondary | ICD-10-CM | POA: Diagnosis not present

## 2019-11-19 DIAGNOSIS — Z79899 Other long term (current) drug therapy: Secondary | ICD-10-CM | POA: Diagnosis not present

## 2019-11-19 DIAGNOSIS — K219 Gastro-esophageal reflux disease without esophagitis: Secondary | ICD-10-CM | POA: Diagnosis not present

## 2019-11-19 DIAGNOSIS — C7951 Secondary malignant neoplasm of bone: Secondary | ICD-10-CM | POA: Insufficient documentation

## 2019-11-19 DIAGNOSIS — Z87891 Personal history of nicotine dependence: Secondary | ICD-10-CM | POA: Diagnosis not present

## 2019-11-19 HISTORY — PX: PORTA CATH INSERTION: CATH118285

## 2019-11-19 HISTORY — DX: Malignant (primary) neoplasm, unspecified: C80.1

## 2019-11-19 SURGERY — PORTA CATH INSERTION
Anesthesia: Moderate Sedation

## 2019-11-19 MED ORDER — MIDAZOLAM HCL 5 MG/5ML IJ SOLN
INTRAMUSCULAR | Status: AC
  Filled 2019-11-19: qty 5

## 2019-11-19 MED ORDER — CHLORHEXIDINE GLUCONATE CLOTH 2 % EX PADS: 6.0000 | MEDICATED_PAD | Freq: Every day | CUTANEOUS | Status: DC

## 2019-11-19 MED ORDER — HYDROMORPHONE HCL 1 MG/ML IJ SOLN: 1.0000 mg | Freq: Once | INTRAMUSCULAR | Status: DC | PRN

## 2019-11-19 MED ORDER — SODIUM CHLORIDE 0.9 % IV SOLN
Freq: Once | INTRAVENOUS | Status: DC
  Filled 2019-11-19: qty 2

## 2019-11-19 MED ORDER — FENTANYL CITRATE (PF) 100 MCG/2ML IJ SOLN
INTRAMUSCULAR | Status: DC
Start: 2019-11-19 — End: 2019-11-20
  Filled 2019-11-19: qty 2

## 2019-11-19 MED ORDER — METHYLPREDNISOLONE SODIUM SUCC 125 MG IJ SOLR: 125.0000 mg | Freq: Once | INTRAMUSCULAR | Status: DC | PRN

## 2019-11-19 MED ORDER — MIDAZOLAM HCL 2 MG/2ML IJ SOLN
INTRAMUSCULAR | Status: DC | PRN
  Administered 2019-11-19 (×2): 1 mg via INTRAVENOUS
  Administered 2019-11-19: 2 mg via INTRAVENOUS

## 2019-11-19 MED ORDER — MIDAZOLAM HCL 2 MG/ML PO SYRP: 8.0000 mg | ORAL_SOLUTION | Freq: Once | ORAL | Status: DC | PRN

## 2019-11-19 MED ORDER — FAMOTIDINE 20 MG PO TABS: 40.0000 mg | ORAL_TABLET | Freq: Once | ORAL | Status: DC | PRN

## 2019-11-19 MED ORDER — SODIUM CHLORIDE 0.9 % IV SOLN: INTRAVENOUS | Status: DC

## 2019-11-19 MED ORDER — FENTANYL CITRATE (PF) 100 MCG/2ML IJ SOLN
INTRAMUSCULAR | Status: DC | PRN
Start: 2019-11-19 — End: 2019-11-19
  Administered 2019-11-19: 50 ug via INTRAVENOUS
  Administered 2019-11-19 (×2): 25 ug via INTRAVENOUS

## 2019-11-19 MED ORDER — DIPHENHYDRAMINE HCL 50 MG/ML IJ SOLN: 50.0000 mg | Freq: Once | INTRAMUSCULAR | Status: DC | PRN

## 2019-11-19 MED ORDER — CEFAZOLIN SODIUM-DEXTROSE 2-4 GM/100ML-% IV SOLN: 2.0000 g | Freq: Once | INTRAVENOUS | Status: AC

## 2019-11-19 MED ORDER — CEFAZOLIN SODIUM-DEXTROSE 2-4 GM/100ML-% IV SOLN
INTRAVENOUS | Status: AC
  Administered 2019-11-19: 2 g via INTRAVENOUS
  Filled 2019-11-19: qty 100

## 2019-11-19 MED ORDER — ONDANSETRON HCL 4 MG/2ML IJ SOLN: 4.0000 mg | Freq: Four times a day (QID) | INTRAMUSCULAR | Status: DC | PRN

## 2019-11-19 SURGICAL SUPPLY — 13 items
ADH SKN CLS APL DERMABOND .7 (GAUZE/BANDAGES/DRESSINGS) ×1
CANNULA 5F STIFF (CANNULA) ×3 IMPLANT
DERMABOND ADVANCED (GAUZE/BANDAGES/DRESSINGS) ×2
DERMABOND ADVANCED .7 DNX12 (GAUZE/BANDAGES/DRESSINGS) ×1 IMPLANT
KIT PORT POWER 8FR ISP CVUE (Port) ×3 IMPLANT
PACK ANGIOGRAPHY (CUSTOM PROCEDURE TRAY) ×3 IMPLANT
SPONGE XRAY 4X4 16PLY STRL (MISCELLANEOUS) ×3 IMPLANT
SUT MNCRL AB 4-0 PS2 18 (SUTURE) ×3 IMPLANT
SUT PROLENE 0 CT 1 30 (SUTURE) ×3 IMPLANT
SUT VIC AB 2-0 CT1 27 (SUTURE) ×3
SUT VIC AB 2-0 CT1 TAPERPNT 27 (SUTURE) ×1 IMPLANT
SUT VIC AB 3-0 SH 27 (SUTURE) ×3
SUT VIC AB 3-0 SH 27X BRD (SUTURE) ×1 IMPLANT

## 2019-11-19 NOTE — Progress Notes (Signed)
[place]  [phone] [phone]  Patient Care Team: Elby Beck, FNP as PCP - General (Nurse Practitioner) Clent Jacks, RN as Oncology Nurse Navigator   Name of the patient: Jacob Henson  [id number]  1960/10/29   Date of visit: 11/19/19  Diagnosis- Esophageal Cancer  Chief complaint/Reason for visit- Initial Meeting for [facility], preparing for starting chemotherapy  Heme/Onc history:  Oncology History  Esophageal adenocarcinoma ([place])  11/15/2019 Initial Diagnosis   Esophageal adenocarcinoma ([place])   11/16/2019 -  Chemotherapy   The patient had dexamethasone (DECADRON) 4 MG tablet, 8 mg, Oral, Daily, 1 of 1 cycle, Start date: --, End date: -- palonosetron (ALOXI) injection 0.25 mg, 0.25 mg, Intravenous,  Once, 0 of 6 cycles leucovorin 1,044 mg in dextrose 5 % 250 mL infusion, 400 mg/m2, Intravenous,  Once, 0 of 6 cycles oxaliplatin (ELOXATIN) 220 mg in dextrose 5 % 500 mL chemo infusion, 85 mg/m2, Intravenous,  Once, 0 of 6 cycles fluorouracil (ADRUCIL) chemo injection 1,050 mg, 400 mg/m2, Intravenous,  Once, 0 of 6 cycles fluorouracil (ADRUCIL) 6,250 mg in sodium chloride 0.9 % 125 mL chemo infusion, 2,400 mg/m2, Intravenous, 1 Day/Dose, 0 of 6 cycles  for chemotherapy treatment.      Interval history-  59 year old male patient, who presents to chemo care clinic today for initial meeting in preparation for starting chemotherapy. I introduced the chemo care clinic and we discussed that the role of the clinic is to assist those who are at an increased risk of emergency room visits and/or complications during the course of chemotherapy treatment. We discussed that the increased risk takes into account factors such as age, performance status, and co-morbidities. We also discussed that for some, this might include barriers to care such as not having a primary care provider, lack of  insurance/transportation, or not being able to afford medications. We discussed that the goal of the program is to help prevent unplanned ER visits and help reduce complications during chemotherapy. We do this by discussing specific risk factors to each individual and identifying ways that we can help improve these risk factors and reduce barriers to care.   Review of systems- Review of Systems  Constitutional: Positive for malaise/fatigue and weight loss. Negative for chills and fever.  HENT: Negative for hearing loss, nosebleeds, sore throat and tinnitus.   Eyes: Negative for blurred vision and double vision.  Respiratory: Negative for cough, hemoptysis, shortness of breath and wheezing.   Cardiovascular: Negative for chest pain, palpitations and leg swelling.  Gastrointestinal: Positive for nausea. Negative for abdominal pain, blood in stool, constipation, diarrhea, melena and vomiting.       Hiccups, early satiety  Genitourinary: Negative for dysuria and urgency.  Musculoskeletal: Positive for joint pain. Negative for back pain, falls and myalgias.  Skin: Negative for itching and rash.  Neurological: Positive for weakness. Negative for dizziness, tingling, sensory change, loss of consciousness and headaches.  Endo/Heme/Allergies: Negative for environmental allergies. Does not bruise/bleed easily.  Psychiatric/Behavioral: Negative for depression. The patient is not nervous/anxious and does not have insomnia.     Anticipated treatment: FOLFOX chemotherapy  No Known Allergies  Past Medical History:  Diagnosis Date  . Allergy   . GERD (gastroesophageal reflux disease)    Has required esophageal dilation in past  . Hypertension     Past Surgical History:  Procedure Laterality Date  . ESOPHAGEAL DILATION      Social History  Socioeconomic History  . Marital status: Married    Spouse name: Not on file  . Number of children: Not on file  . Years of education: Not on file  .  Highest education level: Not on file  Occupational History  . Occupation: full time  Tobacco Use  . Smoking status: Former Smoker    Packs/day: 1.00    Years: 4.00    Pack years: 4.00    Types: Cigarettes    Quit date: 10/14/1978    Years since quitting: 41.1  . Smokeless tobacco: Never Used  Vaping Use  . Vaping Use: Never used  Substance and Sexual Activity  . Alcohol use: No  . Drug use: Never  . Sexual activity: Not on file  Other Topics Concern  . Not on file  Social History Narrative  . Not on file   Social Determinants of Health   Financial Resource Strain:   . Difficulty of Paying Living Expenses: Not on file  Food Insecurity:   . Worried About Charity fundraiser in the Last Year: Not on file  . Ran Out of Food in the Last Year: Not on file  Transportation Needs:   . Lack of Transportation (Medical): Not on file  . Lack of Transportation (Non-Medical): Not on file  Physical Activity:   . Days of Exercise per Week: Not on file  . Minutes of Exercise per Session: Not on file  Stress:   . Feeling of Stress : Not on file  Social Connections:   . Frequency of Communication with Friends and Family: Not on file  . Frequency of Social Gatherings with Friends and Family: Not on file  . Attends Religious Services: Not on file  . Active Member of Clubs or Organizations: Not on file  . Attends Archivist Meetings: Not on file  . Marital Status: Not on file  Intimate Partner Violence:   . Fear of Current or Ex-Partner: Not on file  . Emotionally Abused: Not on file  . Physically Abused: Not on file  . Sexually Abused: Not on file    Family History  Problem Relation Age of Onset  . Heart disease Father   . Hearing loss Father   . Diabetes Father   . Heart disease Paternal Grandmother   . Diabetes Paternal Grandmother   . Heart attack Paternal Grandfather      Current Outpatient Medications:  .  fluticasone (FLONASE) 50 MCG/ACT nasal spray, Place 2  sprays into both nostrils daily., Disp: 16 g, Rfl: 6 .  levocetirizine (XYZAL) 5 MG tablet, Take by mouth., Disp: , Rfl:  .  lidocaine-prilocaine (EMLA) cream, Apply to affected area once, Disp: 30 g, Rfl: 3 .  lisinopril (ZESTRIL) 20 MG tablet, Take 1 tablet (20 mg total) by mouth daily., Disp: 90 tablet, Rfl: 3 .  OLANZapine (ZYPREXA) 10 MG tablet, Take 1 tablet (10 mg total) by mouth at bedtime., Disp: 30 tablet, Rfl: 3 .  ondansetron (ZOFRAN-ODT) 8 MG disintegrating tablet, Take by mouth., Disp: , Rfl:  .  oxyCODONE (OXY IR/ROXICODONE) 5 MG immediate release tablet, Take 1 tablet (5 mg total) by mouth every 4 (four) hours as needed for breakthrough pain., Disp: 120 tablet, Rfl: 0 .  pantoprazole (PROTONIX) 40 MG tablet, Take 1 tablet by mouth 2 (two) times daily., Disp: , Rfl:  .  prochlorperazine (COMPAZINE) 10 MG tablet, Take by mouth., Disp: , Rfl:  .  sucralfate (CARAFATE) 1 GM/10ML suspension, TAKE 10 ML ON  AN EMPTY STOMACH ORALLY FOUR TIMES A DAY 30 DAY(S), Disp: , Rfl:   Physical exam: Physical Exam Constitutional:      Appearance: He is ill-appearing.     Comments: Seen in radiation room; patient resting on couch d/t discomfort & fatigue   Pulmonary:     Effort: No respiratory distress.  Abdominal:     Comments: Frequent hiccups  Musculoskeletal:     Comments: Right arm in sling  Neurological:     Mental Status: He is alert and oriented to person, place, and time.  Psychiatric:        Mood and Affect: Mood normal.        Behavior: Behavior normal.      CMP Latest Ref Rng & Units 10/27/2019  Glucose 70 - 99 mg/dL 130(H)  BUN 6 - 23 mg/dL 22  Creatinine 0.40 - 1.50 mg/dL 1.71(H)  Sodium 135 - 145 mEq/L 133(L)  Potassium 3.5 - 5.1 mEq/L 4.3  Chloride 96 - 112 mEq/L 96  CO2 19 - 32 mEq/L 30  Calcium 8.4 - 10.5 mg/dL 9.5  Total Protein 6.0 - 8.3 g/dL 7.1  Total Bilirubin 0.2 - 1.2 mg/dL 0.5  Alkaline Phos 39 - 117 U/L 92  AST 0 - 37 U/L 19  ALT 0 - 53 U/L 28   CBC  Latest Ref Rng & Units 10/13/2019  WBC - 9.0  Hemoglobin 13.5 - 17.5 15.8  Hematocrit 41 - 53 46  Platelets 150 - 400 K/uL -    No images are attached to the encounter.  CT ABDOMEN PELVIS WO CONTRAST  Result Date: 10/21/2019 CLINICAL DATA:  Abdominal pain and 60 pound weight loss for the past 3 months. Right scapular mass recently found on MRI this week. EXAM: CT ABDOMEN AND PELVIS WITHOUT CONTRAST TECHNIQUE: Multidetector CT imaging of the abdomen and pelvis was performed following the standard protocol without IV contrast. COMPARISON:  Abdominal ultrasound dated September 06, 2019. FINDINGS: Lower chest: Lower esophageal circumferential wall thickening with mild surrounding fat stranding. The visualized lung bases are clear. Hepatobiliary: 1.0 cm cyst in the left hepatic lobe. No other focal liver abnormality. The gallbladder is unremarkable. No biliary dilatation. Pancreas: Mild fatty atrophy. No ductal dilatation or surrounding inflammatory changes. Spleen: Normal in size without focal abnormality. Adrenals/Urinary Tract: Adrenal glands are unremarkable. Kidneys are normal, without renal calculi, focal lesion, or hydronephrosis. Bladder is unremarkable for the degree of distention. Stomach/Bowel: Stomach is within normal limits. Appendix appears normal. No evidence of bowel wall thickening, distention, or inflammatory changes. Vascular/Lymphatic: No significant vascular findings are present. Prominent lymph nodes near the gastroesophageal junction measuring up to 10 mm in short axis (series 2, images 12 and 20). No additional enlarged abdominal or pelvic lymph nodes. Reproductive: Prostate is unremarkable. Other: Small fat containing bilateral inguinal hernias. No free fluid or pneumoperitoneum. Musculoskeletal: 3.9 x 2.4 cm lytic lesion in the left iliac bone with cortical destruction (series 2, image 74). Tiny lucent lesion in the L2 superior endplate (series 4, image 132). IMPRESSION: 1. 3.9 x 2.4 cm lytic  lesion in the left iliac bone. Given right scapular lesion, findings are most concerning for metastatic disease. Tiny lucent lesion in the L2 superior endplate is indeterminate but may be a small metastasis as well. 2. Lower esophageal circumferential wall thickening with mild surrounding fat stranding. Two prominent lymph nodes near the gastroesophageal junction. In light of the osseous findings, correlation with endoscopy is recommended to exclude malignancy. Electronically Signed   By:  Titus Dubin M.D.   On: 10/21/2019 16:06     Assessment and plan- Patient is a 59 y.o. male who presents to [facility] for initial meeting in preparation for starting chemotherapy for the treatment of esophageal cancer.     1. 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 Plan for FOLFOX chemotherapy. Symptomatic of malignancy. Referrals to dietician has been sent. Patient currently symptomatic of esophageal cancer with weight loss, nausea, hiccups, early satiety. HER-2 and PDL1 has been ordered. Encouraged continued use of PPI and anti-emetics for hiccups. Discussed that symptoms may improve with treatment.   2. Chemo Care Clinic/High Risk for ER/Hospitalization during chemotherapy- We discussed the role of the chemo care clinic and identified patient specific risk factors. I discussed that patient was identified as low risk primarily based on age. However, given stage and type of disease, feel patient is at high risk.   3. Social Determinants of Health- we discussed that social determinants of health may have significant impacts on health and outcomes for cancer patients.  Today we discussed specific social determinants of performance status, alcohol use, depression, financial needs, food insecurity, housing, interpersonal violence, social connections, stress, tobacco use, and transportation. We discussed the wide range of programs available through the cancer center including but not  limited to outpatient physical and occupational therapies, CARE program, psychiatry, counseling, support groups, financial assistance and counseling, social work, Building surveyor, smoking cessation, transportation assistance. Patient denies specific needs at this time.   4. Palliative Care- we discussed outpatient palliative care services available at the cancer center which is available to all patient for advanced care planning and ongoing symptom management. Will refer patient to palliative care for ongoing symptom management today.   5. Symptom Management Clinic- discussed the use of the symptom management clinic which is available for acute complaints.   We discussed methods of contacting clinic/provider. Patient denies needing specific assistance at this time.   We also discussed the role of the Symptom Management Clinic at [facility] for acute issues and methods of contacting clinic/provider. He denies needing specific assistance at this time and He will be followed by Mariea Clonts, RN (Nurse Navigator).   Visit Diagnosis 1. Esophageal adenocarcinoma ([place])    Patient expressed understanding and was in agreement with this plan. He also understands that He can call clinic at any time with any questions, concerns, or complaints.   A total of (20) minutes of face-to-face time was spent with this patient with greater than 50% of that time in counseling and care-coordination.  Beckey Rutter, DNP, AGNP-C [facility]: Billey Chang, NP

## 2019-11-19 NOTE — Telephone Encounter (Signed)
Request faxed

## 2019-11-19 NOTE — Interval H&P Note (Signed)
History and Physical Interval Note:  11/19/2019 1:14 PM  Jacob Henson  has presented today for surgery, with the diagnosis of Porta Cath Placement   Esophageal Ca  [place]  Oct 6.  The various methods of treatment have been discussed with the patient and family. After consideration of risks, benefits and other options for treatment, the patient has consented to  Procedure(s): PORTA CATH INSERTION (N/A) as a surgical intervention.  The patient's history has been reviewed, patient examined, no change in status, stable for surgery.  I have reviewed the patient's chart and labs.  Questions were answered to the patient's satisfaction.     Leotis Pain

## 2019-11-19 NOTE — Op Note (Signed)
      [facility]       Operative Note  Date: 11/19/2019  Preoperative diagnosis:  1. Esophageal cancer  Postoperative diagnosis:  Same as above  Procedures: #1. Ultrasound guidance for vascular access to the right internal jugular vein. #2. Fluoroscopic guidance for placement of catheter. #3. Placement of CT compatible Port-A-Cath, right internal jugular vein.  Surgeon: Leotis Pain, MD.   Anesthesia: Local with moderate conscious sedation for approximately 39  minutes using 4 mg of Versed and 100 mcg of Fentanyl  Fluoroscopy time: less than 1 minute  Contrast used: 0  Estimated blood loss: 3 cc  Indication for the procedure:  The patient is a 59 y.o.male with esophageal cancer.  The patient needs a Port-A-Cath for durable venous access, chemotherapy, lab draws, and CT scans. We are asked to place this. Risks and benefits were discussed and informed consent was obtained.  Description of procedure: The patient was brought to the vascular and interventional radiology suite.  Moderate conscious sedation was administered throughout the procedure during a face to face encounter with the patient with my supervision of the RN administering medicines and monitoring the patient's vital signs, pulse oximetry, telemetry and mental status throughout from the start of the procedure until the patient was taken to the recovery room. The right neck chest and shoulder were sterilely prepped and draped, and a sterile surgical field was created. Ultrasound was used to help visualize a patent right internal jugular vein. This was then accessed under direct ultrasound guidance without difficulty with the Seldinger needle and a permanent image was recorded. A J-wire was placed. After skin nick and dilatation, the peel-away sheath was then placed over the wire. I then anesthetized an area under the clavicle approximately 1-2 fingerbreadths. A transverse incision was created and an inferior  pocket was created with electrocautery and blunt dissection. The port was then brought onto the field, placed into the pocket and secured to the chest wall with 2 Prolene sutures. The catheter was connected to the port and tunneled from the subclavicular incision to the access site. Fluoroscopic guidance was then used to cut the catheter to an appropriate length. The catheter was then placed through the peel-away sheath and the peel-away sheath was removed. The catheter tip was parked in excellent location under fluorocoscopic guidance in the mid SVC. The pocket was then irrigated with antibiotic impregnated saline and the wound was closed with a running 3-0 Vicryl and a 4-0 Monocryl. The access incision was closed with a single 4-0 Monocryl. The Huber needle was used to withdraw blood and flush the port with heparinized saline. Dermabond was then placed as a dressing. The patient tolerated the procedure well and was taken to the recovery room in stable condition.   Leotis Pain 11/19/2019 2:56 PM   This note was created with Dragon Medical transcription system. Any errors in dictation are purely unintentional.

## 2019-11-19 NOTE — Discharge Instructions (Signed)
Implanted Port Removal, Care After °This sheet gives you information about how to care for yourself after your procedure. Your health care provider may also give you more specific instructions. If you have problems or questions, contact your health care provider. °What can I expect after the procedure? °After the procedure, it is common to have: °· Soreness or pain near your incision. °· Some swelling or bruising near your incision. °Follow these instructions at home: °Medicines °· Take over-the-counter and prescription medicines only as told by your health care provider. °· If you were prescribed an antibiotic medicine, take it as told by your health care provider. Do not stop taking the antibiotic even if you start to feel better. °Bathing °· Do not take baths, swim, or use a hot tub until your health care provider approves. Ask your health care provider if you can take showers. You may only be allowed to take sponge baths. °Incision care ° °· Follow instructions from your health care provider about how to take care of your incision. Make sure you: °? Wash your hands with soap and water before you change your bandage (dressing). If soap and water are not available, use hand sanitizer. °? Change your dressing as told by your health care provider. °? Keep your dressing dry. °? Leave stitches (sutures), skin glue, or adhesive strips in place. These skin closures may need to stay in place for 2 weeks or longer. If adhesive strip edges start to loosen and curl up, you may trim the loose edges. Do not remove adhesive strips completely unless your health care provider tells you to do that. °· Check your incision area every day for signs of infection. Check for: °? More redness, swelling, or pain. °? More fluid or blood. °? Warmth. °? Pus or a bad smell. °Driving ° °· Do not drive for 24 hours if you were given a medicine to help you relax (sedative) during your procedure. °· If you did not receive a sedative, ask your  health care provider when it is safe to drive. °Activity °· Return to your normal activities as told by your health care provider. Ask your health care provider what activities are safe for you. °· Do not lift anything that is heavier than 10 lb (4.5 kg), or the limit that you are told, until your health care provider says that it is safe. °· Do not do activities that involve lifting your arms over your head. °General instructions °· Do not use any products that contain nicotine or tobacco, such as cigarettes and e-cigarettes. These can delay healing. If you need help quitting, ask your health care provider. °· Keep all follow-up visits as told by your health care provider. This is important. °Contact a health care provider if: °· You have more redness, swelling, or pain around your incision. °· You have more fluid or blood coming from your incision. °· Your incision feels warm to the touch. °· You have pus or a bad smell coming from your incision. °· You have pain that is not relieved by your pain medicine. °Get help right away if you have: °· A fever or chills. °· Chest pain. °· Difficulty breathing. °Summary °· After the procedure, it is common to have pain, soreness, swelling, or bruising near your incision. °· If you were prescribed an antibiotic medicine, take it as told by your health care provider. Do not stop taking the antibiotic even if you start to feel better. °· Do not drive for 24 hours   if you were given a sedative during your procedure. °· Return to your normal activities as told by your health care provider. Ask your health care provider what activities are safe for you. °This information is not intended to replace advice given to you by your health care provider. Make sure you discuss any questions you have with your health care provider. °Document Revised: 03/13/2017 Document Reviewed: 03/13/2017 °Elsevier Patient Education © 2020 [employer] ° ° ° °Moderate Conscious Sedation, Adult, Care  After °These instructions provide you with information about caring for yourself after your procedure. Your health care provider may also give you more specific instructions. Your treatment has been planned according to current medical practices, but problems sometimes occur. Call your health care provider if you have any problems or questions after your procedure. °What can I expect after the procedure? °After your procedure, it is common: °· To feel sleepy for several hours. °· To feel clumsy and have poor balance for several hours. °· To have poor judgment for several hours. °· To vomit if you eat too soon. °Follow these instructions at home: °For at least 24 hours after the procedure: ° °· Do not: °? Participate in activities where you could fall or become injured. °? Drive. °? Use heavy machinery. °? Drink alcohol. °? Take sleeping pills or medicines that cause drowsiness. °? Make important decisions or sign legal documents. °? Take care of children on your own. °· Rest. °Eating and drinking °· Follow the diet recommended by your health care provider. °· If you vomit: °? Drink water, juice, or soup when you can drink without vomiting. °? Make sure you have little or no nausea before eating solid foods. °General instructions °· Have a responsible adult stay with you until you are awake and alert. °· Take over-the-counter and prescription medicines only as told by your health care provider. °· If you smoke, do not smoke without supervision. °· Keep all follow-up visits as told by your health care provider. This is important. °Contact a health care provider if: °· You keep feeling nauseous or you keep vomiting. °· You feel light-headed. °· You develop a rash. °· You have a fever. °Get help right away if: °· You have trouble breathing. °This information is not intended to replace advice given to you by your health care provider. Make sure you discuss any questions you have with your health care provider. °Document  Revised: 01/10/2017 Document Reviewed: 05/20/2015 °Elsevier Patient Education © 2020 [employer]. ° °

## 2019-11-22 ENCOUNTER — Other Ambulatory Visit: Payer: Self-pay | Admitting: *Deleted

## 2019-11-22 ENCOUNTER — Inpatient Hospital Stay: Payer: 59

## 2019-11-22 ENCOUNTER — Encounter: Payer: Self-pay | Admitting: Vascular Surgery

## 2019-11-22 ENCOUNTER — Other Ambulatory Visit: Payer: Self-pay

## 2019-11-22 ENCOUNTER — Telehealth: Payer: Self-pay

## 2019-11-22 ENCOUNTER — Inpatient Hospital Stay (HOSPITAL_BASED_OUTPATIENT_CLINIC_OR_DEPARTMENT_OTHER): Payer: 59 | Admitting: Oncology

## 2019-11-22 VITALS — BP 75/52 | HR 70 | Temp 98.1°F | Resp 16 | Ht 77.0 in | Wt 270.7 lb

## 2019-11-22 VITALS — BP 88/57 | HR 89

## 2019-11-22 DIAGNOSIS — Z5111 Encounter for antineoplastic chemotherapy: Secondary | ICD-10-CM

## 2019-11-22 DIAGNOSIS — R110 Nausea: Secondary | ICD-10-CM

## 2019-11-22 DIAGNOSIS — Z5112 Encounter for antineoplastic immunotherapy: Secondary | ICD-10-CM | POA: Diagnosis not present

## 2019-11-22 DIAGNOSIS — C159 Malignant neoplasm of esophagus, unspecified: Secondary | ICD-10-CM | POA: Diagnosis not present

## 2019-11-22 DIAGNOSIS — G893 Neoplasm related pain (acute) (chronic): Secondary | ICD-10-CM | POA: Diagnosis not present

## 2019-11-22 DIAGNOSIS — N189 Chronic kidney disease, unspecified: Secondary | ICD-10-CM

## 2019-11-22 DIAGNOSIS — C7951 Secondary malignant neoplasm of bone: Secondary | ICD-10-CM | POA: Diagnosis not present

## 2019-11-22 LAB — CBC WITH DIFFERENTIAL/PLATELET
Abs Immature Granulocytes: 0.05 10*3/uL (ref 0.00–0.07)
Basophils Absolute: 0.1 10*3/uL (ref 0.0–0.1)
Basophils Relative: 1 %
Eosinophils Absolute: 0.1 10*3/uL (ref 0.0–0.5)
Eosinophils Relative: 1 %
HCT: 38.9 % — ABNORMAL LOW (ref 39.0–52.0)
Hemoglobin: 13.2 g/dL (ref 13.0–17.0)
Immature Granulocytes: 1 %
Lymphocytes Relative: 10 %
Lymphs Abs: 1.1 10*3/uL (ref 0.7–4.0)
MCH: 31.4 pg (ref 26.0–34.0)
MCHC: 33.9 g/dL (ref 30.0–36.0)
MCV: 92.4 fL (ref 80.0–100.0)
Monocytes Absolute: 1.3 10*3/uL — ABNORMAL HIGH (ref 0.1–1.0)
Monocytes Relative: 13 %
Neutro Abs: 7.8 10*3/uL — ABNORMAL HIGH (ref 1.7–7.7)
Neutrophils Relative %: 74 %
Platelets: 244 10*3/uL (ref 150–400)
RBC: 4.21 MIL/uL — ABNORMAL LOW (ref 4.22–5.81)
RDW: 13.1 % (ref 11.5–15.5)
WBC: 10.5 10*3/uL (ref 4.0–10.5)
nRBC: 0 % (ref 0.0–0.2)

## 2019-11-22 LAB — COMPREHENSIVE METABOLIC PANEL
ALT: 23 U/L (ref 0–44)
AST: 23 U/L (ref 15–41)
Albumin: 3.3 g/dL — ABNORMAL LOW (ref 3.5–5.0)
Alkaline Phosphatase: 86 U/L (ref 38–126)
Anion gap: 12 (ref 5–15)
BUN: 20 mg/dL (ref 6–20)
CO2: 27 mmol/L (ref 22–32)
Calcium: 10.4 mg/dL — ABNORMAL HIGH (ref 8.9–10.3)
Chloride: 94 mmol/L — ABNORMAL LOW (ref 98–111)
Creatinine, Ser: 1.86 mg/dL — ABNORMAL HIGH (ref 0.61–1.24)
GFR, Estimated: 39 mL/min — ABNORMAL LOW (ref >60)
Glucose, Bld: 118 mg/dL — ABNORMAL HIGH (ref 70–99)
Potassium: 4 mmol/L (ref 3.5–5.1)
Sodium: 133 mmol/L — ABNORMAL LOW (ref 135–145)
Total Bilirubin: 1.1 mg/dL (ref 0.3–1.2)
Total Protein: 7.3 g/dL (ref 6.5–8.1)

## 2019-11-22 LAB — HEPATITIS B CORE ANTIBODY, TOTAL: Hep B Core Total Ab: NONREACTIVE

## 2019-11-22 LAB — HEPATITIS B SURFACE ANTIGEN: Hepatitis B Surface Ag: NONREACTIVE

## 2019-11-22 MED ORDER — OXYCODONE HCL ER 10 MG PO T12A: 10.0000 mg | EXTENDED_RELEASE_TABLET | Freq: Two times a day (BID) | ORAL | 0 refills | Status: DC

## 2019-11-22 MED ORDER — FLUOROURACIL CHEMO INJECTION 2.5 GM/50ML
383.0000 mg/m2 | Freq: Once | INTRAVENOUS | Status: AC
  Administered 2019-11-22: 1000 mg via INTRAVENOUS
  Filled 2019-11-22: qty 20

## 2019-11-22 MED ORDER — LEUCOVORIN CALCIUM INJECTION 350 MG
383.0000 mg/m2 | Freq: Once | INTRAVENOUS | Status: AC
  Administered 2019-11-22: 1000 mg via INTRAVENOUS
  Filled 2019-11-22: qty 50

## 2019-11-22 MED ORDER — PROCHLORPERAZINE MALEATE 10 MG PO TABS: 10.0000 mg | ORAL_TABLET | Freq: Four times a day (QID) | ORAL | 2 refills | Status: DC | PRN

## 2019-11-22 MED ORDER — SODIUM CHLORIDE 0.9 % IV SOLN
Freq: Once | INTRAVENOUS | Status: AC
  Filled 2019-11-22: qty 250

## 2019-11-22 MED ORDER — ONDANSETRON 8 MG PO TBDP: 8.0000 mg | ORAL_TABLET | Freq: Three times a day (TID) | ORAL | 1 refills | Status: AC | PRN

## 2019-11-22 MED ORDER — PALONOSETRON HCL INJECTION 0.25 MG/5ML
0.2500 mg | Freq: Once | INTRAVENOUS | Status: AC
  Administered 2019-11-22: 0.25 mg via INTRAVENOUS
  Filled 2019-11-22: qty 5

## 2019-11-22 MED ORDER — SODIUM CHLORIDE 0.9 % IV SOLN
2400.0000 mg/m2 | INTRAVENOUS | Status: DC
  Administered 2019-11-22: 6250 mg via INTRAVENOUS
  Filled 2019-11-22: qty 125

## 2019-11-22 MED ORDER — OXALIPLATIN CHEMO INJECTION 100 MG/20ML
65.0000 mg/m2 | Freq: Once | INTRAVENOUS | Status: AC
  Administered 2019-11-22: 170 mg via INTRAVENOUS
  Filled 2019-11-22: qty 34

## 2019-11-22 MED ORDER — DEXTROSE 5 % IV SOLN
Freq: Once | INTRAVENOUS | Status: AC
  Filled 2019-11-22: qty 250

## 2019-11-22 MED ORDER — SODIUM CHLORIDE 0.9 % IV SOLN
10.0000 mg | Freq: Once | INTRAVENOUS | Status: AC
  Administered 2019-11-22: 10 mg via INTRAVENOUS
  Filled 2019-11-22: qty 10

## 2019-11-22 MED ORDER — SODIUM CHLORIDE 0.9% FLUSH
10.0000 mL | INTRAVENOUS | Status: DC | PRN
  Administered 2019-11-22: 10 mL via INTRAVENOUS
  Filled 2019-11-22: qty 10

## 2019-11-22 NOTE — Progress Notes (Signed)
Hematology/Oncology Consult note [facility]  [phone] Fax:([phone]  Patient Care Team: Elby Beck, FNP as PCP - General (Nurse Practitioner) Clent Jacks, RN as Oncology Nurse Navigator   Name of the patient: Jacob Henson  [id number]  01/05/1961   Date of visit: 11/22/19  Diagnosis-metastatic esophageal adenocarcinoma with bone metastases  Chief complaint/ Reason for visit-on treatment assessment prior to cycle 1 of FOLFOX chemotherapy  Heme/Onc history: Patient is a 59 year old male who was admitted to [facility] with symptoms of nausea vomiting poor appetite as well as worsening right scapular pain he underwent CT chest abdomen pelvis with contrast on 11/09/2019 which showed mural thickening of the lower one third of the esophagus consistent with esophageal malignancy.  Soft tissue lesions in the right scapula and left iliac crest consistent with osseous metastatic disease.  Soft tissue nodules at the celiac trunk and the GE junction concerning for metastatic lymphadenopathy.  Nonspecific left upper lobe pulmonary nodule and right paracolic peritoneal nodule.  He underwent FNA of the right scapular lytic lesion which was consistent with adenocarcinoma.  Malignant cells positive for CK7 and CK20 and negative for TTF-1 and PAX 3 and CDX2 was negative.  Differential diagnosis includes pancreaticobiliary and GI origin.CEA elevated at 135.  CBC was unremarkable.  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  Patient will  also be seeing radiation oncology  Interval history-reports right shoulder pain is not well controlled with as needed oxycodone.  Bowel movements are regular.  He was asked to stop his lisinopril upon his hospital discharge but he went back to taking it again.  Blood pressure today 75/52.  He does not feel lightheaded.  Reports ongoing fatigue.  Nausea is better controlled with Zyprexa  ECOG PS- 1 Pain scale- 4 Opioid associated constipation- no  Review of systems- Review of Systems  Constitutional: Positive for malaise/fatigue. Negative for chills, fever and weight loss.  HENT: Negative for congestion, ear discharge and nosebleeds.   Eyes: Negative for blurred vision.  Respiratory: Negative for cough, hemoptysis, sputum production, shortness of breath and wheezing.   Cardiovascular: Negative for chest pain, palpitations, orthopnea and claudication.  Gastrointestinal: Negative for abdominal pain, blood in stool, constipation, diarrhea, heartburn, melena, nausea and vomiting.  Genitourinary: Negative for dysuria, flank pain, frequency, hematuria and urgency.  Musculoskeletal: Negative for back pain, joint pain and myalgias.       Right shoulder pain  Skin: Negative for rash.  Neurological: Negative for dizziness, tingling, focal weakness, seizures, weakness and headaches.  Endo/Heme/Allergies: Does not bruise/bleed easily.  Psychiatric/Behavioral: Negative for depression and suicidal ideas. The patient does not have insomnia.        No Known Allergies   Past Medical History:  Diagnosis Date  . Allergy   . Cancer ([place])   . GERD (gastroesophageal reflux disease)    Has required esophageal dilation in past  . Hypertension      Past Surgical History:  Procedure Laterality Date  . ESOPHAGEAL DILATION    . PORTA CATH INSERTION N/A 11/19/2019   Procedure: PORTA CATH INSERTION;  Surgeon: Algernon Huxley, MD;  Location: [place]  [facility];  Service: Cardiovascular;  Laterality: N/A;     Social History   Socioeconomic History  . Marital status: Married    Spouse name: Not on file  . Number of children: Not on file  . Years of education: Not on file  . Highest education level: Not on file  Occupational History  . Occupation: full time  Tobacco Use  . Smoking status: Former Smoker    Packs/day: 1.00    Years: 4.00    Pack years: 4.00    Types: Cigarettes    Quit date: 10/14/1978    Years since quitting: 41.1  . Smokeless tobacco: Never Used  Vaping Use  . Vaping Use: Never used  Substance and Sexual Activity  . Alcohol use: No  . Drug use: Never  . Sexual activity: Not on file  Other Topics Concern  . Not on file  Social History Narrative  . Not on file   Social Determinants of Health   Financial Resource Strain:   . Difficulty of Paying Living Expenses: Not on file  Food Insecurity:   . Worried About Charity fundraiser in the Last Year: Not on file  . Ran Out of Food in the Last Year: Not on file  Transportation Needs:   . Lack of Transportation (Medical): Not on file  . Lack of Transportation (Non-Medical): Not on file  Physical Activity:   . Days of Exercise per Week: Not on file  . Minutes of Exercise per Session: Not on file  Stress:   . Feeling of Stress : Not on file  Social Connections:   . Frequency of Communication with Friends and Family: Not on file  . Frequency of Social Gatherings with Friends and Family: Not on file  . Attends Religious Services: Not on file  . Active Member of Clubs or Organizations: Not on file  . Attends Archivist Meetings: Not on file  . Marital Status: Not on file  Intimate Partner Violence:   . Fear of Current or Ex-Partner: Not on file  . Emotionally Abused: Not on file  . Physically Abused: Not on file  . Sexually Abused: Not on file    Family History  Problem Relation Age of Onset  . Heart disease Father   . Hearing loss Father   . Diabetes Father   . Heart disease Paternal  Grandmother   . Diabetes Paternal Grandmother   . Heart attack Paternal Grandfather      Current Outpatient Medications:  .  fluticasone (FLONASE) 50 MCG/ACT nasal spray, Place 2 sprays into both nostrils daily., Disp: 16 g, Rfl: 6 .  levocetirizine (XYZAL) 5 MG tablet, Take 5 mg by mouth daily. , Disp: , Rfl:  .  lidocaine-prilocaine (EMLA) cream, Apply to affected area once, Disp: 30 g, Rfl: 3 .  lisinopril (ZESTRIL) 20 MG tablet, Take 1 tablet (20 mg total) by mouth daily., Disp: 90 tablet, Rfl: 3 .  OLANZapine (ZYPREXA) 10 MG tablet, Take 1 tablet (10 mg total) by mouth at bedtime., Disp: 30 tablet, Rfl: 3 .  ondansetron (ZOFRAN-ODT) 8 MG disintegrating tablet, Take by mouth., Disp: , Rfl:  .  oxyCODONE (OXY IR/ROXICODONE) 5 MG immediate release tablet, Take 1 tablet (5 mg total) by mouth every 4 (four) hours as needed for breakthrough pain., Disp: 120 tablet, Rfl: 0 .  pantoprazole (PROTONIX) 40 MG tablet, Take 1 tablet by mouth 2 (two) times daily., Disp: , Rfl:  .  prochlorperazine (COMPAZINE) 10 MG  tablet, Take by mouth., Disp: , Rfl:  .  sucralfate (CARAFATE) 1 GM/10ML suspension, TAKE 10 ML ON AN EMPTY STOMACH ORALLY FOUR TIMES A DAY 30 DAY(S), Disp: , Rfl:  No current facility-administered medications for this visit.  Facility-Administered Medications Ordered in Other Visits:  .  sodium chloride flush (NS) 0.9 % injection 10 mL, 10 mL, Intravenous, PRN, Sindy Guadeloupe, MD, 10 mL at 11/22/19 0820  Physical exam:  Vitals:   11/22/19 0844  BP: (!) 75/52  Pulse: 70  Resp: 16  Temp: 98.1 F (36.7 C)  TempSrc: Oral  Weight: 270 lb 11.2 oz (122.8 kg)  Height: _0  (1.956 m)   Physical Exam Constitutional:      General: He is not in acute distress. Cardiovascular:     Rate and Rhythm: Regular rhythm. Tachycardia present.     Heart sounds: Normal heart sounds.  Pulmonary:     Effort: Pulmonary effort is normal.     Breath sounds: Normal breath sounds.  Abdominal:      General: Bowel sounds are normal.     Palpations: Abdomen is soft.  Skin:    General: Skin is warm and dry.  Neurological:     Mental Status: He is alert and oriented to person, place, and time.      CMP Latest Ref Rng & Units 11/22/2019  Glucose 70 - 99 mg/dL 118(H)  BUN 6 - 20 mg/dL 20  Creatinine 0.61 - 1.24 mg/dL 1.86(H)  Sodium 135 - 145 mmol/L 133(L)  Potassium 3.5 - 5.1 mmol/L 4.0  Chloride 98 - 111 mmol/L 94(L)  CO2 22 - 32 mmol/L 27  Calcium 8.9 - 10.3 mg/dL 10.4(H)  Total Protein 6.5 - 8.1 g/dL 7.3  Total Bilirubin 0.3 - 1.2 mg/dL 1.1  Alkaline Phos 38 - 126 U/L 86  AST 15 - 41 U/L 23  ALT 0 - 44 U/L 23   CBC Latest Ref Rng & Units 11/22/2019  WBC 4.0 - 10.5 K/uL 10.5  Hemoglobin 13.0 - 17.0 g/dL 13.2  Hematocrit 39 - 52 % 38.9(L)  Platelets 150 - 400 K/uL 244    No images are attached to the encounter.  PERIPHERAL VASCULAR CATHETERIZATION  Result Date: 11/19/2019 See op note    Assessment and plan- Patient is a 59 y.o. male with stage IV esophageal adenocarcinoma with bone metastases here for on treatment assessment prior to cycle 1 of palliative FOLFOX chemotherapy  Counts okay to proceed with cycle 1 of palliative FOLFOX chemotherapy today.  He will come on day 3 for pump disconnect and I will see him back in 2 weeks for cycle 2 and for possible IV fluids as well.  Patient will be seeing Dr. Donella Stade from radiation oncology tomorrow and it remains to be seen if he will be receiving palliative radiation to his shoulder or to his esophagus as well.  I will need to adjust chemotherapy based on that.  We are currently awaiting on PD-L1, MSI as well as HER-2 status on his biopsy specimen  Neoplasm related pain: He will increase his oxycodone to 10 mg every 4 hours as needed and we will also add a long-acting medication like OxyContin 10 mg twice daily.  Shoulder pain should hopefully start getting better after he receives palliative radiation  Bone metastases:  Again discussed the role of bisphosphonates to reduce the chances of skeletal related fractures.  He does not have a dentist and will think about pros and cons before proceeding with that  without dental clearance  Cancer-induced nausea: Continue Zyprexa and we will renew his as needed Zofran and Compazine  Hypotension: He will receive 1 L of IV fluids today.  I have asked him to stop taking his lisinopril at this time and keep a tab of blood pressure readings at home.  CKD: Serum creatinine has been waxing and waning and today it is 1.8 overall stable as compared to prior values.  Will also receive 1 L of IV fluids today   Visit Diagnosis 1. Encounter for antineoplastic chemotherapy   2. Esophageal adenocarcinoma ([place])   3. Bone metastases ([place])   4. Neoplasm related pain   5. Nausea      Dr. Randa Evens, MD, MPH [facility] [phone] 11/22/2019 9:32 AM

## 2019-11-22 NOTE — Progress Notes (Signed)
BP 88/57 after 1L NS. Proceed with treatment today per Dr Janese Banks

## 2019-11-22 NOTE — Telephone Encounter (Signed)
At the request of Dr. Janese Banks, MSI testing requested on [id number], esophageal biopsy, collected 10/29/2019. Block is located at [facility].

## 2019-11-23 ENCOUNTER — Encounter: Payer: Self-pay | Admitting: Radiation Oncology

## 2019-11-23 ENCOUNTER — Ambulatory Visit
Admission: RE | Admit: 2019-11-23 | Discharge: 2019-11-23 | Disposition: A | Discharge status: Home / Self Care | Payer: 59 | Source: Ambulatory Visit | Attending: Oncology | Admitting: Oncology

## 2019-11-23 ENCOUNTER — Ambulatory Visit
Admission: RE | Admit: 2019-11-23 | Discharge: 2019-11-23 | Disposition: A | Discharge status: Home / Self Care | Payer: 59 | Source: Ambulatory Visit | Attending: Radiation Oncology | Admitting: Radiation Oncology

## 2019-11-23 ENCOUNTER — Other Ambulatory Visit: Payer: Self-pay

## 2019-11-23 ENCOUNTER — Telehealth: Payer: Self-pay

## 2019-11-23 VITALS — BP 118/80 | HR 64 | Resp 16 | Wt 275.4 lb

## 2019-11-23 DIAGNOSIS — K219 Gastro-esophageal reflux disease without esophagitis: Secondary | ICD-10-CM | POA: Insufficient documentation

## 2019-11-23 DIAGNOSIS — Z79899 Other long term (current) drug therapy: Secondary | ICD-10-CM | POA: Insufficient documentation

## 2019-11-23 DIAGNOSIS — R630 Anorexia: Secondary | ICD-10-CM | POA: Insufficient documentation

## 2019-11-23 DIAGNOSIS — Z6832 Body mass index (BMI) 32.0-32.9, adult: Secondary | ICD-10-CM | POA: Diagnosis not present

## 2019-11-23 DIAGNOSIS — C7951 Secondary malignant neoplasm of bone: Secondary | ICD-10-CM | POA: Insufficient documentation

## 2019-11-23 DIAGNOSIS — C159 Malignant neoplasm of esophagus, unspecified: Secondary | ICD-10-CM | POA: Insufficient documentation

## 2019-11-23 DIAGNOSIS — I10 Essential (primary) hypertension: Secondary | ICD-10-CM | POA: Insufficient documentation

## 2019-11-23 DIAGNOSIS — C155 Malignant neoplasm of lower third of esophagus: Secondary | ICD-10-CM | POA: Insufficient documentation

## 2019-11-23 DIAGNOSIS — Z87891 Personal history of nicotine dependence: Secondary | ICD-10-CM | POA: Insufficient documentation

## 2019-11-23 DIAGNOSIS — R634 Abnormal weight loss: Secondary | ICD-10-CM | POA: Diagnosis not present

## 2019-11-23 DIAGNOSIS — G893 Neoplasm related pain (acute) (chronic): Secondary | ICD-10-CM | POA: Insufficient documentation

## 2019-11-23 LAB — CEA: CEA: 171 ng/mL — ABNORMAL HIGH (ref 0.0–4.7)

## 2019-11-23 LAB — HEPATITIS B SURFACE ANTIBODY, QUANTITATIVE: Hep B S AB Quant (Post): <3.1 m[IU]/mL — ABNORMAL LOW (ref >9.9)

## 2019-11-23 LAB — GLUCOSE, CAPILLARY: Glucose-Capillary: 121 mg/dL — ABNORMAL HIGH (ref 70–99)

## 2019-11-23 IMAGING — CT NM PET TUM IMG INITIAL (PI) SKULL BASE T - THIGH
1 of 9 series · 2 of 25 positions shown · non-contrast
Comparison: CT abdomen/pelvis dated [DATE]. MRI right shoulder
dated [DATE].

CLINICAL DATA: Initial treatment strategy for metastatic esophageal
adenocarcinoma. Known right scapular metastasis.

EXAM:
NUCLEAR MEDICINE PET SKULL BASE TO THIGH
TECHNIQUE: 15.4 mCi F-18 FDG was injected intravenously. Full-ring PET imaging
was performed from the skull base to thigh after the radiotracer. CT
data was obtained and used for attenuation correction and anatomic
localization.
Fasting blood glucose: 121 mg/dl

[Series 3: ct wb 5.0 b30f · axial · 5.0mm · 0.98mm/px · z∈[-422,+680]mm · 2 of 368 slices shown]
[im 1/368  brain]
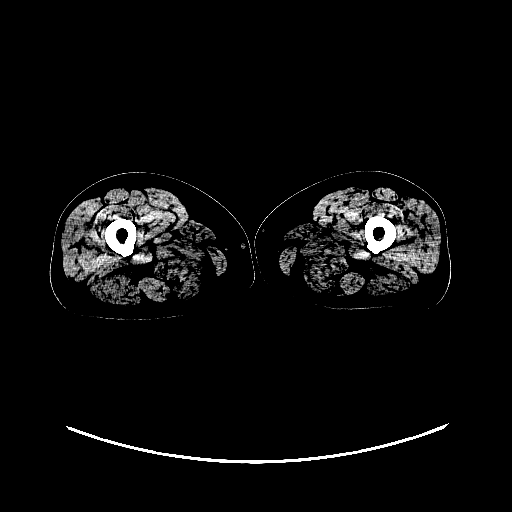
[im 368/368  brain]
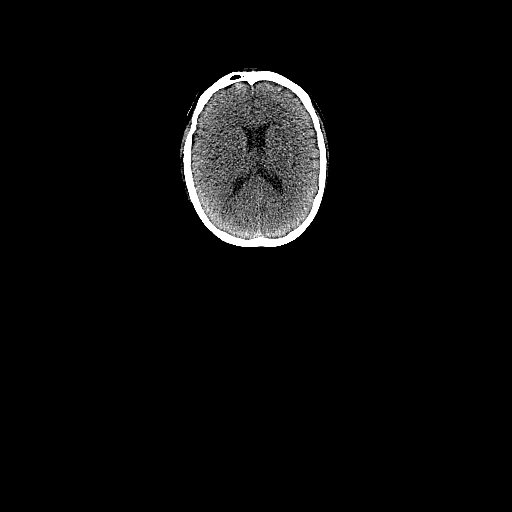

[2 of 25 positions shown; findings below may reference images not displayed]

FINDINGS: Mediastinal blood pool activity: SUV max

Liver activity: SUV max NA

NECK: No hypermetabolic cervical lymphadenopathy.

Incidental CT findings: none

CHEST: Mass involving the lower 3rd of the esophagus (series 3/image
123), max SUV 15.0, corresponding to the patient's known primary
esophageal neoplasm.

10 mm anterior left upper lobe nodule (series 3/image 85), max SUV
4.0, compatible with pulmonary metastasis.

No hypermetabolic thoracic lymphadenopathy.

Right chest port terminates in the lower SVC.

Incidental CT findings: Mild atherosclerotic calcifications of the
aortic arch.

ABDOMEN/PELVIS: 3.0 cm right adrenal metastasis (series 3/image
152), max SUV 9.0.

8 mm short axis celiac axis node (series 3/image 149), max SUV 6.4.

Incidental CT findings: Layering gallbladder sludge versus
noncalcified gallstones. Mild atherosclerotic calcifications of the
abdominal aorta and branch vessels.

SKELETON: Widespread osseous metastases throughout the visualized
axial and appendicular skeleton, including:

--Destructive right scapular mass, max SUV

--Right sternum, max SUV

--Right 5th costosternal junction, max SUV

--T12 vertebral body, max SUV

--Destructive left iliac bone mass, max SUV

--Right proximal femoral shaft, max SUV

Incidental CT findings: Degenerative changes of the visualized
thoracolumbar spine.
IMPRESSION: Distal esophageal mass, corresponding to the patient's known primary
esophageal neoplasm.

Left upper lobe pulmonary metastasis. Right adrenal metastasis.
Celiac axis nodal metastasis.

Widespread osseous metastases, including destructive lesions in the
right scapula and left iliac bone, as above.

## 2019-11-23 MED ORDER — FLUDEOXYGLUCOSE F - 18 (FDG) INJECTION
14.0000 | Freq: Once | INTRAVENOUS | Status: AC | PRN
  Administered 2019-11-23: 15.4 via INTRAVENOUS

## 2019-11-23 NOTE — Consult Note (Signed)
NEW PATIENT EVALUATION  Name: Jacob Henson  MRN: [id number]  Date:   11/23/2019     DOB: 24-Nov-1960   This 59 y.o. male patient presents to the clinic for initial evaluation of stage IV adenocarcinoma of the distal esophagus with bone metastasis.  REFERRING PHYSICIAN: Elby Beck, FNP  CHIEF COMPLAINT:  Chief Complaint  Patient presents with  . Esophageal Cancer    DIAGNOSIS: The encounter diagnosis was Esophageal adenocarcinoma ([place]).   PREVIOUS INVESTIGATIONS:  PET/CT and CT scans reviewed Pathology report reviewed Clinical notes reviewed  HPI: Patient is a 59 year old male who presented with poor appetite weight loss and worsening dysphagia.  He also had significant right scapular pain.  Initial CT scan in September showed mural thickening the lower one third of the esophagus consistent with malignancy.  He had a lesion in the right scapula as well as iliac crest consistent with osseous metastatic disease.  Also had adenopathy at the celiac trunk and GE junction concerning for metastatic lymphadenopathy.  He underwent an FNA of his right scapular lytic lesion which was consistent with adenocarcinoma consistent with esophageal primary.  He went underwent a EGD in [place] showing partially obstructing mass in the lower third of the esophagus which again was biopsied and showed ulcerated moderately to poorly differentiated adenocarcinoma with signet cell differentiation arising intestinal metaplasia.  He has been seen by medical oncology has been started on FOLFOX.  Continues to have some significant right scapular pain.  He had a PET CT scan today which has not been officially read but on my review shows multiple areas of bone metastasis including the scapula spine and pelvis.  He also has hypermetabolic activity in the distal third of his esophagus consistent with his known esophageal primary.  Seen today for consideration of palliative treatment.  PLANNED TREATMENT  REGIMEN: Palliative radiation therapy to both right scapula as well as esophagus  PAST MEDICAL HISTORY:  has a past medical history of Allergy, Cancer ([place]), GERD (gastroesophageal reflux disease), and Hypertension.    PAST SURGICAL HISTORY:  Past Surgical History:  Procedure Laterality Date  . ESOPHAGEAL DILATION    . PORTA CATH INSERTION N/A 11/19/2019   Procedure: PORTA CATH INSERTION;  Surgeon: Algernon Huxley, MD;  Location: [place] [facility];  Service: Cardiovascular;  Laterality: N/A;    FAMILY HISTORY: family history includes Diabetes in his father and paternal grandmother; Hearing loss in his father; Heart attack in his paternal grandfather; Heart disease in his father and paternal grandmother.  SOCIAL HISTORY:  reports that he quit smoking about 41 years ago. His smoking use included cigarettes. He has a 4.00 pack-year smoking history. He has never used smokeless tobacco. He reports that he does not drink alcohol and does not use drugs.  ALLERGIES: Patient has no known allergies.  MEDICATIONS:  Current Outpatient Medications  Medication Sig Dispense Refill  . fluticasone (FLONASE) 50 MCG/ACT nasal spray Place 2 sprays into both nostrils daily. 16 g 6  . levocetirizine (XYZAL) 5 MG tablet Take 5 mg by mouth daily.     Marland Kitchen lidocaine-prilocaine (EMLA) cream Apply to affected area once 30 g 3  . lisinopril (ZESTRIL) 20 MG tablet Take 1 tablet (20 mg total) by mouth daily. 90 tablet 3  . OLANZapine (ZYPREXA) 10 MG tablet Take 1 tablet (10 mg total) by mouth at bedtime. 30 tablet 3  . ondansetron (ZOFRAN-ODT) 8 MG disintegrating tablet Take 1 tablet (8 mg total) by mouth every 8 (eight) hours as  needed for nausea or vomiting. 30 tablet 1  . oxyCODONE (OXY IR/ROXICODONE) 5 MG immediate release tablet Take 1 tablet (5 mg total) by mouth every 4 (four) hours as needed for breakthrough pain. 120 tablet 0  . oxyCODONE (OXYCONTIN) 10 mg 12 hr tablet Take 1 tablet (10 mg total) by mouth every  12 (twelve) hours. 60 tablet 0  . pantoprazole (PROTONIX) 40 MG tablet Take 1 tablet by mouth 2 (two) times daily.    . prochlorperazine (COMPAZINE) 10 MG tablet Take 1 tablet (10 mg total) by mouth every 6 (six) hours as needed for nausea or vomiting. 30 tablet 2  . sucralfate (CARAFATE) 1 GM/10ML suspension TAKE 10 ML ON AN EMPTY STOMACH ORALLY FOUR TIMES A DAY 30 DAY(S)     No current facility-administered medications for this encounter.    ECOG PERFORMANCE STATUS:  1 - Symptomatic but completely ambulatory  REVIEW OF SYSTEMS: Patient denies any weight loss, fatigue, weakness, fever, chills or night sweats. Patient denies any loss of vision, blurred vision. Patient denies any ringing  of the ears or hearing loss. No irregular heartbeat. Patient denies heart murmur or history of fainting. Patient denies any chest pain or pain radiating to her upper extremities. Patient denies any shortness of breath, difficulty breathing at night, cough or hemoptysis. Patient denies any swelling in the lower legs. Patient denies any nausea vomiting, vomiting of blood, or coffee ground material in the vomitus. Patient denies any stomach pain. Patient states has had normal bowel movements no significant constipation or diarrhea. Patient denies any dysuria, hematuria or significant nocturia. Patient denies any problems walking, swelling in the joints or loss of balance. Patient denies any skin changes, loss of hair or loss of weight. Patient denies any excessive worrying or anxiety or significant depression. Patient denies any problems with insomnia. Patient denies excessive thirst, polyuria, polydipsia. Patient denies any swollen glands, patient denies easy bruising or easy bleeding. Patient denies any recent infections, allergies or URI. Patient "s visual fields have not changed significantly in recent time.   PHYSICAL EXAM: BP 118/80 (BP Location: Left Arm, Patient Position: Sitting)   Pulse 64   Resp 16   Wt 275  lb 6.4 oz (124.9 kg)   BMI 32.66 kg/m  Well-developed well-nourished patient in NAD. HEENT reveals PERLA, EOMI, discs not visualized.  Oral cavity is clear. No oral mucosal lesions are identified. Neck is clear without evidence of cervical or supraclavicular adenopathy. Lungs are clear to A&P. Cardiac examination is essentially unremarkable with regular rate and rhythm without murmur rub or thrill. Abdomen is benign with no organomegaly or masses noted. Motor sensory and DTR levels are equal and symmetric in the upper and lower extremities. Cranial nerves II through XII are grossly intact. Proprioception is intact. No peripheral adenopathy or edema is identified. No motor or sensory levels are noted. Crude visual fields are within normal range.  LABORATORY DATA: Pathology report reviewed    RADIOLOGY RESULTS: CT scans and PET CT scans reviewed compatible with above-stated findings   IMPRESSION: Stage IV adenocarcinoma the distal esophagus with bone metastasis in 59 year old male  PLAN: At this time like to address his right scapular metastasis versus this is an area of most pain.  Would plan on a single fraction of 800 centigrade to his right scapula.  I have personally set up and ordered CT simulation.  Risks and benefits of that treatment occluding possible skin reaction and fatigue were discussed with the patient.  I am also  going to send him after pain is under control in about a week or 2 for palliative radiation therapy to his distal esophagus.  Would plan on 4000 cGy over 4 weeks and evaluate for response.  I have discussed the case personally with medical oncology who will continue FOLFOX chemotherapy which should be fine during my treatments.  Risks and benefits of esophageal radiation including possible worsening of dysphagia initially fatigue alteration of blood counts skin reaction all were discussed in detail.  Patient comprehends my recommendations well.  I would like to take this  opportunity to thank you for allowing me to participate in the care of your patient.Noreene Filbert, MD

## 2019-11-23 NOTE — Telephone Encounter (Signed)
T/C to pt and spoke to wife.   She states infusion went great but pt had some nausea and vomiting early.   Encouraged wife to give pt his antinausea meds and to call if he is not better.  States he has been drinking fluids and chicken broth.

## 2019-11-24 ENCOUNTER — Inpatient Hospital Stay: Payer: 59

## 2019-11-24 VITALS — BP 119/77 | HR 80 | Temp 97.4°F | Resp 20

## 2019-11-24 DIAGNOSIS — C159 Malignant neoplasm of esophagus, unspecified: Secondary | ICD-10-CM

## 2019-11-24 DIAGNOSIS — Z5112 Encounter for antineoplastic immunotherapy: Secondary | ICD-10-CM | POA: Diagnosis not present

## 2019-11-24 MED ORDER — SODIUM CHLORIDE 0.9% FLUSH
10.0000 mL | INTRAVENOUS | Status: DC | PRN
  Administered 2019-11-24: 10 mL
  Filled 2019-11-24: qty 10

## 2019-11-24 MED ORDER — HEPARIN SOD (PORK) LOCK FLUSH 100 UNIT/ML IV SOLN
INTRAVENOUS | Status: AC
  Filled 2019-11-24: qty 5

## 2019-11-24 MED ORDER — HEPARIN SOD (PORK) LOCK FLUSH 100 UNIT/ML IV SOLN
500.0000 [IU] | Freq: Once | INTRAVENOUS | Status: AC | PRN
  Administered 2019-11-24: 500 [IU]
  Filled 2019-11-24: qty 5

## 2019-11-25 ENCOUNTER — Other Ambulatory Visit: Payer: Self-pay

## 2019-11-25 DIAGNOSIS — C159 Malignant neoplasm of esophagus, unspecified: Secondary | ICD-10-CM

## 2019-11-25 DIAGNOSIS — M25811 Other specified joint disorders, right shoulder: Secondary | ICD-10-CM

## 2019-11-25 NOTE — Progress Notes (Signed)
All slides have been received. Delivered to pathology for consult/comparison

## 2019-11-26 ENCOUNTER — Telehealth: Payer: Self-pay | Admitting: Surgery

## 2019-11-26 ENCOUNTER — Other Ambulatory Visit: Payer: Self-pay

## 2019-11-26 ENCOUNTER — Inpatient Hospital Stay: Payer: 59

## 2019-11-26 ENCOUNTER — Inpatient Hospital Stay (HOSPITAL_BASED_OUTPATIENT_CLINIC_OR_DEPARTMENT_OTHER): Payer: 59 | Admitting: Oncology

## 2019-11-26 ENCOUNTER — Telehealth: Payer: Self-pay | Admitting: *Deleted

## 2019-11-26 ENCOUNTER — Encounter: Payer: Self-pay | Admitting: Oncology

## 2019-11-26 VITALS — BP 136/73 | HR 104 | Temp 97.8°F | Resp 16

## 2019-11-26 VITALS — BP 116/61 | HR 130 | Temp 96.5°F | Resp 16 | Wt 267.3 lb

## 2019-11-26 DIAGNOSIS — R1319 Other dysphagia: Secondary | ICD-10-CM

## 2019-11-26 DIAGNOSIS — R110 Nausea: Secondary | ICD-10-CM

## 2019-11-26 DIAGNOSIS — R000 Tachycardia, unspecified: Secondary | ICD-10-CM | POA: Diagnosis not present

## 2019-11-26 DIAGNOSIS — E860 Dehydration: Secondary | ICD-10-CM | POA: Diagnosis not present

## 2019-11-26 DIAGNOSIS — R634 Abnormal weight loss: Secondary | ICD-10-CM

## 2019-11-26 DIAGNOSIS — C159 Malignant neoplasm of esophagus, unspecified: Secondary | ICD-10-CM

## 2019-11-26 DIAGNOSIS — Z5112 Encounter for antineoplastic immunotherapy: Secondary | ICD-10-CM | POA: Diagnosis not present

## 2019-11-26 DIAGNOSIS — C7951 Secondary malignant neoplasm of bone: Secondary | ICD-10-CM

## 2019-11-26 LAB — COMPREHENSIVE METABOLIC PANEL
ALT: 20 U/L (ref 0–44)
AST: 21 U/L (ref 15–41)
Albumin: 3.4 g/dL — ABNORMAL LOW (ref 3.5–5.0)
Alkaline Phosphatase: 72 U/L (ref 38–126)
Anion gap: 13 (ref 5–15)
BUN: 22 mg/dL — ABNORMAL HIGH (ref 6–20)
CO2: 26 mmol/L (ref 22–32)
Calcium: 9.5 mg/dL (ref 8.9–10.3)
Chloride: 94 mmol/L — ABNORMAL LOW (ref 98–111)
Creatinine, Ser: 1.29 mg/dL — ABNORMAL HIGH (ref 0.61–1.24)
GFR, Estimated: >60 mL/min (ref >60)
Glucose, Bld: 123 mg/dL — ABNORMAL HIGH (ref 70–99)
Potassium: 3.9 mmol/L (ref 3.5–5.1)
Sodium: 133 mmol/L — ABNORMAL LOW (ref 135–145)
Total Bilirubin: 1.4 mg/dL — ABNORMAL HIGH (ref 0.3–1.2)
Total Protein: 7.3 g/dL (ref 6.5–8.1)

## 2019-11-26 LAB — PROTIME-INR
INR: 1.3 — ABNORMAL HIGH (ref 0.8–1.2)
Prothrombin Time: 15.7 seconds — ABNORMAL HIGH (ref 11.4–15.2)

## 2019-11-26 LAB — SLIDE CONSULT, PATHOLOGY ARMC

## 2019-11-26 LAB — PHOSPHORUS: Phosphorus: 3.2 mg/dL (ref 2.5–4.6)

## 2019-11-26 LAB — MAGNESIUM: Magnesium: 1.8 mg/dL (ref 1.7–2.4)

## 2019-11-26 MED ORDER — SODIUM CHLORIDE 0.9 % IV SOLN
Freq: Once | INTRAVENOUS | Status: AC
  Filled 2019-11-26: qty 250

## 2019-11-26 MED ORDER — SODIUM CHLORIDE 0.9% FLUSH
10.0000 mL | INTRAVENOUS | Status: DC | PRN
  Administered 2019-11-26: 10 mL via INTRAVENOUS
  Filled 2019-11-26: qty 10

## 2019-11-26 MED ORDER — HEPARIN SOD (PORK) LOCK FLUSH 100 UNIT/ML IV SOLN
500.0000 [IU] | Freq: Once | INTRAVENOUS | Status: AC
  Administered 2019-11-26: 500 [IU] via INTRAVENOUS
  Filled 2019-11-26: qty 5

## 2019-11-26 NOTE — Telephone Encounter (Signed)
Wife called reporting that patient is unable to eat and is now vomiting and would like for patient to come in to get IV fluids today Please call her back with a time. [phone]

## 2019-11-26 NOTE — Telephone Encounter (Signed)
Patient was currently at the [facility], was able to speak with nurse Barbera Setters who was able to give the patient information regarding his surgery.   Patient has been advised of Pre-Admission date/time, COVID Testing date and Surgery date.  Surgery Date: 11/30/19 Preadmission Testing Date: 11/29/19 (phone 8a-1p) Covid Testing Date: 11/29/19 - patient advised to go to the [place] [facility] between 8a-1p   Patient has been made aware to call [phone], between 1-3:00pm the day before surgery, to find out what time to arrive for surgery.

## 2019-11-26 NOTE — Telephone Encounter (Addendum)
Patient to come in for IV fluids today and see md

## 2019-11-29 ENCOUNTER — Inpatient Hospital Stay: Payer: 59

## 2019-11-29 ENCOUNTER — Telehealth (INDEPENDENT_AMBULATORY_CARE_PROVIDER_SITE_OTHER): Payer: 59 | Admitting: Surgery

## 2019-11-29 ENCOUNTER — Other Ambulatory Visit: Payer: Self-pay

## 2019-11-29 ENCOUNTER — Other Ambulatory Visit
Admission: RE | Admit: 2019-11-29 | Discharge: 2019-11-29 | Disposition: A | Discharge status: Home / Self Care | Payer: 59 | Source: Ambulatory Visit | Attending: Surgery | Admitting: Surgery

## 2019-11-29 VITALS — BP 115/72 | HR 63 | Temp 98.5°F | Resp 18

## 2019-11-29 DIAGNOSIS — C159 Malignant neoplasm of esophagus, unspecified: Secondary | ICD-10-CM

## 2019-11-29 DIAGNOSIS — Z01812 Encounter for preprocedural laboratory examination: Secondary | ICD-10-CM | POA: Insufficient documentation

## 2019-11-29 DIAGNOSIS — E860 Dehydration: Secondary | ICD-10-CM

## 2019-11-29 DIAGNOSIS — Z20822 Contact with and (suspected) exposure to covid-19: Secondary | ICD-10-CM | POA: Insufficient documentation

## 2019-11-29 DIAGNOSIS — Z5112 Encounter for antineoplastic immunotherapy: Secondary | ICD-10-CM | POA: Diagnosis not present

## 2019-11-29 MED ORDER — SODIUM CHLORIDE 0.9 % IV SOLN
Freq: Once | INTRAVENOUS | Status: AC
  Filled 2019-11-29: qty 250

## 2019-11-29 MED ORDER — SODIUM CHLORIDE 0.9% FLUSH
10.0000 mL | INTRAVENOUS | Status: DC | PRN
  Administered 2019-11-29: 10 mL via INTRAVENOUS
  Filled 2019-11-29: qty 10

## 2019-11-29 MED ORDER — HEPARIN SOD (PORK) LOCK FLUSH 100 UNIT/ML IV SOLN
500.0000 [IU] | Freq: Once | INTRAVENOUS | Status: AC
  Administered 2019-11-29: 500 [IU] via INTRAVENOUS
  Filled 2019-11-29: qty 5

## 2019-11-29 NOTE — Progress Notes (Signed)
Nutrition Assessment:  Acute add on for dysphagia, weight loss, decreased appetite, esophageal cancer.   59 year old male with stage IV esophageal cancer.  Past medical history of GERD, HTN.  Patient receiving chemotherapy.  Met with patient during IV fluid infusion.  Patient reports severely decrease appetite for the last 1 1/2-2 months unable to take solid foods.  For the last few days able to drink 1-2 ensures per day (low calorie, thinner shakes), jello, water.    MD recommending feeding tube placement.  Planning robiotic G-tube placement tomorrow.    Medications: zyprexa, zofran, protonix, carafate  Labs: BUN 22, creatinine 1.29, glucose 123, Na 133, Mag 1.8, phosphorus 3.2  Anthropometrics:   Weight 267 lb on 10/15 Ht: 77 inches BMI 31 UBW: 300 lb Noted 323 09/03/19  17% weight loss in the last 3 months, signficant   Estimated Energy Needs  Kcals: 2600-3000 Protein: 130-150 g Fluid: > 2.6 L  NUTRITION DIAGNOSIS: Inadequate oral intake related to esophageal cancer as evidenced by 17% weight loss in the last 3 months and eating < 50% energy needs for > or equal to 5 days.   INTERVENTION:  Recommend [employer] 1.4 standard formula 7 cartons per day.  Flush with 53m of water before and after each feeding.  Additional water flush of 2453mTID between feedings will be needed to improve hydration (can drink or give via tube) Tube feeding will provide 3185 calories, 140 g protein and 285971mree water (includes free water from formula). Recommend starting patient with 1/2 carton of feeding QID and will increase feeding by 1/2 carton DAILY until reach 7 full cartons per day.    Educated pt how to administer feeding through sample feeding tube.  Handwritten instructions provided Patient had no preference of DME, [employer] representative contacted to provided tube feeding formula and bolus supplies.   Contact information provided. Called wife at patient's request and discussed tube  feeding with her.     MONITORING, EVALUATION, GOAL: weight trends, intake, tube feeding tolerance   NEXT VISIT: October 21 phone f/u  Bette Brienza B. AllZenia ResidesD,[place] Dietitian [phone])

## 2019-11-29 NOTE — Telephone Encounter (Signed)
Pt was set up already for IVF today 1:30.

## 2019-11-29 NOTE — Patient Instructions (Addendum)
Your procedure is scheduled on: Tuesday November 30, 2019. Report to Day Surgery inside [facility] 2nd floor. To find out your arrival time please call [phone] between 1PM - 3PM on Monday November 29, 2019.  Remember: Instructions that are not followed completely may result in serious medical risk,  up to and including death, or upon the discretion of your surgeon and anesthesiologist your  surgery may need to be rescheduled.     _X__ 1. Do not eat food after midnight the night before your procedure.                 No chewing gum or hard candies. You may drink clear liquids up to 2 hours                 before you are scheduled to arrive for your surgery- DO not drink clear                 liquids within 2 hours of the start of your surgery.                 Clear Liquids include:  water, apple juice without pulp, clear Gatorade, G2 or                  Gatorade Zero (avoid Red/Purple/Blue), Black Coffee or Tea (Do not add                 anything to coffee or tea).  __X__2.  On the morning of surgery brush your teeth with toothpaste and water, you                may rinse your mouth with mouthwash if you wish.  Do not swallow any toothpaste of mouthwash.     _X__ 3.  No Alcohol for 24 hours before or after surgery.   _X__ 4.  Do Not Smoke or use e-cigarettes For 24 Hours Prior to Your Surgery.                 Do not use any chewable tobacco products for at least 6 hours prior to                 Surgery.  _X__  5.  Do not use any recreational drugs (marijuana, cocaine, heroin, ecstasy, MDMA or other)                For at least one week prior to your surgery.  Combination of these drugs with anesthesia                May have life threatening results.  __X_ 6.  Notify your doctor if there is any change in your medical condition      (cold, fever, infections).     Do not wear jewelry, make-up, hairpins, clips or nail polish. Do not wear lotions, powders,  or perfumes. You may wear deodorant. Do not shave 48 hours prior to surgery. Men may shave face and neck. Do not bring valuables to the hospital.    [facility] is not responsible for any belongings or valuables.  Contacts, dentures or bridgework may not be worn into surgery. Leave your suitcase in the car. After surgery it may be brought to your room. For patients admitted to the hospital, discharge time is determined by your treatment team.   Patients discharged the day of surgery will not be allowed to drive home.   Make arrangements for someone to be  with you for the first 24 hours of your Same Day Discharge.   __x__ Take these medicines the morning of surgery with A SIP OF WATER:    1. pantoprazole (PROTONIX) 40 MG     ____ Use CHG Soap (or wipes) as directed  ____ Use Benzoyl Peroxide Gel as instructed  ____ Use inhalers on the day of surgery  ____ Stop metformin 2 days prior to surgery    ____ Take 1/2 of usual insulin dose the night before surgery. No insulin the morning          of surgery.   ____ Stop Coumadin/Plavix/aspirin   __x__ Stop Anti-inflammatories such as Ibuprofen, Aleve, Advil, naproxen and or BC powders.    __x__ Stop supplements until after surgery.    __x__ Do not start any herbal supplements before your surgery.    If you have any questions regarding your pre-procedure instructions,  Please call Pre-admit Testing at [phone].

## 2019-11-29 NOTE — Progress Notes (Signed)
[facility] clinic to get 1 liter of NS prior to G tube placement tomorrow. Joli, nutritionist, is in to see patient for instructions/ education for Tube feedings.

## 2019-11-29 NOTE — Progress Notes (Addendum)
Referring provider:  DR. Janese Banks  Virtual Visit via Telemedicine Note  I connected with*Jacob Henson by phone conference ( PT HAS NO ACCESS TO VIDEO CHAT) his home on 11/29/19 at  9:00 AM EDT and verified that I was speaking with the correct person using their name and two idenfiers/date of birth.   I discussed the limitations, risks, security and privacy concerns of performing an evaluation and management service by telephonic/video telemedicine and the availability of in person appointments. I also discussed with the patient that there may be a patient responsible charge related to this service. The patient expressed understanding and agreed to proceed.  History of Present Illness: 59 y.o. Male is being evaluated for placement of robotic G tube. He was referred urgently by Dr. Janese Banks due to progressive dysphagia and need for enteral access. Patient reports nausea and vomiting for the last few weeks. HE endorses right shoulder pain, moderate and intermittent. I have personally reviewed CT scan showing distal esophageal mass c/w w CA. There is evidence of mets iliac crest and scapula. CBC and CMP nml except creat 1.86. EGD personally reviewed showing near obstructing mass distal esophagus. HE endorses malaise and significant weight loss. No prior abdominal surgeries.     Review of Systems:  Full ROS performed and is otherwise negative other than what is stated on HPI  Laboratory studies:     Assessment:  59 y.o. yo Male with a problem list including...  Patient Active Problem List   Diagnosis Date Noted  . Goals of care, counseling/discussion 11/15/2019  . 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 Mass of joint of right shoulder 10/28/2019  . Essential hypertension 10/28/2019  . GERD (gastroesophageal reflux disease) 10/28/2019      Plan:  Robotic G tube placement. D/W the pt in detail about the procedure, risks, benefits and possible complications including but not limited  to bleeding, bowel injuries, chronic pain, infection, malfunctioning requiring revision. He understands and wishes to proceed.               All of the above recommendations were discussed with the patient, and all of patient's questions were answered to his expressed satisfaction. A copy of this report was sent to the referring provider. The patient was advised to call back or seek an in-person evaluation if the symptoms worsen or if the condition fails to improve as anticipated. Provider location: Home  Phone call lasted aprx 8 minutes of  non-face-to-face time and I spent a total of 35 minutes in this encounter w greater than 50% spent in counseling and coordination of his care.  -- Caroleen Hamman, MD, FACS [place] Partnering for exceptional care. Office: [phone]

## 2019-11-30 ENCOUNTER — Encounter
Admission: RE | Disposition: A | Discharge status: Home / Self Care | Payer: Self-pay | Source: Home / Self Care | Attending: Surgery

## 2019-11-30 ENCOUNTER — Encounter: Payer: Self-pay | Admitting: Surgery

## 2019-11-30 ENCOUNTER — Other Ambulatory Visit: Payer: Self-pay

## 2019-11-30 ENCOUNTER — Telehealth: Payer: Self-pay

## 2019-11-30 ENCOUNTER — Ambulatory Visit: Payer: 59 | Admitting: Anesthesiology

## 2019-11-30 ENCOUNTER — Ambulatory Visit: Payer: 59

## 2019-11-30 ENCOUNTER — Ambulatory Visit
Admission: RE | Admit: 2019-11-30 | Discharge: 2019-11-30 | Disposition: A | Discharge status: Home / Self Care | Payer: 59 | Attending: Surgery | Admitting: Surgery

## 2019-11-30 DIAGNOSIS — C159 Malignant neoplasm of esophagus, unspecified: Secondary | ICD-10-CM | POA: Diagnosis not present

## 2019-11-30 DIAGNOSIS — I10 Essential (primary) hypertension: Secondary | ICD-10-CM | POA: Diagnosis not present

## 2019-11-30 DIAGNOSIS — Z87891 Personal history of nicotine dependence: Secondary | ICD-10-CM | POA: Diagnosis not present

## 2019-11-30 DIAGNOSIS — R1310 Dysphagia, unspecified: Secondary | ICD-10-CM | POA: Diagnosis not present

## 2019-11-30 DIAGNOSIS — Z515 Encounter for palliative care: Secondary | ICD-10-CM

## 2019-11-30 HISTORY — PX: PEG TUBE PLACEMENT: SUR1034

## 2019-11-30 LAB — SARS CORONAVIRUS 2 (TAT 6-24 HRS): SARS Coronavirus 2: NEGATIVE

## 2019-11-30 SURGERY — INSERTION, GASTROSTOMY TUBE, ROBOT-ASSISTED
Anesthesia: General

## 2019-11-30 MED ORDER — ONDANSETRON HCL 4 MG/2ML IJ SOLN
INTRAMUSCULAR | Status: AC
  Filled 2019-11-30: qty 2

## 2019-11-30 MED ORDER — MIDAZOLAM HCL 2 MG/2ML IJ SOLN
INTRAMUSCULAR | Status: AC
  Filled 2019-11-30: qty 2

## 2019-11-30 MED ORDER — SODIUM CHLORIDE 0.9 % IV SOLN
INTRAVENOUS | Status: AC
  Filled 2019-11-30: qty 2

## 2019-11-30 MED ORDER — SUGAMMADEX SODIUM 200 MG/2ML IV SOLN
INTRAVENOUS | Status: DC | PRN
  Administered 2019-11-30: 200 mg via INTRAVENOUS

## 2019-11-30 MED ORDER — OXYCODONE-ACETAMINOPHEN 5-325 MG PO TABS
1.0000 | ORAL_TABLET | ORAL | 0 refills | Status: DC | PRN
Start: 2019-11-30 — End: 2019-12-27

## 2019-11-30 MED ORDER — FENTANYL CITRATE (PF) 100 MCG/2ML IJ SOLN
INTRAMUSCULAR | Status: AC
  Filled 2019-11-30: qty 2

## 2019-11-30 MED ORDER — LIDOCAINE HCL (PF) 2 % IJ SOLN
INTRAMUSCULAR | Status: AC
  Filled 2019-11-30: qty 5

## 2019-11-30 MED ORDER — BUPIVACAINE-EPINEPHRINE 0.25% -1:200000 IJ SOLN
INTRAMUSCULAR | Status: DC | PRN
  Administered 2019-11-30: 30 mL

## 2019-11-30 MED ORDER — CHLORHEXIDINE GLUCONATE 0.12 % MT SOLN: 15.0000 mL | Freq: Once | OROMUCOSAL | Status: AC

## 2019-11-30 MED ORDER — SODIUM CHLORIDE 0.9 % IV SOLN
2.0000 g | INTRAVENOUS | Status: AC
  Administered 2019-11-30: 2 g via INTRAVENOUS

## 2019-11-30 MED ORDER — DEXAMETHASONE SODIUM PHOSPHATE 10 MG/ML IJ SOLN
INTRAMUSCULAR | Status: DC | PRN
  Administered 2019-11-30: 10 mg via INTRAVENOUS

## 2019-11-30 MED ORDER — PROPOFOL 10 MG/ML IV BOLUS
INTRAVENOUS | Status: AC
  Filled 2019-11-30: qty 20

## 2019-11-30 MED ORDER — BUPIVACAINE LIPOSOME 1.3 % IJ SUSP
INTRAMUSCULAR | Status: AC
  Filled 2019-11-30: qty 20

## 2019-11-30 MED ORDER — FENTANYL CITRATE (PF) 100 MCG/2ML IJ SOLN
INTRAMUSCULAR | Status: DC | PRN
Start: 2019-11-30 — End: 2019-11-30
  Administered 2019-11-30 (×4): 50 ug via INTRAVENOUS

## 2019-11-30 MED ORDER — CHLORHEXIDINE GLUCONATE CLOTH 2 % EX PADS
6.0000 | MEDICATED_PAD | Freq: Once | CUTANEOUS | Status: AC
  Administered 2019-11-30: 6 via TOPICAL

## 2019-11-30 MED ORDER — MIDAZOLAM HCL 2 MG/2ML IJ SOLN
INTRAMUSCULAR | Status: DC | PRN
  Administered 2019-11-30: 2 mg via INTRAVENOUS

## 2019-11-30 MED ORDER — CHLORHEXIDINE GLUCONATE CLOTH 2 % EX PADS: 6.0000 | MEDICATED_PAD | Freq: Once | CUTANEOUS | Status: DC

## 2019-11-30 MED ORDER — LACTATED RINGERS IV SOLN: INTRAVENOUS | Status: DC

## 2019-11-30 MED ORDER — ORAL CARE MOUTH RINSE: 15.0000 mL | Freq: Once | OROMUCOSAL | Status: AC

## 2019-11-30 MED ORDER — PHENYLEPHRINE HCL (PRESSORS) 10 MG/ML IV SOLN
INTRAVENOUS | Status: DC | PRN
  Administered 2019-11-30 (×3): 200 ug via INTRAVENOUS

## 2019-11-30 MED ORDER — FENTANYL CITRATE (PF) 100 MCG/2ML IJ SOLN
INTRAMUSCULAR | Status: AC
  Administered 2019-11-30: 25 ug via INTRAVENOUS
  Filled 2019-11-30: qty 2

## 2019-11-30 MED ORDER — SUCCINYLCHOLINE CHLORIDE 200 MG/10ML IV SOSY
PREFILLED_SYRINGE | INTRAVENOUS | Status: AC
  Filled 2019-11-30: qty 10

## 2019-11-30 MED ORDER — DEXAMETHASONE SODIUM PHOSPHATE 10 MG/ML IJ SOLN
INTRAMUSCULAR | Status: AC
  Filled 2019-11-30: qty 1

## 2019-11-30 MED ORDER — LIDOCAINE HCL (CARDIAC) PF 100 MG/5ML IV SOSY
PREFILLED_SYRINGE | INTRAVENOUS | Status: DC | PRN
  Administered 2019-11-30: 80 mg via INTRAVENOUS

## 2019-11-30 MED ORDER — ONDANSETRON HCL 4 MG/2ML IJ SOLN
INTRAMUSCULAR | Status: AC
  Administered 2019-11-30: 4 mg via INTRAVENOUS
  Filled 2019-11-30: qty 2

## 2019-11-30 MED ORDER — ROCURONIUM BROMIDE 10 MG/ML (PF) SYRINGE
PREFILLED_SYRINGE | INTRAVENOUS | Status: AC
  Filled 2019-11-30: qty 10

## 2019-11-30 MED ORDER — PROPOFOL 10 MG/ML IV BOLUS
INTRAVENOUS | Status: DC | PRN
  Administered 2019-11-30: 200 mg via INTRAVENOUS

## 2019-11-30 MED ORDER — CHLORHEXIDINE GLUCONATE 0.12 % MT SOLN
OROMUCOSAL | Status: AC
  Administered 2019-11-30: 15 mL via OROMUCOSAL
  Filled 2019-11-30: qty 15

## 2019-11-30 MED ORDER — SUCCINYLCHOLINE CHLORIDE 20 MG/ML IJ SOLN
INTRAMUSCULAR | Status: DC | PRN
  Administered 2019-11-30: 140 mg via INTRAVENOUS

## 2019-11-30 MED ORDER — ROCURONIUM BROMIDE 100 MG/10ML IV SOLN
INTRAVENOUS | Status: DC | PRN
  Administered 2019-11-30: 5 mg via INTRAVENOUS

## 2019-11-30 MED ORDER — ONDANSETRON HCL 4 MG/2ML IJ SOLN: 4.0000 mg | Freq: Once | INTRAMUSCULAR | Status: AC | PRN

## 2019-11-30 MED ORDER — BUPIVACAINE LIPOSOME 1.3 % IJ SUSP
INTRAMUSCULAR | Status: DC | PRN
  Administered 2019-11-30: 20 mL

## 2019-11-30 MED ORDER — FENTANYL CITRATE (PF) 100 MCG/2ML IJ SOLN
25.0000 ug | INTRAMUSCULAR | Status: DC | PRN
  Administered 2019-11-30 (×3): 25 ug via INTRAVENOUS

## 2019-11-30 MED ORDER — BUPIVACAINE-EPINEPHRINE (PF) 0.25% -1:200000 IJ SOLN
INTRAMUSCULAR | Status: AC
  Filled 2019-11-30: qty 30

## 2019-11-30 SURGICAL SUPPLY — 55 items
ADH SKN CLS APL DERMABOND .7 (GAUZE/BANDAGES/DRESSINGS) ×1
APL PRP STRL LF DISP 70% ISPRP (MISCELLANEOUS) ×1
CANISTER SUCT 1200ML W/VALVE (MISCELLANEOUS) IMPLANT
CHLORAPREP W/TINT 26 (MISCELLANEOUS) ×3 IMPLANT
COVER TIP SHEARS 8 DVNC (MISCELLANEOUS) IMPLANT
COVER TIP SHEARS 8MM DA VINCI (MISCELLANEOUS)
COVER WAND RF STERILE (DRAPES) ×6 IMPLANT
DEFOGGER SCOPE WARMER CLEARIFY (MISCELLANEOUS) ×3 IMPLANT
DERMABOND ADVANCED (GAUZE/BANDAGES/DRESSINGS) ×2
DERMABOND ADVANCED .7 DNX12 (GAUZE/BANDAGES/DRESSINGS) ×1 IMPLANT
DRAPE ARM DVNC X/XI (DISPOSABLE) ×3 IMPLANT
DRAPE COLUMN DVNC XI (DISPOSABLE) ×1 IMPLANT
DRAPE DA VINCI XI ARM (DISPOSABLE) ×6
DRAPE DA VINCI XI COLUMN (DISPOSABLE) ×2
ELECT CAUTERY BLADE 6.4 (BLADE) ×3 IMPLANT
ELECT REM PT RETURN 9FT ADLT (ELECTROSURGICAL) ×3
ELECTRODE REM PT RTRN 9FT ADLT (ELECTROSURGICAL) ×1 IMPLANT
GAUZE SPONGE 4X4 12PLY STRL (GAUZE/BANDAGES/DRESSINGS) ×3 IMPLANT
GLOVE BIO SURGEON STRL SZ7 (GLOVE) ×6 IMPLANT
GOWN STRL REUS W/ TWL LRG LVL3 (GOWN DISPOSABLE) ×3 IMPLANT
GOWN STRL REUS W/TWL LRG LVL3 (GOWN DISPOSABLE) ×9
IRRIGATION STRYKERFLOW (MISCELLANEOUS) IMPLANT
IRRIGATOR STRYKERFLOW (MISCELLANEOUS)
KIT PINK PAD W/HEAD ARE REST (MISCELLANEOUS) ×3
KIT PINK PAD W/HEAD ARM REST (MISCELLANEOUS) ×1 IMPLANT
LABEL OR SOLS (LABEL) ×3 IMPLANT
NEEDLE HYPO 22GX1.5 SAFETY (NEEDLE) ×3 IMPLANT
NEEDLE INSUFFLATION 14GA 120MM (NEEDLE) ×3 IMPLANT
OBTURATOR OPTICAL STANDARD 8MM (TROCAR) ×2
OBTURATOR OPTICAL STND 8 DVNC (TROCAR) ×1
OBTURATOR OPTICALSTD 8 DVNC (TROCAR) ×1 IMPLANT
PACK LAP CHOLECYSTECTOMY (MISCELLANEOUS) ×3 IMPLANT
PENCIL ELECTRO HAND CTR (MISCELLANEOUS) ×3 IMPLANT
SEAL CANN UNIV 5-8 DVNC XI (MISCELLANEOUS) ×3 IMPLANT
SEAL XI 5MM-8MM UNIVERSAL (MISCELLANEOUS) ×6
SEALER VESSEL DA VINCI XI (MISCELLANEOUS)
SEALER VESSEL EXT DVNC XI (MISCELLANEOUS) IMPLANT
SET TUBE SMOKE EVAC HIGH FLOW (TUBING) ×3 IMPLANT
SOLUTION ELECTROLUBE (MISCELLANEOUS) ×3 IMPLANT
SPONGE LAP 4X18 RFD (DISPOSABLE) ×3 IMPLANT
SUT ETHILON 3-0 FS-10 30 BLK (SUTURE) ×3
SUT MNCRL 4-0 (SUTURE) ×3
SUT MNCRL 4-0 27XMFL (SUTURE) ×1
SUT V-LOC 90 ABS 3-0 VLT  V-20 (SUTURE) ×2
SUT V-LOC 90 ABS 3-0 VLT V-20 (SUTURE) ×1 IMPLANT
SUT VLOC 90 S/L VL9 GS22 (SUTURE) ×3 IMPLANT
SUTURE EHLN 3-0 FS-10 30 BLK (SUTURE) ×1 IMPLANT
SUTURE MNCRL 4-0 27XMF (SUTURE) ×1 IMPLANT
SYR 30ML LL (SYRINGE) ×3 IMPLANT
SYR TOOMEY IRRIG 70ML (MISCELLANEOUS) ×3
SYRINGE TOOMEY IRRIG 70ML (MISCELLANEOUS) ×1 IMPLANT
TUBE GASTRO 14F 5C (TUBING) IMPLANT
TUBE JEJUNO 16X14 (TUBING) IMPLANT
TUBE JEJUNO 18X14 (TUBING) ×3 IMPLANT
TUBE JEJUNO 22X14 (TUBING) IMPLANT

## 2019-11-30 NOTE — Anesthesia Procedure Notes (Signed)
Procedure Name: Intubation Performed by: Philbert Riser, CRNA Pre-anesthesia Checklist: Patient identified, Emergency Drugs available, Suction available, Patient being monitored and Timeout performed Patient Re-evaluated:Patient Re-evaluated prior to induction Oxygen Delivery Method: Circle system utilized and Simple face mask Preoxygenation: Pre-oxygenation with 100% oxygen Induction Type: IV induction Ventilation: Mask ventilation without difficulty and Oral airway inserted - appropriate to patient size Laryngoscope Size: McGraph and 3 Grade View: Grade II Tube type: Oral Tube size: 7.5 mm Number of attempts: 1 Airway Equipment and Method: Stylet Placement Confirmation: ETT inserted through vocal cords under direct vision,  positive ETCO2 and breath sounds checked- equal and bilateral Secured at: 22 cm Tube secured with: Tape

## 2019-11-30 NOTE — Telephone Encounter (Signed)
Nutrition  Received message from [facility] that they have not been able to reach patient or wife today and have left messages to set up delivery time.    RD tried to call several times this afternoon and has been unable to reach patient.  Left message on home number.    Fianna Snowball B. Zenia Resides, [place], [place] Registered Dietitian [phone] (mobile)

## 2019-11-30 NOTE — H&P (Signed)
Patient ID: Jacob Henson, male   DOB: 07-Jun-1960, 59 y.o.   MRN: [id number]  HPI Jacob Henson is a 59 y.o. male 59 y.o. Male is being evaluated for placement of robotic G tube. He was referred urgently by Dr. Janese Banks due to progressive dysphagia and need for enteral access. Patient reports nausea and vomiting for the last few weeks. HE endorses right shoulder pain, moderate and intermittent. I have personally reviewed CT scan showing distal esophageal mass c/w w CA. There is evidence of mets iliac crest and scapula. CBC and CMP nml except creat 1.86. EGD personally reviewed showing near obstructing mass distal esophagus. HE endorses malaise and significant weight loss. No prior abdominal surgeries.   HPI  Past Medical History:  Diagnosis Date  . Allergy   . Cancer ([place])   . GERD (gastroesophageal reflux disease)    Has required esophageal dilation in past  . Hypertension     Past Surgical History:  Procedure Laterality Date  . ESOPHAGEAL DILATION    . PORTA CATH INSERTION N/A 11/19/2019   Procedure: PORTA CATH INSERTION;  Surgeon: Algernon Huxley, MD;  Location: [facility];  Service: Cardiovascular;  Laterality: N/A;    Family History  Problem Relation Age of Onset  . Heart disease Father   . Hearing loss Father   . Diabetes Father   . Heart disease Paternal Grandmother   . Diabetes Paternal Grandmother   . Heart attack Paternal Grandfather     Social History Social History   Tobacco Use  . Smoking status: Former Smoker    Packs/day: 1.00    Years: 4.00    Pack years: 4.00    Types: Cigarettes    Quit date: 10/14/1978    Years since quitting: 41.1  . Smokeless tobacco: Never Used  Vaping Use  . Vaping Use: Never used  Substance Use Topics  . Alcohol use: No  . Drug use: Never    No Known Allergies  Current Facility-Administered Medications  Medication Dose Route Frequency Provider Last Rate Last Admin  . cefoTEtan (CEFOTAN) 2 g in sodium  chloride 0.9 % 100 mL IVPB  2 g Intravenous On Call to Ripon, Stanton, MD      . Chlorhexidine Gluconate Cloth 2 % PADS 6 each  6 each Topical Once Narelle Schoening, [place] F, MD      . lactated ringers infusion   Intravenous Continuous Penwarden, Amy, MD 10 mL/hr at 11/30/19 0829 New Bag at 11/30/19 0829  . sodium chloride 0.9 % with cefoTEtan (CEFOTAN) ADS Med              Review of Systems Full ROS  was asked and was negative except for the information on the HPI  Physical Exam Blood pressure 106/85, pulse 76, temperature (!) 96.3 F (35.7 C), temperature source Temporal, resp. rate 18, SpO2 99 %. CONSTITUTIONAL:NAD EYES: Pupils are equal, round,  Sclera are non-icteric. EARS, NOSE, MOUTH AND THROAT:Wearing a mask. Hearing is intact to voice. LYMPH NODES:  Lymph nodes in the neck are normal. RESPIRATORY:  Lungs are clear. There is normal respiratory effort, with equal breath sounds bilaterally, and without pathologic use of accessory muscles. CARDIOVASCULAR: Heart is regular without murmurs, gallops, or rubs. GI: The abdomen is soft, nontender, and nondistended. There are no palpable masses. There is no hepatosplenomegaly. There are normal bowel sounds in all quadrants. GU: Rectal deferred.   SKIN: Turgor is good and there are no pathologic skin lesions or  ulcers. NEUROLOGIC: Motor and sensation is grossly normal. Cranial nerves are grossly intact. PSYCH:  Oriented to person, place and time. Affect is normal.  Data Reviewed  I have personally reviewed the patient's imaging, laboratory findings and medical records.    Assessment/Plan Pt w metastatic esophageal cancer in need for  Robotic G tube placement. D/W the pt in detail about the procedure, risks, benefits and possible complications including but not limited to bleeding, bowel injuries, chronic pain, infection, malfunctioning requiring revision. He understands and wishes to proceed.   Caroleen Hamman, MD FACS General  Surgeon 11/30/2019, 8:50 AM

## 2019-11-30 NOTE — Op Note (Signed)
Robotic assisted laparoscopic gastrostomy tube   Pre-operative Diagnosis: Esophageal cancer   Post-operative Diagnosis: same   Procedure:  Robotic assisted laparoscopic G tube # 18 FR   Surgeon: Caroleen Hamman, MD FACS   Anesthesia: Gen. with endotracheal tube   Findings: G tube body of the stomach, no leak, no bowel injuries   Estimated Blood Loss:5 cc             Complications: none     Procedure Details  The patient was seen again in the Holding Room. The benefits, complications, treatment options, and expected outcomes were discussed with the Family. The risks of bleeding, infection, recurrence of symptoms, failure to resolve symptoms, bowel injury, any of which could require further surgery were reviewed .   The  family concurred with the proposed plan, giving informed consent.  The patient was taken to Operating Room, identified  and the procedure verified. A Time Out was held and the above information confirmed.   Prior to the induction of general anesthesia, antibiotic prophylaxis was administered. VTE prophylaxis was in place. General endotracheal anesthesia was then administered and tolerated well. After the induction, the abdomen was prepped with Chloraprep and draped in the sterile fashion. The patient was positioned in the supine position.   Veres needle approach was used on [employer] point. Pneumoperitoneum was then created with CO2 and tolerated well without any adverse changes in the patient's vital signs.  Three 8-mm ports were placed under direct vision. I used optiview technique for the first one identified anterior and posterior layers and peritoneum. Inspection revealed small stomach, no evidence of bowel injuries.. I grasped the body  stomach and tented it  toward the abdominal wall, small left subcostal incision created to placed Gastrostomy tube under direct visualization.     The patient was positioned  in reverse Trendelenburg, robot was brought to the surgical  field and docked in the standard fashion.  We made sure all the instrumentation was kept indirect view at all times and that there were no collision between the arms. I scrubbed out and went to the console. Using the scissors I performed a gastrotomy and confirmed that I was intraluminally. A partial pursestring suture was placed using 3 0 V-Loc suture around the G-tube in an inner fashion. I placed the G-tube through the gastrotomy in direct fashion.  I was able to get my scrub to flush saline via the G-tube confirming the proper position of the tube intraluminally.  The balloon was inflated and pulled back.  We then were able to tied the first pursestring in the standard fashion and completing our circumferential outer pursestring with the 2-0 V-Loc sutures Inspection of the  upper quadrant was performed. No bleeding, bile duct injury or leak, or bowel injury was noted.  All the needles were removed under direct visualization. Robotic instruments and robotic arms were undocked in the standard fashion.  I scrubbed back in. Liposomal Marcaine was used to infiltrate the abdominal wall at all incision sites   Pneumoperitoneum was released.  4-0 subcuticular Monocryl was used to close the skin. Dermabond was  applied.  The patient was then extubated and brought to the recovery room in stable condition. Sponge, lap, and needle counts were correct at closure and at the conclusion of the case.               Caroleen Hamman, MD, FACS

## 2019-11-30 NOTE — Transfer of Care (Signed)
Immediate Anesthesia Transfer of Care Note  Patient: Jacob Henson  Procedure(s) Performed: XI ROBOT ASSISTED GASTROSTOMY TUBE PLACEMENT (N/A )  Patient Location: PACU  Anesthesia Type:General  Level of Consciousness: awake, alert  and oriented  Airway & Oxygen Therapy: Patient Spontanous Breathing and Patient connected to face mask oxygen  Post-op Assessment: Report given to RN and Post -op Vital signs reviewed and stable  Post vital signs: Reviewed and stable  Last Vitals:  Vitals Value Taken Time  BP 153/95 11/30/19 1116  Temp    Pulse 117 11/30/19 1119  Resp 20 11/30/19 1119  SpO2 98 % 11/30/19 1119  Vitals shown include unvalidated device data.  Last Pain:  Vitals:   11/30/19 0817  TempSrc: Temporal  PainSc: 4          Complications: No complications documented.

## 2019-11-30 NOTE — Anesthesia Preprocedure Evaluation (Signed)
Anesthesia Evaluation  Patient identified by MRN, date of birth, ID band Patient awake    Reviewed: Allergy & Precautions, H&P , NPO status , Patient's Chart, lab work & pertinent test results, reviewed documented beta blocker date and time   Airway Mallampati: II  TM Distance: >3 FB Neck ROM: full  Mouth opening: Limited Mouth Opening  Dental  (+) Teeth Intact, Poor Dentition, Chipped, Missing   Pulmonary neg pulmonary ROS, former smoker,    Pulmonary exam normal        Cardiovascular Exercise Tolerance: Poor hypertension, On Medications negative cardio ROS Normal cardiovascular exam Rhythm:regular Rate:Normal     Neuro/Psych negative neurological ROS  negative psych ROS   GI/Hepatic Neg liver ROS, GERD  Medicated,  Endo/Other  negative endocrine ROS  Renal/GU negative Renal ROS  negative genitourinary   Musculoskeletal   Abdominal   Peds  Hematology negative hematology ROS (+)   Anesthesia Other Findings Past Medical History: No date: Allergy No date: Cancer ([place]) No date: GERD (gastroesophageal reflux disease)     Comment:  Has required esophageal dilation in past No date: Hypertension Past Surgical History: No date: ESOPHAGEAL DILATION 11/19/2019: PORTA CATH INSERTION; N/A     Comment:  Procedure: PORTA CATH INSERTION;  Surgeon: Algernon Huxley,              MD;  Location: [facility];  Service:               Cardiovascular;  Laterality: N/A;   Reproductive/Obstetrics negative OB ROS                             Anesthesia Physical Anesthesia Plan  ASA: III  Anesthesia Plan: General ETT   Post-op Pain Management:    Induction:   PONV Risk Score and Plan:   Airway Management Planned:   Additional Equipment:   Intra-op Plan:   Post-operative Plan:   Informed Consent: I have reviewed the patients History and Physical, chart, labs and discussed the procedure  including the risks, benefits and alternatives for the proposed anesthesia with the patient or authorized representative who has indicated his/her understanding and acceptance.     Dental Advisory Given  Plan Discussed with: CRNA  Anesthesia Plan Comments:         Anesthesia Quick Evaluation

## 2019-11-30 NOTE — Discharge Instructions (Addendum)

## 2019-12-01 ENCOUNTER — Other Ambulatory Visit: Payer: Self-pay | Admitting: *Deleted

## 2019-12-01 ENCOUNTER — Ambulatory Visit: Payer: 59

## 2019-12-01 NOTE — Anesthesia Postprocedure Evaluation (Signed)
Anesthesia Post Note  Patient: Jacob Henson  Procedure(s) Performed: XI ROBOT ASSISTED GASTROSTOMY TUBE PLACEMENT (N/A )  Patient location during evaluation: PACU Anesthesia Type: General Level of consciousness: awake and alert Pain management: pain level controlled Vital Signs Assessment: post-procedure vital signs reviewed and stable Respiratory status: spontaneous breathing, nonlabored ventilation, respiratory function stable and patient connected to nasal cannula oxygen Cardiovascular status: blood pressure returned to baseline and stable Postop Assessment: no apparent nausea or vomiting Anesthetic complications: no   No complications documented.   Last Vitals:  Vitals:   11/30/19 1223 11/30/19 1235  BP:  (!) 154/64  Pulse: 99 62  Resp: 12 18  Temp:    SpO2: 100% 95%    Last Pain:  Vitals:   12/01/19 0903  TempSrc:   PainSc: 0-No pain                 Molli Barrows

## 2019-12-01 NOTE — Progress Notes (Signed)
Hematology/Oncology Consult note [facility]  [phone] [phone]  Patient Care Team: Elby Beck, FNP as PCP - General (Nurse Practitioner) Clent Jacks, RN as Oncology Nurse Navigator Sindy Guadeloupe, MD as Consulting Physician (Oncology)   Name of the patient: Jacob Henson  [id number]  1960/03/20   Date of visit: 12/01/19  Diagnosis- metastatic esophageal adenocarcinoma with bone metastases  Chief complaint/ Reason for visit-acute visit for ongoing poor oral intake and fatigue  Heme/Onc history: Patient is a 59 year old male who was admitted to [facility] with symptoms of nausea vomiting poor appetite as well as worsening right scapular pain he underwent CT chest abdomen pelvis with contrast on 11/09/2019 which showed mural thickening of the lower one third of the esophagus consistent with esophageal malignancy. Soft tissue lesions in the right scapula and left iliac crest consistent with osseous metastatic disease. Soft tissue nodules at the celiac trunk and the GE junction concerning for metastatic lymphadenopathy. Nonspecific left upper lobe pulmonary nodule and right paracolic peritoneal nodule. He underwent FNA of the right scapular lytic lesion which was consistent with adenocarcinoma. Malignant cells positive for CK7 and CK20 and negative for TTF-1 and PAX 3 and CDX2 was negative. Differential diagnosis includes pancreaticobiliary and GI origin.CEA elevated at 135. CBC was unremarkable. 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  Patient will also be seeing radiation oncology   Interval history-patient reports difficulty swallowing and he is not able to eat any solid food.  He is not sure of nausea is making it worse.  He has lost 3 pounds in the last 3 days  ECOG PS- 1 Pain scale- 3 Opioid associated constipation- no  Review of systems- Review of Systems  Constitutional: Positive for malaise/fatigue. Negative for chills, fever and weight loss.  HENT: Negative for congestion, ear discharge and nosebleeds.   Eyes: Negative for blurred vision.  Respiratory: Negative for cough, hemoptysis, sputum production, shortness of breath and wheezing.   Cardiovascular: Negative for chest pain, palpitations, orthopnea and claudication.  Gastrointestinal: Negative for abdominal pain, blood in stool, constipation, diarrhea, heartburn, melena, nausea and vomiting.  Genitourinary: Negative for dysuria, flank pain, frequency, hematuria and urgency.  Musculoskeletal: Negative for back pain, joint pain and myalgias.  Skin: Negative for rash.  Neurological: Negative for dizziness, tingling, focal weakness, seizures, weakness and headaches.  Endo/Heme/Allergies: Does not bruise/bleed easily.  Psychiatric/Behavioral: Negative for depression and suicidal ideas. The patient does not have insomnia.       No Known Allergies   Past Medical History:  Diagnosis Date   Allergy    Cancer ([place])    GERD (gastroesophageal reflux disease)    Has required esophageal dilation in past   Hypertension      Past Surgical History:  Procedure Laterality Date   ESOPHAGEAL DILATION     PORTA CATH INSERTION N/A 11/19/2019   Procedure: PORTA CATH INSERTION;  Surgeon: Algernon Huxley, MD;  Location: [facility];  Service: Cardiovascular;  Laterality: N/A;    Social History   Socioeconomic History   Marital status: Married    Spouse name: Not on file   Number  of children: Not on file    Years of education: Not on file   Highest education level: Not on file  Occupational History   Occupation: full time  Tobacco Use   Smoking status: Former Smoker    Packs/day: 1.00    Years: 4.00    Pack years: 4.00    Types: Cigarettes    Quit date: 10/14/1978    Years since quitting: 41.1   Smokeless tobacco: Never Used  Vaping Use   Vaping Use: Never used  Substance and Sexual Activity   Alcohol use: No   Drug use: Never   Sexual activity: Not on file  Other Topics Concern   Not on file  Social History Narrative   Not on file   Social Determinants of Health   Financial Resource Strain:    Difficulty of Paying Living Expenses: Not on file  Food Insecurity:    Worried About Charity fundraiser in the Last Year: Not on file   [employer] of Food in the Last Year: Not on file  Transportation Needs:    Lack of Transportation (Medical): Not on file   Lack of Transportation (Non-Medical): Not on file  Physical Activity:    Days of Exercise per Week: Not on file   Minutes of Exercise per Session: Not on file  Stress:    Feeling of Stress : Not on file  Social Connections:    Frequency of Communication with Friends and Family: Not on file   Frequency of Social Gatherings with Friends and Family: Not on file   Attends Religious Services: Not on file   Active Member of Clubs or Organizations: Not on file   Attends Archivist Meetings: Not on file   Marital Status: Not on file  Intimate Partner Violence:    Fear of Current or Ex-Partner: Not on file   Emotionally Abused: Not on file   Physically Abused: Not on file   Sexually Abused: Not on file    Family History  Problem Relation Age of Onset   Heart disease Father    Hearing loss Father    Diabetes Father    Heart disease Paternal Grandmother    Diabetes Paternal Grandmother    Heart attack Paternal Grandfather      Current Outpatient Medications:    fluticasone  (FLONASE) 50 MCG/ACT nasal spray, Place 2 sprays into both nostrils daily. (Patient taking differently: Place 2 sprays into both nostrils 2 (two) times daily. ), Disp: 16 g, Rfl: 6   levocetirizine (XYZAL) 5 MG tablet, Take 5 mg by mouth daily. , Disp: , Rfl:    lidocaine-prilocaine (EMLA) cream, Apply to affected area once (Patient taking differently: Apply 1 application topically once. Apply to affected area once), Disp: 30 g, Rfl: 3   lisinopril (ZESTRIL) 20 MG tablet, Take 1 tablet (20 mg total) by mouth daily., Disp: 90 tablet, Rfl: 3   OLANZapine (ZYPREXA) 10 MG tablet, Take 1 tablet (10 mg total) by mouth at bedtime., Disp: 30 tablet, Rfl: 3   ondansetron (ZOFRAN-ODT) 8 MG disintegrating tablet, Take 1 tablet (8 mg total) by mouth every 8 (eight) hours as needed for nausea or vomiting., Disp: 30 tablet, Rfl: 1   oxyCODONE (OXY IR/ROXICODONE) 5 MG immediate release tablet, Take 1 tablet (5 mg total) by mouth every 4 (four) hours as needed for breakthrough pain., Disp: 120 tablet, Rfl: 0   oxyCODONE (OXYCONTIN) 10 mg 12 hr tablet, Take 1 tablet (10 mg total) by  mouth every 12 (twelve) hours., Disp: 60 tablet, Rfl: 0   pantoprazole (PROTONIX) 40 MG tablet, Take 40 mg by mouth 2 (two) times daily. , Disp: , Rfl:    prochlorperazine (COMPAZINE) 10 MG tablet, Take 1 tablet (10 mg total) by mouth every 6 (six) hours as needed for nausea or vomiting., Disp: 30 tablet, Rfl: 2   sucralfate (CARAFATE) 1 GM/10ML suspension, Take 1 g by mouth 4 (four) times daily as needed (stomach pain). , Disp: , Rfl:    oxyCODONE-acetaminophen (PERCOCET) 5-325 MG tablet, Take 1-2 tablets by mouth every 4 (four) hours as needed for severe pain., Disp: 20 tablet, Rfl: 0   simethicone (MYLICON) 151 MG chewable tablet, Chew 125 mg by mouth every 6 (six) hours as needed for flatulence., Disp: , Rfl:   Physical exam:  Vitals:   11/26/19 1141  BP: 116/61  Pulse: (!) 130  Resp: 16  Temp: (!) 96.5 F (35.8 C)    TempSrc: Tympanic  SpO2: 98%  Weight: 267 lb 4.8 oz (121.2 kg)   Physical Exam Cardiovascular:     Rate and Rhythm: Regular rhythm. Tachycardia present.     Heart sounds: Normal heart sounds.  Pulmonary:     Effort: Pulmonary effort is normal.     Breath sounds: Normal breath sounds.  Abdominal:     General: Bowel sounds are normal.     Palpations: Abdomen is soft.  Skin:    General: Skin is warm and dry.  Neurological:     Mental Status: He is alert and oriented to person, place, and time.      CMP Latest Ref Rng & Units 11/26/2019  Glucose 70 - 99 mg/dL 123(H)  BUN 6 - 20 mg/dL 22(H)  Creatinine 0.61 - 1.24 mg/dL 1.29(H)  Sodium 135 - 145 mmol/L 133(L)  Potassium 3.5 - 5.1 mmol/L 3.9  Chloride 98 - 111 mmol/L 94(L)  CO2 22 - 32 mmol/L 26  Calcium 8.9 - 10.3 mg/dL 9.5  Total Protein 6.5 - 8.1 g/dL 7.3  Total Bilirubin 0.3 - 1.2 mg/dL 1.4(H)  Alkaline Phos 38 - 126 U/L 72  AST 15 - 41 U/L 21  ALT 0 - 44 U/L 20   CBC Latest Ref Rng & Units 11/22/2019  WBC 4.0 - 10.5 K/uL 10.5  Hemoglobin 13.0 - 17.0 g/dL 13.2  Hematocrit 39 - 52 % 38.9(L)  Platelets 150 - 400 K/uL 244    No images are attached to the encounter.  PERIPHERAL VASCULAR CATHETERIZATION  Result Date: 11/19/2019 See op note  NM PET Image Initial (PI) Skull Base To Thigh  Result Date: 11/23/2019 CLINICAL DATA:  Initial treatment strategy for metastatic esophageal adenocarcinoma. Known right scapular metastasis. EXAM: NUCLEAR MEDICINE PET SKULL BASE TO THIGH TECHNIQUE: 15.4 mCi F-18 FDG was injected intravenously. Full-ring PET imaging was performed from the skull base to thigh after the radiotracer. CT data was obtained and used for attenuation correction and anatomic localization. Fasting blood glucose: 121 mg/dl COMPARISON:  CT abdomen/pelvis dated 10/21/2019. MRI right shoulder dated 10/16/2019. FINDINGS: Mediastinal blood pool activity: SUV max 3.7 Liver activity: SUV max NA NECK: No hypermetabolic  cervical lymphadenopathy. Incidental CT findings: none CHEST: Mass involving the lower 3rd of the esophagus (series 3/image 123), max SUV 15.0, corresponding to the patient's known primary esophageal neoplasm. 10 mm anterior left upper lobe nodule (series 3/image 85), max SUV 4.0, compatible with pulmonary metastasis. No hypermetabolic thoracic lymphadenopathy. Right chest port terminates in the lower SVC. Incidental CT findings:  Mild atherosclerotic calcifications of the aortic arch. ABDOMEN/PELVIS: 3.0 cm right adrenal metastasis (series 3/image 152), max SUV 9.0. 8 mm short axis celiac axis node (series 3/image 149), max SUV 6.4. Incidental CT findings: Layering gallbladder sludge versus noncalcified gallstones. Mild atherosclerotic calcifications of the abdominal aorta and branch vessels. SKELETON: Widespread osseous metastases throughout the visualized axial and appendicular skeleton, including: --Destructive right scapular mass, max SUV 7.7 --Right sternum, max SUV 7.4 --Right 5th costosternal junction, max SUV 7.1 --T12 vertebral body, max SUV 12.4 --Destructive left iliac bone mass, max SUV 11.4 --Right proximal femoral shaft, max SUV 5.4 Incidental CT findings: Degenerative changes of the visualized thoracolumbar spine. IMPRESSION: Distal esophageal mass, corresponding to the patient's known primary esophageal neoplasm. 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 Widespread osseous metastases, including destructive lesions in the right scapula and left iliac bone, as above. Electronically Signed   By: Julian Hy M.D.   On: 11/23/2019 11:43     Assessment and plan- Patient is a 59 y.o. male with metastatic esophageal adenocarcinoma with bone metastases.  This is an acute visit for ongoing weight loss and poor oral intake  Patient is having increasing difficulty swallowing and his oral intake has been poor.  Although he will be starting with  palliative radiation to his esophagus soon, it will likely take several weeks for his dysphagia to get better.  I therefore discussed inserting palliative PEG tube at this time to improve his nutritional status.  Patient is agreeable for the same.  I did get in touch with Dr. Dahlia Byes who will be putting in a feeding tube on 11/30/2019.  I have also alerted our nutritionist Herb Grays to take care of the tube feeds post PEG tube insertion  We will plan to give him 2 L of IV fluids today as well as 1 L again on 11/29/2019   Visit Diagnosis 1. Dehydration   2. Tachycardia   3. Abnormal weight loss   4. Esophageal dysphagia      Dr. Randa Evens, MD, MPH [facility] at [facility] [phone] 12/01/2019 12:00 PM

## 2019-12-01 NOTE — Addendum Note (Signed)
Addendum  created 12/01/19 1537 by Philbert Riser, CRNA   Attestation recorded in [place], [place] accepted, [employer] filed

## 2019-12-01 NOTE — Telephone Encounter (Signed)
I have called the wife and spoke to her on how we can help. We changed chemo date to Tuesday. We changed the simulation appt to wed 1 pm. His pump d/c will be Thursday 1 pm. She already knew about fluids this week on Friday. Worked out transportation for [employer] of next week and the other days she can bring him

## 2019-12-02 ENCOUNTER — Ambulatory Visit: Payer: 59

## 2019-12-02 ENCOUNTER — Other Ambulatory Visit: Payer: Self-pay

## 2019-12-02 DIAGNOSIS — Z789 Other specified health status: Secondary | ICD-10-CM

## 2019-12-02 NOTE — Progress Notes (Signed)
Nutrition Follow-up:  Patient with stage IV esophageal cancer.  Patient receiving chemotherapy.   Spoke with patient via phone for nutrition follow-up.  Patient reports that he starting tube feeding yesterday (1/2 carton 4 times per day) and tolerated well.  Is having increased acid reflux after feedings.  Reports that he has tolerated 1 full carton of formula this am.  Has not had bowel movement but says that he took something this am to help.    Reports that he is drinking water orally, sips slowly  Medications: protonix 34ml BID  Labs: reviewed  Anthropometrics:   No new   Estimated Energy Needs  Kcals: 2600-3000 Protein: 130-150 g Fluid: > 2.6 L  NUTRITION DIAGNOSIS: Inadequate oral intake continues   INTERVENTION:  Discussed with patient importance of slowing administration of tube feeding down.  Sit up during and for at least 1 hour after feeding to help with reflux. Message sent to Dr Janese Banks and team regarding any medication adjustments for reflux. Recommend checking refeeding labs (CMP, Mag, Phos) on 10/22 (during fluids) and 10/26 next treatment and supplementing if low. Patient has contact information    MONITORING, EVALUATION, GOAL: weight trends, intake, tube feeding   NEXT VISIT: October 28 phone f/u  Marketa Midkiff B. Zenia Resides, [place], [place] Registered Dietitian [phone] (mobile)

## 2019-12-03 ENCOUNTER — Inpatient Hospital Stay: Payer: 59

## 2019-12-03 ENCOUNTER — Other Ambulatory Visit: Payer: Self-pay

## 2019-12-03 VITALS — BP 126/77 | HR 77 | Temp 97.0°F

## 2019-12-03 DIAGNOSIS — C159 Malignant neoplasm of esophagus, unspecified: Secondary | ICD-10-CM

## 2019-12-03 DIAGNOSIS — Z5112 Encounter for antineoplastic immunotherapy: Secondary | ICD-10-CM | POA: Diagnosis not present

## 2019-12-03 DIAGNOSIS — R110 Nausea: Secondary | ICD-10-CM

## 2019-12-03 DIAGNOSIS — Z789 Other specified health status: Secondary | ICD-10-CM

## 2019-12-03 LAB — MAGNESIUM: Magnesium: 1.8 mg/dL (ref 1.7–2.4)

## 2019-12-03 LAB — CBC WITH DIFFERENTIAL/PLATELET
Abs Immature Granulocytes: 0.01 10*3/uL (ref 0.00–0.07)
Basophils Absolute: 0 10*3/uL (ref 0.0–0.1)
Basophils Relative: 1 %
Eosinophils Absolute: 0.1 10*3/uL (ref 0.0–0.5)
Eosinophils Relative: 3 %
HCT: 34.9 % — ABNORMAL LOW (ref 39.0–52.0)
Hemoglobin: 12.2 g/dL — ABNORMAL LOW (ref 13.0–17.0)
Immature Granulocytes: 0 %
Lymphocytes Relative: 15 %
Lymphs Abs: 0.8 10*3/uL (ref 0.7–4.0)
MCH: 31.7 pg (ref 26.0–34.0)
MCHC: 35 g/dL (ref 30.0–36.0)
MCV: 90.6 fL (ref 80.0–100.0)
Monocytes Absolute: 0.5 10*3/uL (ref 0.1–1.0)
Monocytes Relative: 9 %
Neutro Abs: 4.2 10*3/uL (ref 1.7–7.7)
Neutrophils Relative %: 72 %
Platelets: 160 10*3/uL (ref 150–400)
RBC: 3.85 MIL/uL — ABNORMAL LOW (ref 4.22–5.81)
RDW: 13.2 % (ref 11.5–15.5)
WBC: 5.7 10*3/uL (ref 4.0–10.5)
nRBC: 0 % (ref 0.0–0.2)

## 2019-12-03 LAB — COMPREHENSIVE METABOLIC PANEL
ALT: 32 U/L (ref 0–44)
AST: 30 U/L (ref 15–41)
Albumin: 3.2 g/dL — ABNORMAL LOW (ref 3.5–5.0)
Alkaline Phosphatase: 73 U/L (ref 38–126)
Anion gap: 11 (ref 5–15)
BUN: 14 mg/dL (ref 6–20)
CO2: 26 mmol/L (ref 22–32)
Calcium: 9.1 mg/dL (ref 8.9–10.3)
Chloride: 93 mmol/L — ABNORMAL LOW (ref 98–111)
Creatinine, Ser: 1.2 mg/dL (ref 0.61–1.24)
GFR, Estimated: >60 mL/min (ref >60)
Glucose, Bld: 117 mg/dL — ABNORMAL HIGH (ref 70–99)
Potassium: 3.7 mmol/L (ref 3.5–5.1)
Sodium: 130 mmol/L — ABNORMAL LOW (ref 135–145)
Total Bilirubin: 1 mg/dL (ref 0.3–1.2)
Total Protein: 6.8 g/dL (ref 6.5–8.1)

## 2019-12-03 LAB — PHOSPHORUS: Phosphorus: 2.8 mg/dL (ref 2.5–4.6)

## 2019-12-03 MED ORDER — SODIUM CHLORIDE 0.9% FLUSH
10.0000 mL | Freq: Once | INTRAVENOUS | Status: AC
  Administered 2019-12-03: 10 mL via INTRAVENOUS
  Filled 2019-12-03: qty 10

## 2019-12-03 MED ORDER — HEPARIN SOD (PORK) LOCK FLUSH 100 UNIT/ML IV SOLN
INTRAVENOUS | Status: AC
  Filled 2019-12-03: qty 5

## 2019-12-03 MED ORDER — DEXAMETHASONE SODIUM PHOSPHATE 10 MG/ML IJ SOLN
10.0000 mg | Freq: Once | INTRAMUSCULAR | Status: AC
  Administered 2019-12-03: 10 mg via INTRAVENOUS
  Filled 2019-12-03: qty 1

## 2019-12-03 MED ORDER — SODIUM CHLORIDE 0.9 % IV SOLN: 10.0000 mg | Freq: Once | INTRAVENOUS | Status: DC

## 2019-12-03 MED ORDER — SODIUM CHLORIDE 0.9 % IV SOLN
INTRAVENOUS | Status: AC
  Filled 2019-12-03: qty 250

## 2019-12-03 MED ORDER — HEPARIN SOD (PORK) LOCK FLUSH 100 UNIT/ML IV SOLN
500.0000 [IU] | Freq: Once | INTRAVENOUS | Status: AC
  Administered 2019-12-03: 500 [IU] via INTRAVENOUS
  Filled 2019-12-03: qty 5

## 2019-12-06 ENCOUNTER — Inpatient Hospital Stay: Payer: 59

## 2019-12-06 ENCOUNTER — Inpatient Hospital Stay: Payer: 59 | Admitting: Oncology

## 2019-12-06 ENCOUNTER — Encounter: Payer: Self-pay | Admitting: Family Medicine

## 2019-12-06 NOTE — Telephone Encounter (Signed)
Medical records request sent on letterhead faxed to [facility]

## 2019-12-07 ENCOUNTER — Other Ambulatory Visit: Payer: Self-pay

## 2019-12-07 ENCOUNTER — Other Ambulatory Visit: Payer: Self-pay | Admitting: *Deleted

## 2019-12-07 ENCOUNTER — Ambulatory Visit: Payer: 59

## 2019-12-07 ENCOUNTER — Inpatient Hospital Stay: Payer: 59

## 2019-12-07 ENCOUNTER — Encounter: Payer: Self-pay | Admitting: Oncology

## 2019-12-07 ENCOUNTER — Inpatient Hospital Stay (HOSPITAL_BASED_OUTPATIENT_CLINIC_OR_DEPARTMENT_OTHER): Payer: 59 | Admitting: Oncology

## 2019-12-07 VITALS — BP 124/85 | HR 44 | Temp 99.2°F | Resp 16 | Wt 247.5 lb

## 2019-12-07 VITALS — HR 101

## 2019-12-07 DIAGNOSIS — Z5112 Encounter for antineoplastic immunotherapy: Secondary | ICD-10-CM | POA: Diagnosis not present

## 2019-12-07 DIAGNOSIS — R110 Nausea: Secondary | ICD-10-CM

## 2019-12-07 DIAGNOSIS — Z5111 Encounter for antineoplastic chemotherapy: Secondary | ICD-10-CM

## 2019-12-07 DIAGNOSIS — G893 Neoplasm related pain (acute) (chronic): Secondary | ICD-10-CM

## 2019-12-07 DIAGNOSIS — G8929 Other chronic pain: Secondary | ICD-10-CM

## 2019-12-07 DIAGNOSIS — C159 Malignant neoplasm of esophagus, unspecified: Secondary | ICD-10-CM

## 2019-12-07 DIAGNOSIS — E871 Hypo-osmolality and hyponatremia: Secondary | ICD-10-CM

## 2019-12-07 DIAGNOSIS — D701 Agranulocytosis secondary to cancer chemotherapy: Secondary | ICD-10-CM

## 2019-12-07 DIAGNOSIS — M545 Low back pain, unspecified: Secondary | ICD-10-CM

## 2019-12-07 DIAGNOSIS — T451X5A Adverse effect of antineoplastic and immunosuppressive drugs, initial encounter: Secondary | ICD-10-CM

## 2019-12-07 LAB — COMPREHENSIVE METABOLIC PANEL
ALT: 39 U/L (ref 0–44)
AST: 29 U/L (ref 15–41)
Albumin: 3.4 g/dL — ABNORMAL LOW (ref 3.5–5.0)
Alkaline Phosphatase: 85 U/L (ref 38–126)
Anion gap: 11 (ref 5–15)
BUN: 17 mg/dL (ref 6–20)
CO2: 27 mmol/L (ref 22–32)
Calcium: 10.1 mg/dL (ref 8.9–10.3)
Chloride: 90 mmol/L — ABNORMAL LOW (ref 98–111)
Creatinine, Ser: 1.35 mg/dL — ABNORMAL HIGH (ref 0.61–1.24)
GFR, Estimated: >60 mL/min (ref >60)
Glucose, Bld: 128 mg/dL — ABNORMAL HIGH (ref 70–99)
Potassium: 4.2 mmol/L (ref 3.5–5.1)
Sodium: 128 mmol/L — ABNORMAL LOW (ref 135–145)
Total Bilirubin: 1.1 mg/dL (ref 0.3–1.2)
Total Protein: 7.1 g/dL (ref 6.5–8.1)

## 2019-12-07 LAB — CBC WITH DIFFERENTIAL/PLATELET
Abs Immature Granulocytes: 0.01 10*3/uL (ref 0.00–0.07)
Basophils Absolute: 0 10*3/uL (ref 0.0–0.1)
Basophils Relative: 1 %
Eosinophils Absolute: 0.1 10*3/uL (ref 0.0–0.5)
Eosinophils Relative: 4 %
HCT: 36.8 % — ABNORMAL LOW (ref 39.0–52.0)
Hemoglobin: 12.6 g/dL — ABNORMAL LOW (ref 13.0–17.0)
Immature Granulocytes: 0 %
Lymphocytes Relative: 21 %
Lymphs Abs: 0.7 10*3/uL (ref 0.7–4.0)
MCH: 31 pg (ref 26.0–34.0)
MCHC: 34.2 g/dL (ref 30.0–36.0)
MCV: 90.6 fL (ref 80.0–100.0)
Monocytes Absolute: 1.2 10*3/uL — ABNORMAL HIGH (ref 0.1–1.0)
Monocytes Relative: 37 %
Neutro Abs: 1.1 10*3/uL — ABNORMAL LOW (ref 1.7–7.7)
Neutrophils Relative %: 37 %
Platelets: 221 10*3/uL (ref 150–400)
RBC: 4.06 MIL/uL — ABNORMAL LOW (ref 4.22–5.81)
RDW: 14 % (ref 11.5–15.5)
WBC: 3.1 10*3/uL — ABNORMAL LOW (ref 4.0–10.5)
nRBC: 0 % (ref 0.0–0.2)

## 2019-12-07 MED ORDER — PALONOSETRON HCL INJECTION 0.25 MG/5ML
0.2500 mg | Freq: Once | INTRAVENOUS | Status: AC
  Administered 2019-12-07: 0.25 mg via INTRAVENOUS
  Filled 2019-12-07: qty 5

## 2019-12-07 MED ORDER — OXALIPLATIN CHEMO INJECTION 100 MG/20ML
65.0000 mg/m2 | Freq: Once | INTRAVENOUS | Status: AC
  Administered 2019-12-07: 170 mg via INTRAVENOUS
  Filled 2019-12-07: qty 34

## 2019-12-07 MED ORDER — HEPARIN SOD (PORK) LOCK FLUSH 100 UNIT/ML IV SOLN
500.0000 [IU] | Freq: Once | INTRAVENOUS | Status: DC
  Filled 2019-12-07: qty 5

## 2019-12-07 MED ORDER — LEUCOVORIN CALCIUM INJECTION 350 MG
1000.0000 mg | Freq: Once | INTRAVENOUS | Status: AC
  Administered 2019-12-07: 1000 mg via INTRAVENOUS
  Filled 2019-12-07: qty 50

## 2019-12-07 MED ORDER — DEXTROSE 5 % IV SOLN
Freq: Once | INTRAVENOUS | Status: AC
  Filled 2019-12-07: qty 250

## 2019-12-07 MED ORDER — FLUOROURACIL CHEMO INJECTION 2.5 GM/50ML
1000.0000 mg | Freq: Once | INTRAVENOUS | Status: AC
  Administered 2019-12-07: 1000 mg via INTRAVENOUS
  Filled 2019-12-07: qty 20

## 2019-12-07 MED ORDER — SODIUM CHLORIDE 0.9 % IV SOLN
2400.0000 mg/m2 | INTRAVENOUS | Status: DC
  Administered 2019-12-07: 6250 mg via INTRAVENOUS
  Filled 2019-12-07: qty 125

## 2019-12-07 MED ORDER — SODIUM CHLORIDE 0.9 % IV SOLN
Freq: Once | INTRAVENOUS | Status: AC
  Administered 2019-12-07: 1000 mL via INTRAVENOUS
  Filled 2019-12-07: qty 250

## 2019-12-07 MED ORDER — SODIUM CHLORIDE 0.9 % IV SOLN
10.0000 mg | Freq: Once | INTRAVENOUS | Status: AC
  Administered 2019-12-07: 10 mg via INTRAVENOUS
  Filled 2019-12-07: qty 10

## 2019-12-07 MED ORDER — MORPHINE SULFATE (PF) 2 MG/ML IV SOLN
4.0000 mg | Freq: Once | INTRAVENOUS | Status: AC
  Administered 2019-12-07: 4 mg via INTRAVENOUS
  Filled 2019-12-07: qty 2

## 2019-12-07 MED ORDER — SENNOSIDES 8.8 MG/5ML PO SYRP: 10.0000 mL | ORAL_SOLUTION | Freq: Every day | ORAL | 3 refills | Status: AC

## 2019-12-07 MED ORDER — SODIUM CHLORIDE 0.9% FLUSH
10.0000 mL | Freq: Once | INTRAVENOUS | Status: AC
  Administered 2019-12-07: 10 mL via INTRAVENOUS
  Filled 2019-12-07: qty 10

## 2019-12-07 NOTE — Progress Notes (Signed)
ANC: 1100. MD, Dr. Janese Banks, notified and aware. Per MD order: proceed with scheduled FOLFOX treatment today.

## 2019-12-07 NOTE — Progress Notes (Signed)
Pt manually about heart rate and it is 101 and dr Janese Banks ok to proceed with treatment.

## 2019-12-07 NOTE — Progress Notes (Signed)
Patient is still not on full bottles with tube feeds and he has indigestion and reflux. He is having constipation and has tried using stool softners but no success. Pt is drinking ginerale and water orally.

## 2019-12-08 ENCOUNTER — Ambulatory Visit
Admission: RE | Admit: 2019-12-08 | Discharge: 2019-12-08 | Disposition: A | Discharge status: Home / Self Care | Payer: 59 | Source: Ambulatory Visit | Attending: Radiation Oncology | Admitting: Radiation Oncology

## 2019-12-08 DIAGNOSIS — Z5112 Encounter for antineoplastic immunotherapy: Secondary | ICD-10-CM | POA: Diagnosis not present

## 2019-12-08 MED ORDER — OXYCODONE HCL 5 MG PO TABS: 5.0000 mg | ORAL_TABLET | ORAL | 0 refills | Status: DC | PRN

## 2019-12-09 ENCOUNTER — Other Ambulatory Visit: Payer: Self-pay | Admitting: *Deleted

## 2019-12-09 ENCOUNTER — Inpatient Hospital Stay: Payer: 59

## 2019-12-09 ENCOUNTER — Ambulatory Visit
Admission: RE | Admit: 2019-12-09 | Discharge: 2019-12-09 | Disposition: A | Discharge status: Home / Self Care | Payer: 59 | Source: Ambulatory Visit | Attending: Radiation Oncology | Admitting: Radiation Oncology

## 2019-12-09 ENCOUNTER — Other Ambulatory Visit: Payer: Self-pay

## 2019-12-09 VITALS — BP 141/72 | HR 96 | Temp 96.6°F | Resp 16 | Wt 257.2 lb

## 2019-12-09 DIAGNOSIS — R110 Nausea: Secondary | ICD-10-CM

## 2019-12-09 DIAGNOSIS — Z5112 Encounter for antineoplastic immunotherapy: Secondary | ICD-10-CM | POA: Diagnosis not present

## 2019-12-09 DIAGNOSIS — C159 Malignant neoplasm of esophagus, unspecified: Secondary | ICD-10-CM

## 2019-12-09 DIAGNOSIS — G893 Neoplasm related pain (acute) (chronic): Secondary | ICD-10-CM

## 2019-12-09 MED ORDER — SODIUM CHLORIDE 0.9 % IV SOLN
Freq: Once | INTRAVENOUS | Status: AC
  Filled 2019-12-09: qty 250

## 2019-12-09 MED ORDER — HEPARIN SOD (PORK) LOCK FLUSH 100 UNIT/ML IV SOLN
500.0000 [IU] | Freq: Once | INTRAVENOUS | Status: AC | PRN
  Administered 2019-12-09: 500 [IU]
  Filled 2019-12-09: qty 5

## 2019-12-09 MED ORDER — SODIUM CHLORIDE 0.9 % IV SOLN
400.0000 mg | Freq: Once | INTRAVENOUS | Status: AC
  Administered 2019-12-09: 400 mg via INTRAVENOUS
  Filled 2019-12-09: qty 16

## 2019-12-09 MED ORDER — METOCLOPRAMIDE HCL 5 MG/5ML PO SOLN: 5.0000 mg | Freq: Three times a day (TID) | ORAL | 2 refills | Status: DC

## 2019-12-09 MED ORDER — OXYCODONE HCL 5 MG/5ML PO SOLN
5.0000 mg | ORAL | 0 refills | Status: DC | PRN
Start: 2019-12-09 — End: 2019-12-27

## 2019-12-09 MED ORDER — PEGFILGRASTIM-CBQV 6 MG/0.6ML ~~LOC~~ SOSY
6.0000 mg | PREFILLED_SYRINGE | Freq: Once | SUBCUTANEOUS | Status: AC
  Administered 2019-12-09: 6 mg via SUBCUTANEOUS
  Filled 2019-12-09: qty 0.6

## 2019-12-09 MED ORDER — SODIUM CHLORIDE 0.9% FLUSH
10.0000 mL | INTRAVENOUS | Status: DC | PRN
  Administered 2019-12-09: 10 mL
  Filled 2019-12-09: qty 10

## 2019-12-09 MED ORDER — ONDANSETRON HCL 4 MG/2ML IJ SOLN
8.0000 mg | Freq: Once | INTRAMUSCULAR | Status: AC
  Administered 2019-12-09: 8 mg via INTRAVENOUS
  Filled 2019-12-09: qty 4

## 2019-12-09 MED ORDER — HEPARIN SOD (PORK) LOCK FLUSH 100 UNIT/ML IV SOLN
INTRAVENOUS | Status: AC
  Filled 2019-12-09: qty 5

## 2019-12-09 MED ORDER — MORPHINE SULFATE (PF) 2 MG/ML IV SOLN
4.0000 mg | Freq: Once | INTRAVENOUS | Status: AC
  Administered 2019-12-09: 4 mg via INTRAVENOUS
  Filled 2019-12-09: qty 2

## 2019-12-09 NOTE — Progress Notes (Signed)
Error

## 2019-12-09 NOTE — Progress Notes (Addendum)
Nutrition Follow-up:  Patient with stage IV esophageal cancer.  Patient receiving chemotherapy.    Spoke with patient and wife in clinic this am.  Patient has not been able to take any formula in since 10/26 due to nausea, bloating, full feeling.  Wife reports that this am gave water flush through tube and patient vomited shortly after giving water flush.  Prior to Tuesday patient was only able to take in 2 1/2 cartons of [employer] 1.4 formula per day (goal rate is 7) via bolus feeding due to above symptoms.  Patient's last bowel movement was 1 week ago.  Patient has senokot and miralax to start taking. Wife concerned about increase drainage coming from around PEG tube.     Patient unable to take sips of liquids due to liquids being vomited back up.  Wife reports increased belching during the day as well.    Medications: zyprexa, zofran, protonix, compazine, senokot, miralax,  Labs: Na 128, glucose 128, creatinine 1.35, BUN 17  Anthropometrics:   Weight 247 lb on 10/26 decreased from 267 lb on 10/15  7% weight loss in the last 11 days, signficant  UBW of 300 lb   Estimated Energy Needs  Kcals: 2600-3000 Protein: 130-150 g Fluid: > 2.6 L  NUTRITION DIAGNOSIS: Inadequate oral intake continues and unable to tolerate bolus feeding via PEG   INTERVENTION:  Discussed option of gravity bag feeding or pump feeding with patient and wife to slow rate of tube feeding over longer period of time.   Recommend [employer] 1.4 at goal rate of 120 ml/hr for 20 hours (7 cartons/day) via continuous pump to meet nutritional needs.  Recommend starting at 30 ml/hr and if tolerating after 8 hours increase by 40ml q 8 hours until goal rate of 120 ml/hr.   Flush with 180 ml water at start of feeding and when stopping feeding.  Additional 180 ml TID during the day will be needed of water if unable to drink liquids.   Coordination of care with [employer] regarding needing pump for feeding.   Encouraged bowel  regimen to promote bowel movement.  Recommend trial of reglan and discussed with Dr. Janese Banks.  RN, Sherri spoke with patient and wife regarding feeding tube and drainage.  Planning on having surgeon evaluate.      MONITORING, EVALUATION, GOAL: weight trends, tube feeding tolerance   NEXT VISIT: Nov 4th, if not sooner  Jamez Ambrocio B. Zenia Resides, [place], [place] Registered Dietitian [phone] (mobile)

## 2019-12-10 MED ORDER — FENTANYL 12 MCG/HR TD PT72: 1.0000 | MEDICATED_PATCH | TRANSDERMAL | 0 refills | Status: DC

## 2019-12-10 NOTE — Progress Notes (Signed)
Hematology/Oncology Consult note [facility]  [phone] [phone]  Patient Care Team: Elby Beck, FNP as PCP - General (Nurse Practitioner) Clent Jacks, RN as Oncology Nurse Navigator Sindy Guadeloupe, MD as Consulting Physician (Oncology)   Name of the patient: Jacob Henson  [id number]  September 06, 1960   Date of visit: 12/10/19  Diagnosis- metastatic esophageal adenocarcinoma with bone metastases  Chief complaint/ Reason for visit-on treatment assessment prior to cycle 2 of FOLFOX chemotherapy  Heme/Onc history:  Patient is a 59 year old male who was admitted to [facility] with symptoms of nausea vomiting poor appetite as well as worsening right scapular pain he underwent CT chest abdomen pelvis with contrast on 11/09/2019 which showed mural thickening of the lower one third of the esophagus consistent with esophageal malignancy. Soft tissue lesions in the right scapula and left iliac crest consistent with osseous metastatic disease. Soft tissue nodules at the celiac trunk and the GE junction concerning for metastatic lymphadenopathy. Nonspecific left upper lobe pulmonary nodule and right paracolic peritoneal nodule. He underwent FNA of the right scapular lytic lesion which was consistent with adenocarcinoma. Malignant cells positive for CK7 and CK20 and negative for TTF-1 and PAX 3 and CDX2 was negative. Differential diagnosis includes pancreaticobiliary and GI origin.CEA elevated at 135. CBC was unremarkable. 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  He has ongoing tube feeds going but is unable to do for feeds and is doing up to 3 feeds a day but still having issues with feeling full easily and feeling nauseous.  Unable to swallow much other than soft foods.  Reports pain in his right scapula  ECOG PS- 2 Pain scale- 7 Opioid associated constipation- no  Review of systems- Review of Systems  Constitutional: Positive for malaise/fatigue. Negative for chills, fever and weight loss.  HENT: Negative for congestion, ear discharge and nosebleeds.   Eyes: Negative for blurred vision.  Respiratory: Negative for cough, hemoptysis, sputum production, shortness of breath and wheezing.   Cardiovascular: Negative for chest pain, palpitations, orthopnea and claudication.  Gastrointestinal: Positive for nausea and vomiting. Negative for abdominal pain, blood in stool, constipation, diarrhea, heartburn and melena.  Genitourinary: Negative for dysuria, flank pain, frequency, hematuria and urgency.  Musculoskeletal: Negative for back pain, joint pain and myalgias.       Right scapular pain  Skin: Negative for rash.  Neurological: Negative for dizziness, tingling, focal weakness, seizures, weakness and headaches.  Endo/Heme/Allergies: Does not bruise/bleed easily.  Psychiatric/Behavioral: Negative for depression and suicidal ideas. The patient does not have insomnia.        No Known Allergies   Past Medical History:  Diagnosis Date   Allergy    Cancer ([place])    Esophageal cancer ([place])    GERD (gastroesophageal reflux disease)    Has required esophageal dilation in past   Hypertension      Past Surgical History:  Procedure Laterality Date   ESOPHAGEAL DILATION     PEG TUBE PLACEMENT  11/30/2019   PORTA CATH INSERTION  N/A 11/19/2019   Procedure: PORTA CATH INSERTION;   Surgeon: Algernon Huxley, MD;  Location: [facility];  Service: Cardiovascular;  Laterality: N/A;    Social History   Socioeconomic History   Marital status: Married    Spouse name: Not on file   Number of children: Not on file   Years of education: Not on file   Highest education level: Not on file  Occupational History   Occupation: full time  Tobacco Use   Smoking status: Former Smoker    Packs/day: 1.00    Years: 4.00    Pack years: 4.00    Types: Cigarettes    Quit date: 10/14/1978    Years since quitting: 41.1   Smokeless tobacco: Never Used  Vaping Use   Vaping Use: Never used  Substance and Sexual Activity   Alcohol use: No   Drug use: Never   Sexual activity: Not Currently  Other Topics Concern   Not on file  Social History Narrative   Not on file   Social Determinants of Health   Financial Resource Strain:    Difficulty of Paying Living Expenses: Not on file  Food Insecurity:    Worried About Charity fundraiser in the Last Year: Not on file   [employer] of Food in the Last Year: Not on file  Transportation Needs:    Lack of Transportation (Medical): Not on file   Lack of Transportation (Non-Medical): Not on file  Physical Activity:    Days of Exercise per Week: Not on file   Minutes of Exercise per Session: Not on file  Stress:    Feeling of Stress : Not on file  Social Connections:    Frequency of Communication with Friends and Family: Not on file   Frequency of Social Gatherings with Friends and Family: Not on file   Attends Religious Services: Not on file   Active Member of Clubs or Organizations: Not on file   Attends Archivist Meetings: Not on file   Marital Status: Not on file  Intimate Partner Violence:    Fear of Current or Ex-Partner: Not on file   Emotionally Abused: Not on file   Physically Abused: Not on file   Sexually Abused: Not on file    Family History  Problem Relation Age of Onset     Heart disease Father    Hearing loss Father    Diabetes Father    Heart disease Paternal Grandmother    Diabetes Paternal Grandmother    Heart attack Paternal Grandfather      Current Outpatient Medications:    fluticasone (FLONASE) 50 MCG/ACT nasal spray, Place 2 sprays into both nostrils daily. (Patient taking differently: Place 2 sprays into both nostrils 2 (two) times daily. ), Disp: 16 g, Rfl: 6   lidocaine-prilocaine (EMLA) cream, Apply to affected area once (Patient taking differently: Apply 1 application topically once. Apply to affected area once), Disp: 30 g, Rfl: 3   OLANZapine (ZYPREXA) 10 MG tablet, Take 1 tablet (10 mg total) by mouth at bedtime., Disp: 30 tablet, Rfl: 3   ondansetron (ZOFRAN-ODT) 8 MG disintegrating tablet, Take 1 tablet (8 mg total) by mouth every 8 (eight) hours as needed for nausea or vomiting., Disp: 30 tablet, Rfl: 1   oxyCODONE (OXYCONTIN) 10 mg 12 hr tablet, Take 1 tablet (10 mg total) by mouth every 12 (twelve) hours., Disp: 60 tablet, Rfl: 0   pantoprazole (PROTONIX) 40 MG tablet, Take 40 mg  by mouth 2 (two) times daily. , Disp: , Rfl:    prochlorperazine (COMPAZINE) 10 MG tablet, Take 1 tablet (10 mg total) by mouth every 6 (six) hours as needed for nausea or vomiting., Disp: 30 tablet, Rfl: 2   fentaNYL (DURAGESIC) 12 MCG/HR, Place 1 patch onto the skin every 3 (three) days., Disp: 10 patch, Rfl: 0   levocetirizine (XYZAL) 5 MG tablet, Take 5 mg by mouth daily.  (Patient not taking: Reported on 12/07/2019), Disp: , Rfl:    lisinopril (ZESTRIL) 20 MG tablet, Take 1 tablet (20 mg total) by mouth daily. (Patient not taking: Reported on 12/07/2019), Disp: 90 tablet, Rfl: 3   metoCLOPramide (REGLAN) 5 MG/5ML solution, Take 5 mLs (5 mg total) by mouth 3 (three) times daily before meals., Disp: 450 mL, Rfl: 2   oxyCODONE (ROXICODONE) 5 MG/5ML solution, Take 5 mLs (5 mg total) by mouth every 4 (four) hours as needed for severe pain., Disp:  180 mL, Rfl: 0   oxyCODONE-acetaminophen (PERCOCET) 5-325 MG tablet, Take 1-2 tablets by mouth every 4 (four) hours as needed for severe pain. (Patient not taking: Reported on 12/07/2019), Disp: 20 tablet, Rfl: 0   sennosides (SENOKOT) 8.8 MG/5ML syrup, Take 10 mLs by mouth at bedtime. Through peg tube, and hold if diarrhea., Disp: 237 mL, Rfl: 3   simethicone (MYLICON) 829 MG chewable tablet, Chew 125 mg by mouth every 6 (six) hours as needed for flatulence. (Patient not taking: Reported on 12/07/2019), Disp: , Rfl:    sucralfate (CARAFATE) 1 GM/10ML suspension, Take 1 g by mouth 4 (four) times daily as needed (stomach pain).  (Patient not taking: Reported on 12/07/2019), Disp: , Rfl:   Physical exam:  Vitals:   12/07/19 0919  BP: 124/85  Pulse: (!) 44  Resp: 16  Temp: 99.2 F (37.3 C)  TempSrc: Tympanic  SpO2: 98%  Weight: 247 lb 8 oz (112.3 kg)   Physical Exam Constitutional:      Comments: Appears fatigued  Cardiovascular:     Rate and Rhythm: Normal rate and regular rhythm.     Heart sounds: Normal heart sounds.  Pulmonary:     Effort: Pulmonary effort is normal.     Breath sounds: Normal breath sounds.  Abdominal:     General: Bowel sounds are normal.     Palpations: Abdomen is soft.     Comments: PEG tube in place  Skin:    General: Skin is warm and dry.  Neurological:     Mental Status: He is alert and oriented to person, place, and time.      CMP Latest Ref Rng & Units 12/07/2019  Glucose 70 - 99 mg/dL 128(H)  BUN 6 - 20 mg/dL 17  Creatinine 0.61 - 1.24 mg/dL 1.35(H)  Sodium 135 - 145 mmol/L 128(L)  Potassium 3.5 - 5.1 mmol/L 4.2  Chloride 98 - 111 mmol/L 90(L)  CO2 22 - 32 mmol/L 27  Calcium 8.9 - 10.3 mg/dL 10.1  Total Protein 6.5 - 8.1 g/dL 7.1  Total Bilirubin 0.3 - 1.2 mg/dL 1.1  Alkaline Phos 38 - 126 U/L 85  AST 15 - 41 U/L 29  ALT 0 - 44 U/L 39   CBC Latest Ref Rng & Units 12/07/2019  WBC 4.0 - 10.5 K/uL 3.1(L)  Hemoglobin 13.0 - 17.0 g/dL  12.6(L)  Hematocrit 39 - 52 % 36.8(L)  Platelets 150 - 400 K/uL 221    No images are attached to the encounter.  PERIPHERAL VASCULAR CATHETERIZATION  Result Date: 11/19/2019 See op note  NM PET Image Initial (PI) Skull Base To Thigh  Result Date: 11/23/2019 CLINICAL DATA:  Initial treatment strategy for metastatic esophageal adenocarcinoma. Known right scapular metastasis. EXAM: NUCLEAR MEDICINE PET SKULL BASE TO THIGH TECHNIQUE: 15.4 mCi F-18 FDG was injected intravenously. Full-ring PET imaging was performed from the skull base to thigh after the radiotracer. CT data was obtained and used for attenuation correction and anatomic localization. Fasting blood glucose: 121 mg/dl COMPARISON:  CT abdomen/pelvis dated 10/21/2019. MRI right shoulder dated 10/16/2019. FINDINGS: Mediastinal blood pool activity: SUV max 3.7 Liver activity: SUV max NA NECK: No hypermetabolic cervical lymphadenopathy. Incidental CT findings: none CHEST: Mass involving the lower 3rd of the esophagus (series 3/image 123), max SUV 15.0, corresponding to the patient's known primary esophageal neoplasm. 10 mm anterior left upper lobe nodule (series 3/image 85), max SUV 4.0, compatible with pulmonary metastasis. No hypermetabolic thoracic lymphadenopathy. Right chest port terminates in the lower SVC. Incidental CT findings: Mild atherosclerotic calcifications of the aortic arch. ABDOMEN/PELVIS: 3.0 cm right adrenal metastasis (series 3/image 152), max SUV 9.0. 8 mm short axis celiac axis node (series 3/image 149), max SUV 6.4. Incidental CT findings: Layering gallbladder sludge versus noncalcified gallstones. Mild atherosclerotic calcifications of the abdominal aorta and branch vessels. SKELETON: Widespread osseous metastases throughout the visualized axial and appendicular skeleton, including: --Destructive right scapular mass, max SUV 7.7 --Right sternum, max SUV 7.4 --Right 5th costosternal junction, max SUV 7.1 --T12 vertebral body,  max SUV 12.4 --Destructive left iliac bone mass, max SUV 11.4 --Right proximal femoral shaft, max SUV 5.4 Incidental CT findings: Degenerative changes of the visualized thoracolumbar spine. IMPRESSION: Distal esophageal mass, corresponding to the patient's known primary esophageal neoplasm. 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 Widespread osseous metastases, including destructive lesions in the right scapula and left iliac bone, as above. Electronically Signed   By: Julian Hy M.D.   On: 11/23/2019 11:43     Assessment and plan- Patient is a 59 y.o. 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  His white cell count is 3.1 with an ANC of 1.1 and will therefore receive Udenyca on day 3.  Hemoglobin and platelet counts are stable.  He will be starting palliative radiation treatment to his esophagus soon and at that time I will hold off on giving him immunotherapy.  Given that his CPS score is 20 I would like to proceed with Keytruda as well at 400 mg every 6 weeks and he will receive that this time on day 3 of pump disconnect since we do not have the dose with [place] today.  Hyponatremia: He will receive 1 L of IV fluids today.  Port labs CBC with differential, CMP and see me in 2 weeks for FOLFOX on day 1 and pump disconnect on day 3  Neoplasm related pain: He will continue as needed oxycodone and we will start him on OxyContin 10 mg every 12.  If patient continues to have difficulty in taking pills by mouth we will switch him to fentanyl patch  Chemo/tube feed-induced nausea: We will start him on olanzapine 10 mg at bedtime  Will discuss bisphosphonates again after completion of palliative radiation Visit Diagnosis 1. Esophageal adenocarcinoma  ([place])   2. Encounter for antineoplastic chemotherapy  3. Encounter for antineoplastic immunotherapy   4. Chemotherapy-induced nausea   5. Neoplasm related pain   6. Chemotherapy induced neutropenia ([facility])   7. Hyponatremia      Dr. Randa Evens, MD, MPH [employer] at [facility] [phone] 12/10/2019 10:31 AM

## 2019-12-13 ENCOUNTER — Encounter: Payer: Self-pay | Admitting: Surgery

## 2019-12-13 ENCOUNTER — Other Ambulatory Visit: Payer: Self-pay

## 2019-12-13 ENCOUNTER — Ambulatory Visit
Admission: RE | Admit: 2019-12-13 | Discharge: 2019-12-13 | Disposition: A | Discharge status: Home / Self Care | Payer: 59 | Source: Ambulatory Visit | Attending: Radiation Oncology | Admitting: Radiation Oncology

## 2019-12-13 ENCOUNTER — Ambulatory Visit (INDEPENDENT_AMBULATORY_CARE_PROVIDER_SITE_OTHER): Payer: 59 | Admitting: Surgery

## 2019-12-13 VITALS — BP 117/62 | HR 109 | Temp 97.8°F | Resp 12 | Ht 77.0 in | Wt 287.0 lb

## 2019-12-13 DIAGNOSIS — C7951 Secondary malignant neoplasm of bone: Secondary | ICD-10-CM | POA: Insufficient documentation

## 2019-12-13 DIAGNOSIS — Z87891 Personal history of nicotine dependence: Secondary | ICD-10-CM | POA: Insufficient documentation

## 2019-12-13 DIAGNOSIS — Z5111 Encounter for antineoplastic chemotherapy: Secondary | ICD-10-CM | POA: Insufficient documentation

## 2019-12-13 DIAGNOSIS — Z1152 Encounter for screening for COVID-19: Secondary | ICD-10-CM | POA: Insufficient documentation

## 2019-12-13 DIAGNOSIS — Z5112 Encounter for antineoplastic immunotherapy: Secondary | ICD-10-CM | POA: Diagnosis present

## 2019-12-13 DIAGNOSIS — C159 Malignant neoplasm of esophagus, unspecified: Secondary | ICD-10-CM | POA: Insufficient documentation

## 2019-12-13 DIAGNOSIS — G893 Neoplasm related pain (acute) (chronic): Secondary | ICD-10-CM | POA: Diagnosis not present

## 2019-12-13 DIAGNOSIS — E871 Hypo-osmolality and hyponatremia: Secondary | ICD-10-CM | POA: Diagnosis not present

## 2019-12-13 DIAGNOSIS — D701 Agranulocytosis secondary to cancer chemotherapy: Secondary | ICD-10-CM | POA: Insufficient documentation

## 2019-12-13 DIAGNOSIS — Z09 Encounter for follow-up examination after completed treatment for conditions other than malignant neoplasm: Secondary | ICD-10-CM

## 2019-12-13 DIAGNOSIS — Z5189 Encounter for other specified aftercare: Secondary | ICD-10-CM | POA: Diagnosis not present

## 2019-12-13 DIAGNOSIS — Z510 Encounter for antineoplastic radiation therapy: Secondary | ICD-10-CM | POA: Insufficient documentation

## 2019-12-13 DIAGNOSIS — T451X5A Adverse effect of antineoplastic and immunosuppressive drugs, initial encounter: Secondary | ICD-10-CM | POA: Diagnosis not present

## 2019-12-13 NOTE — Patient Instructions (Signed)
You will need to lie on your back when flushing or putting liquids into the tube.

## 2019-12-13 NOTE — Progress Notes (Signed)
S/p robotic g tube. Has some drainage around tube No fevers or chills He is in a recliner while he does TF , educated  him about laying supine with right side down while down TF to help  No fevers  PE NAD Abd: soft, incision healing well. G tube in place, no infection , patent  A/p  G tube education regarding positioning after meals d/w the pt I do not see any infection or surgical issues related to g tube

## 2019-12-14 ENCOUNTER — Inpatient Hospital Stay: Payer: 59 | Attending: Oncology

## 2019-12-15 NOTE — Telephone Encounter (Signed)
Received fax that patient was not seen for requested dates of service.  There were no dates of service on the request for records.  I called and left a message for the medical records department as per phone instructions to please fax over patient's records related to referral.

## 2019-12-16 ENCOUNTER — Inpatient Hospital Stay: Payer: 59

## 2019-12-16 DIAGNOSIS — Z5112 Encounter for antineoplastic immunotherapy: Secondary | ICD-10-CM | POA: Diagnosis not present

## 2019-12-16 NOTE — Progress Notes (Signed)
Nutrition Follow-up:  Patient with stage IV esophageal cancer.  Patient receiving chemotherapy and planning to start radiation.   Left 2 messages on home phone and 1 message on wife's mobile voicemail.    Wife returned RD's call.  Patient is tolerating continuous pump feeding of [employer] 1.4 better than bolus feeding.  Reports that he is up to 46ml/hr.  Wife reports that he has gotten nauseated and stopped feeding for awhile and then gets back on the pump.  Wife has been giving ensure original mixed with water via syringe really slowly to give extra nutrition.  Gave ensure quickly via syringe and vomited right after.  Wife has been giving additional water flush of 180 ml TID and with medications.  Reports bowels are moving better about every other day now.  Still having some issues with nausea.      Medications: reglan, zyprexa, ondanstron, protonix  Labs: reviewed  Anthropometrics:   Weight 287 lb on 11/1 at Dr Corlis Leak office  257 on 10/28 247 lb on 10/26 267 lb on 10/15   Estimated Energy Needs  Kcals: 2600-3000 Protein: 130-150 g Fluid: > 2.6 L  NUTRITION DIAGNOSIS: Inadequate oral intake continues relying on feeding tube for nutrition   INTERVENTION:  Continue continuous feeding of [employer] 1.4 and wife to increase to 90-188ml/hr tonight and if doing ok tomorrow will increase to goal rate of 120 ml/hr for 20 hours (7 cartons per day). Discussed option of providing syringe feeding of [employer] 1.4 vs ensure original (more nutrition in [place]). Wife can do syringe feeding (extremely slow) and continuous to get goal of cartons in per day.  Wife verbalized understanding.  Continue bowel regimen and antinausea medications.     MONITORING, EVALUATION, GOAL: weight trends, tube feeding tolerance   NEXT VISIT: Nov 11 f/u after pump removal  Ilhan Debenedetto B. Zenia Resides, [place], [place] Registered Dietitian [phone] (mobile)

## 2019-12-19 ENCOUNTER — Other Ambulatory Visit: Payer: Self-pay

## 2019-12-19 ENCOUNTER — Inpatient Hospital Stay: Payer: 59

## 2019-12-19 ENCOUNTER — Inpatient Hospital Stay
Admission: EM | Admit: 2019-12-19 | Discharge: 2019-12-27 | DRG: 682 | Disposition: A | Discharge status: Skilled Nursing Facility | Payer: 59 | Attending: Internal Medicine | Admitting: Internal Medicine

## 2019-12-19 ENCOUNTER — Emergency Department: Payer: 59

## 2019-12-19 ENCOUNTER — Encounter: Payer: Self-pay | Admitting: Family Medicine

## 2019-12-19 DIAGNOSIS — R112 Nausea with vomiting, unspecified: Secondary | ICD-10-CM | POA: Diagnosis present

## 2019-12-19 DIAGNOSIS — C787 Secondary malignant neoplasm of liver and intrahepatic bile duct: Secondary | ICD-10-CM | POA: Diagnosis present

## 2019-12-19 DIAGNOSIS — F32A Depression, unspecified: Secondary | ICD-10-CM | POA: Diagnosis present

## 2019-12-19 DIAGNOSIS — C7951 Secondary malignant neoplasm of bone: Secondary | ICD-10-CM | POA: Diagnosis present

## 2019-12-19 DIAGNOSIS — D696 Thrombocytopenia, unspecified: Secondary | ICD-10-CM

## 2019-12-19 DIAGNOSIS — C158 Malignant neoplasm of overlapping sites of esophagus: Secondary | ICD-10-CM

## 2019-12-19 DIAGNOSIS — E861 Hypovolemia: Secondary | ICD-10-CM | POA: Diagnosis present

## 2019-12-19 DIAGNOSIS — C7802 Secondary malignant neoplasm of left lung: Secondary | ICD-10-CM | POA: Diagnosis present

## 2019-12-19 DIAGNOSIS — Z6833 Body mass index (BMI) 33.0-33.9, adult: Secondary | ICD-10-CM

## 2019-12-19 DIAGNOSIS — D649 Anemia, unspecified: Secondary | ICD-10-CM | POA: Diagnosis not present

## 2019-12-19 DIAGNOSIS — D61810 Antineoplastic chemotherapy induced pancytopenia: Secondary | ICD-10-CM | POA: Diagnosis present

## 2019-12-19 DIAGNOSIS — N1831 Chronic kidney disease, stage 3a: Secondary | ICD-10-CM | POA: Diagnosis present

## 2019-12-19 DIAGNOSIS — G893 Neoplasm related pain (acute) (chronic): Secondary | ICD-10-CM | POA: Diagnosis present

## 2019-12-19 DIAGNOSIS — E860 Dehydration: Secondary | ICD-10-CM | POA: Diagnosis present

## 2019-12-19 DIAGNOSIS — Z931 Gastrostomy status: Secondary | ICD-10-CM

## 2019-12-19 DIAGNOSIS — K219 Gastro-esophageal reflux disease without esophagitis: Secondary | ICD-10-CM | POA: Diagnosis present

## 2019-12-19 DIAGNOSIS — T451X5A Adverse effect of antineoplastic and immunosuppressive drugs, initial encounter: Secondary | ICD-10-CM | POA: Diagnosis present

## 2019-12-19 DIAGNOSIS — E876 Hypokalemia: Secondary | ICD-10-CM | POA: Diagnosis present

## 2019-12-19 DIAGNOSIS — Z515 Encounter for palliative care: Secondary | ICD-10-CM

## 2019-12-19 DIAGNOSIS — E871 Hypo-osmolality and hyponatremia: Secondary | ICD-10-CM | POA: Diagnosis present

## 2019-12-19 DIAGNOSIS — I129 Hypertensive chronic kidney disease with stage 1 through stage 4 chronic kidney disease, or unspecified chronic kidney disease: Secondary | ICD-10-CM | POA: Diagnosis present

## 2019-12-19 DIAGNOSIS — I248 Other forms of acute ischemic heart disease: Secondary | ICD-10-CM | POA: Diagnosis present

## 2019-12-19 DIAGNOSIS — E440 Moderate protein-calorie malnutrition: Secondary | ICD-10-CM | POA: Diagnosis present

## 2019-12-19 DIAGNOSIS — R531 Weakness: Secondary | ICD-10-CM | POA: Diagnosis present

## 2019-12-19 DIAGNOSIS — D709 Neutropenia, unspecified: Secondary | ICD-10-CM

## 2019-12-19 DIAGNOSIS — C7801 Secondary malignant neoplasm of right lung: Secondary | ICD-10-CM | POA: Diagnosis present

## 2019-12-19 DIAGNOSIS — Z87891 Personal history of nicotine dependence: Secondary | ICD-10-CM

## 2019-12-19 DIAGNOSIS — Z20822 Contact with and (suspected) exposure to covid-19: Secondary | ICD-10-CM | POA: Diagnosis present

## 2019-12-19 DIAGNOSIS — Z833 Family history of diabetes mellitus: Secondary | ICD-10-CM

## 2019-12-19 DIAGNOSIS — C159 Malignant neoplasm of esophagus, unspecified: Secondary | ICD-10-CM | POA: Diagnosis not present

## 2019-12-19 DIAGNOSIS — M160 Bilateral primary osteoarthritis of hip: Secondary | ICD-10-CM | POA: Diagnosis present

## 2019-12-19 DIAGNOSIS — N179 Acute kidney failure, unspecified: Principal | ICD-10-CM

## 2019-12-19 DIAGNOSIS — M25559 Pain in unspecified hip: Secondary | ICD-10-CM

## 2019-12-19 DIAGNOSIS — Z79899 Other long term (current) drug therapy: Secondary | ICD-10-CM

## 2019-12-19 DIAGNOSIS — D701 Agranulocytosis secondary to cancer chemotherapy: Secondary | ICD-10-CM | POA: Diagnosis present

## 2019-12-19 DIAGNOSIS — Z8249 Family history of ischemic heart disease and other diseases of the circulatory system: Secondary | ICD-10-CM

## 2019-12-19 LAB — CBC WITH DIFFERENTIAL/PLATELET
Abs Immature Granulocytes: 0 10*3/uL (ref 0.00–0.07)
Abs Immature Granulocytes: 0.01 10*3/uL (ref 0.00–0.07)
Basophils Absolute: 0 10*3/uL (ref 0.0–0.1)
Basophils Absolute: 0 10*3/uL (ref 0.0–0.1)
Basophils Relative: 1 %
Basophils Relative: 1 %
Eosinophils Absolute: 0 10*3/uL (ref 0.0–0.5)
Eosinophils Absolute: 0 10*3/uL (ref 0.0–0.5)
Eosinophils Relative: 1 %
Eosinophils Relative: 1 %
HCT: 34.3 % — ABNORMAL LOW (ref 39.0–52.0)
HCT: 33.6 % — ABNORMAL LOW (ref 39.0–52.0)
Hemoglobin: 11.4 g/dL — ABNORMAL LOW (ref 13.0–17.0)
Hemoglobin: 10.7 g/dL — ABNORMAL LOW (ref 13.0–17.0)
Immature Granulocytes: 0 %
Immature Granulocytes: 1 %
Lymphocytes Relative: 43 %
Lymphocytes Relative: 43 %
Lymphs Abs: 0.4 10*3/uL — ABNORMAL LOW (ref 0.7–4.0)
Lymphs Abs: 0.5 10*3/uL — ABNORMAL LOW (ref 0.7–4.0)
MCH: 31.1 pg (ref 26.0–34.0)
MCH: 30.3 pg (ref 26.0–34.0)
MCHC: 33.2 g/dL (ref 30.0–36.0)
MCHC: 31.8 g/dL (ref 30.0–36.0)
MCV: 93.5 fL (ref 80.0–100.0)
MCV: 95.2 fL (ref 80.0–100.0)
Monocytes Absolute: 0.4 10*3/uL (ref 0.1–1.0)
Monocytes Absolute: 0.5 10*3/uL (ref 0.1–1.0)
Monocytes Relative: 41 %
Monocytes Relative: 41 %
Neutro Abs: 0.1 10*3/uL — CL (ref 1.7–7.7)
Neutro Abs: 0.2 10*3/uL — CL (ref 1.7–7.7)
Neutrophils Relative %: 14 %
Neutrophils Relative %: 13 %
Platelets: 130 10*3/uL — ABNORMAL LOW (ref 150–400)
Platelets: 127 10*3/uL — ABNORMAL LOW (ref 150–400)
RBC: 3.67 MIL/uL — ABNORMAL LOW (ref 4.22–5.81)
RBC: 3.53 MIL/uL — ABNORMAL LOW (ref 4.22–5.81)
RDW: 14.5 % (ref 11.5–15.5)
RDW: 14.7 % (ref 11.5–15.5)
Smear Review: NORMAL
Smear Review: NORMAL
WBC: 1 10*3/uL — CL (ref 4.0–10.5)
WBC: 1.2 10*3/uL — CL (ref 4.0–10.5)
nRBC: 0 % (ref 0.0–0.2)
nRBC: 0 % (ref 0.0–0.2)

## 2019-12-19 LAB — CBC
HCT: 34.4 % — ABNORMAL LOW (ref 39.0–52.0)
HCT: 32.4 % — ABNORMAL LOW (ref 39.0–52.0)
Hemoglobin: 11.2 g/dL — ABNORMAL LOW (ref 13.0–17.0)
Hemoglobin: 10.7 g/dL — ABNORMAL LOW (ref 13.0–17.0)
MCH: 30.4 pg (ref 26.0–34.0)
MCH: 30.8 pg (ref 26.0–34.0)
MCHC: 32.6 g/dL (ref 30.0–36.0)
MCHC: 33 g/dL (ref 30.0–36.0)
MCV: 93.2 fL (ref 80.0–100.0)
MCV: 93.4 fL (ref 80.0–100.0)
Platelets: 120 10*3/uL — ABNORMAL LOW (ref 150–400)
Platelets: 131 10*3/uL — ABNORMAL LOW (ref 150–400)
RBC: 3.69 MIL/uL — ABNORMAL LOW (ref 4.22–5.81)
RBC: 3.47 MIL/uL — ABNORMAL LOW (ref 4.22–5.81)
RDW: 14.6 % (ref 11.5–15.5)
RDW: 14.6 % (ref 11.5–15.5)
WBC: 1 10*3/uL — CL (ref 4.0–10.5)
WBC: 1.2 10*3/uL — CL (ref 4.0–10.5)
nRBC: 0 % (ref 0.0–0.2)
nRBC: 0 % (ref 0.0–0.2)

## 2019-12-19 LAB — COMPREHENSIVE METABOLIC PANEL
ALT: 47 U/L — ABNORMAL HIGH (ref 0–44)
ALT: 44 U/L (ref 0–44)
AST: 39 U/L (ref 15–41)
AST: 37 U/L (ref 15–41)
Albumin: 2.7 g/dL — ABNORMAL LOW (ref 3.5–5.0)
Albumin: 2.5 g/dL — ABNORMAL LOW (ref 3.5–5.0)
Alkaline Phosphatase: 84 U/L (ref 38–126)
Alkaline Phosphatase: 78 U/L (ref 38–126)
Anion gap: 10 (ref 5–15)
Anion gap: 6 (ref 5–15)
BUN: 24 mg/dL — ABNORMAL HIGH (ref 6–20)
BUN: 23 mg/dL — ABNORMAL HIGH (ref 6–20)
CO2: 30 mmol/L (ref 22–32)
CO2: 32 mmol/L (ref 22–32)
Calcium: 9.8 mg/dL (ref 8.9–10.3)
Calcium: 9.6 mg/dL (ref 8.9–10.3)
Chloride: 91 mmol/L — ABNORMAL LOW (ref 98–111)
Chloride: 93 mmol/L — ABNORMAL LOW (ref 98–111)
Creatinine, Ser: 1.48 mg/dL — ABNORMAL HIGH (ref 0.61–1.24)
Creatinine, Ser: 1.44 mg/dL — ABNORMAL HIGH (ref 0.61–1.24)
GFR, Estimated: 54 mL/min — ABNORMAL LOW (ref >60)
GFR, Estimated: 56 mL/min — ABNORMAL LOW (ref >60)
Glucose, Bld: 116 mg/dL — ABNORMAL HIGH (ref 70–99)
Glucose, Bld: 132 mg/dL — ABNORMAL HIGH (ref 70–99)
Potassium: 4.4 mmol/L (ref 3.5–5.1)
Potassium: 4.2 mmol/L (ref 3.5–5.1)
Sodium: 131 mmol/L — ABNORMAL LOW (ref 135–145)
Sodium: 131 mmol/L — ABNORMAL LOW (ref 135–145)
Total Bilirubin: 1.1 mg/dL (ref 0.3–1.2)
Total Bilirubin: 1.1 mg/dL (ref 0.3–1.2)
Total Protein: 6.4 g/dL — ABNORMAL LOW (ref 6.5–8.1)
Total Protein: 6 g/dL — ABNORMAL LOW (ref 6.5–8.1)

## 2019-12-19 LAB — URINALYSIS, COMPLETE (UACMP) WITH MICROSCOPIC
Bilirubin Urine: NEGATIVE
Glucose, UA: NEGATIVE mg/dL
Hgb urine dipstick: NEGATIVE
Ketones, ur: NEGATIVE mg/dL
Leukocytes,Ua: NEGATIVE
Nitrite: NEGATIVE
Protein, ur: 30 mg/dL — AB
Specific Gravity, Urine: 1.024 (ref 1.005–1.030)
pH: 5 (ref 5.0–8.0)

## 2019-12-19 LAB — PROTIME-INR
INR: 1.3 — ABNORMAL HIGH (ref 0.8–1.2)
Prothrombin Time: 15.4 seconds — ABNORMAL HIGH (ref 11.4–15.2)

## 2019-12-19 LAB — RESPIRATORY PANEL BY RT PCR (FLU A&B, COVID)
Influenza A by PCR: NEGATIVE
Influenza B by PCR: NEGATIVE
SARS Coronavirus 2 by RT PCR: NEGATIVE

## 2019-12-19 LAB — TROPONIN I (HIGH SENSITIVITY)
Troponin I (High Sensitivity): 22 ng/L — ABNORMAL HIGH (ref <18)
Troponin I (High Sensitivity): 18 ng/L — ABNORMAL HIGH (ref <18)

## 2019-12-19 LAB — APTT: aPTT: 35 seconds (ref 24–36)

## 2019-12-19 LAB — HIV ANTIBODY (ROUTINE TESTING W REFLEX): HIV Screen 4th Generation wRfx: NONREACTIVE

## 2019-12-19 IMAGING — CR DG ABDOMEN 2V
1 series · 3 of 3 positions shown · non-contrast
Comparison: None.

CLINICAL DATA: Generalized weakness

EXAM:
ABDOMEN - 2 VIEW

[Series 1: dg abd 2 views · 0.14mm/px · 3 of 3 slices shown]
[im 1/3]
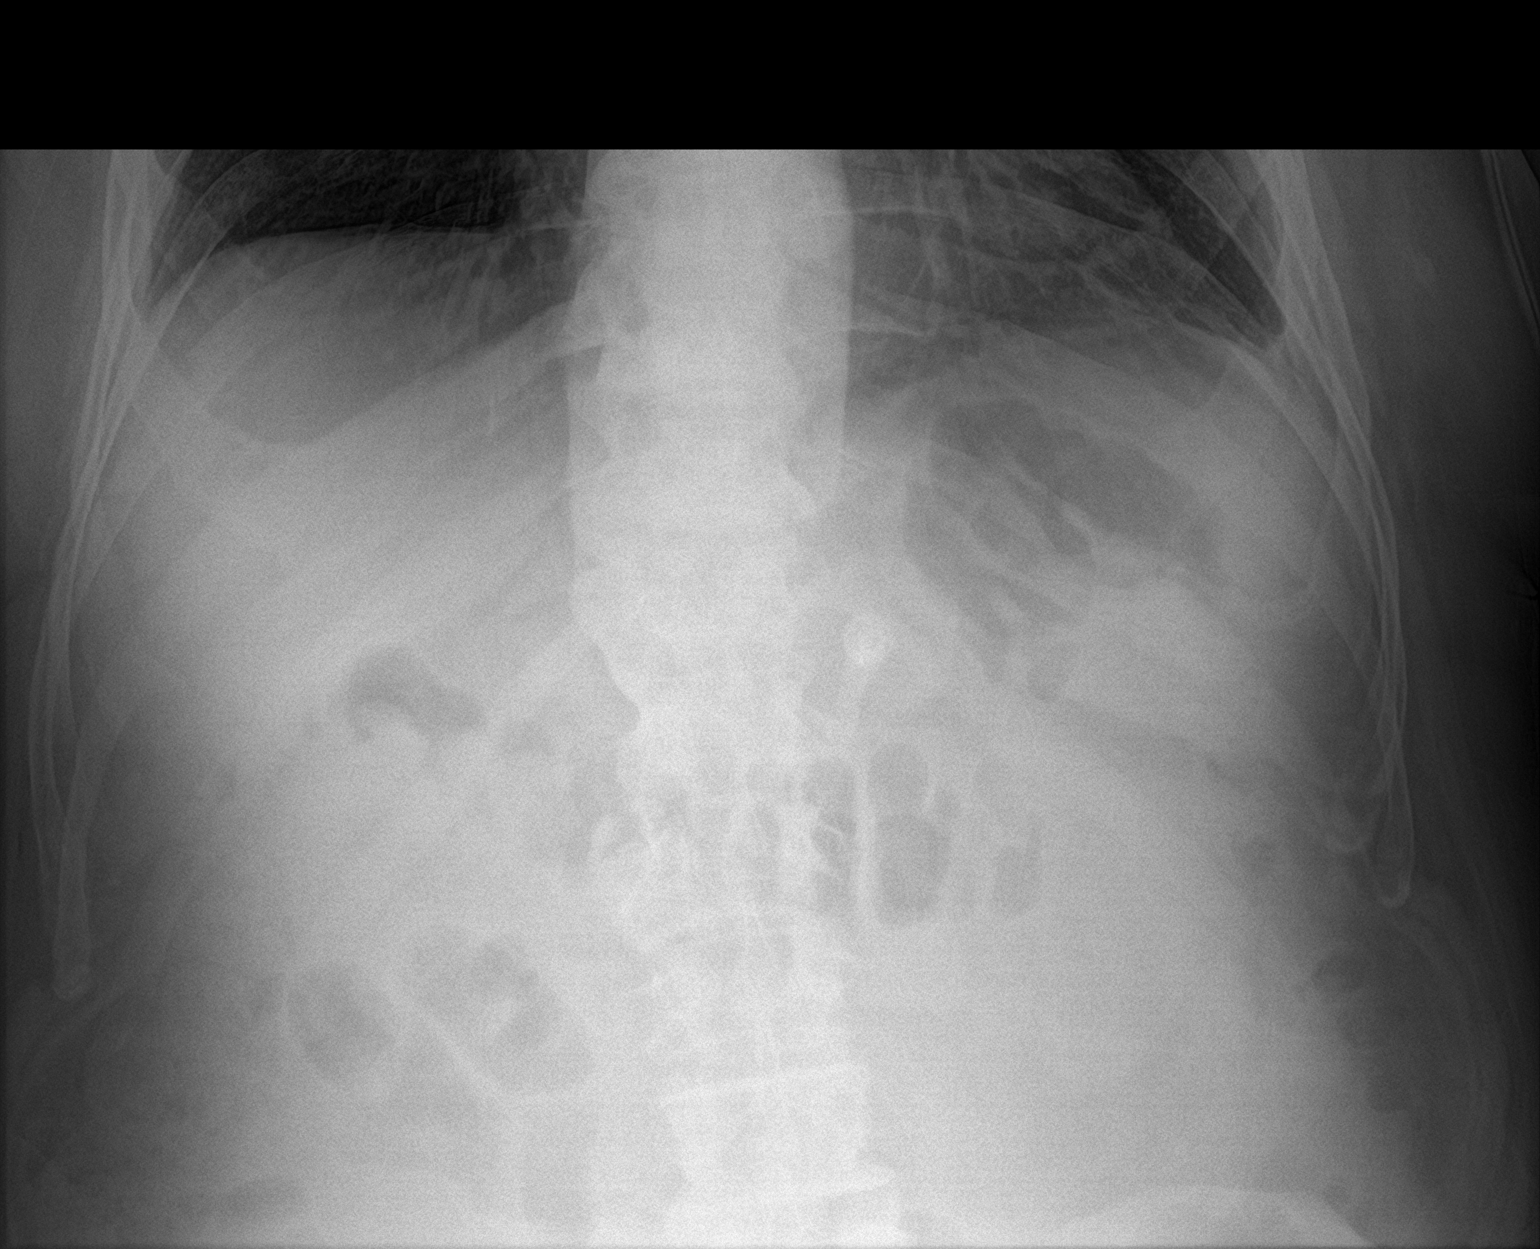
[im 2/3]
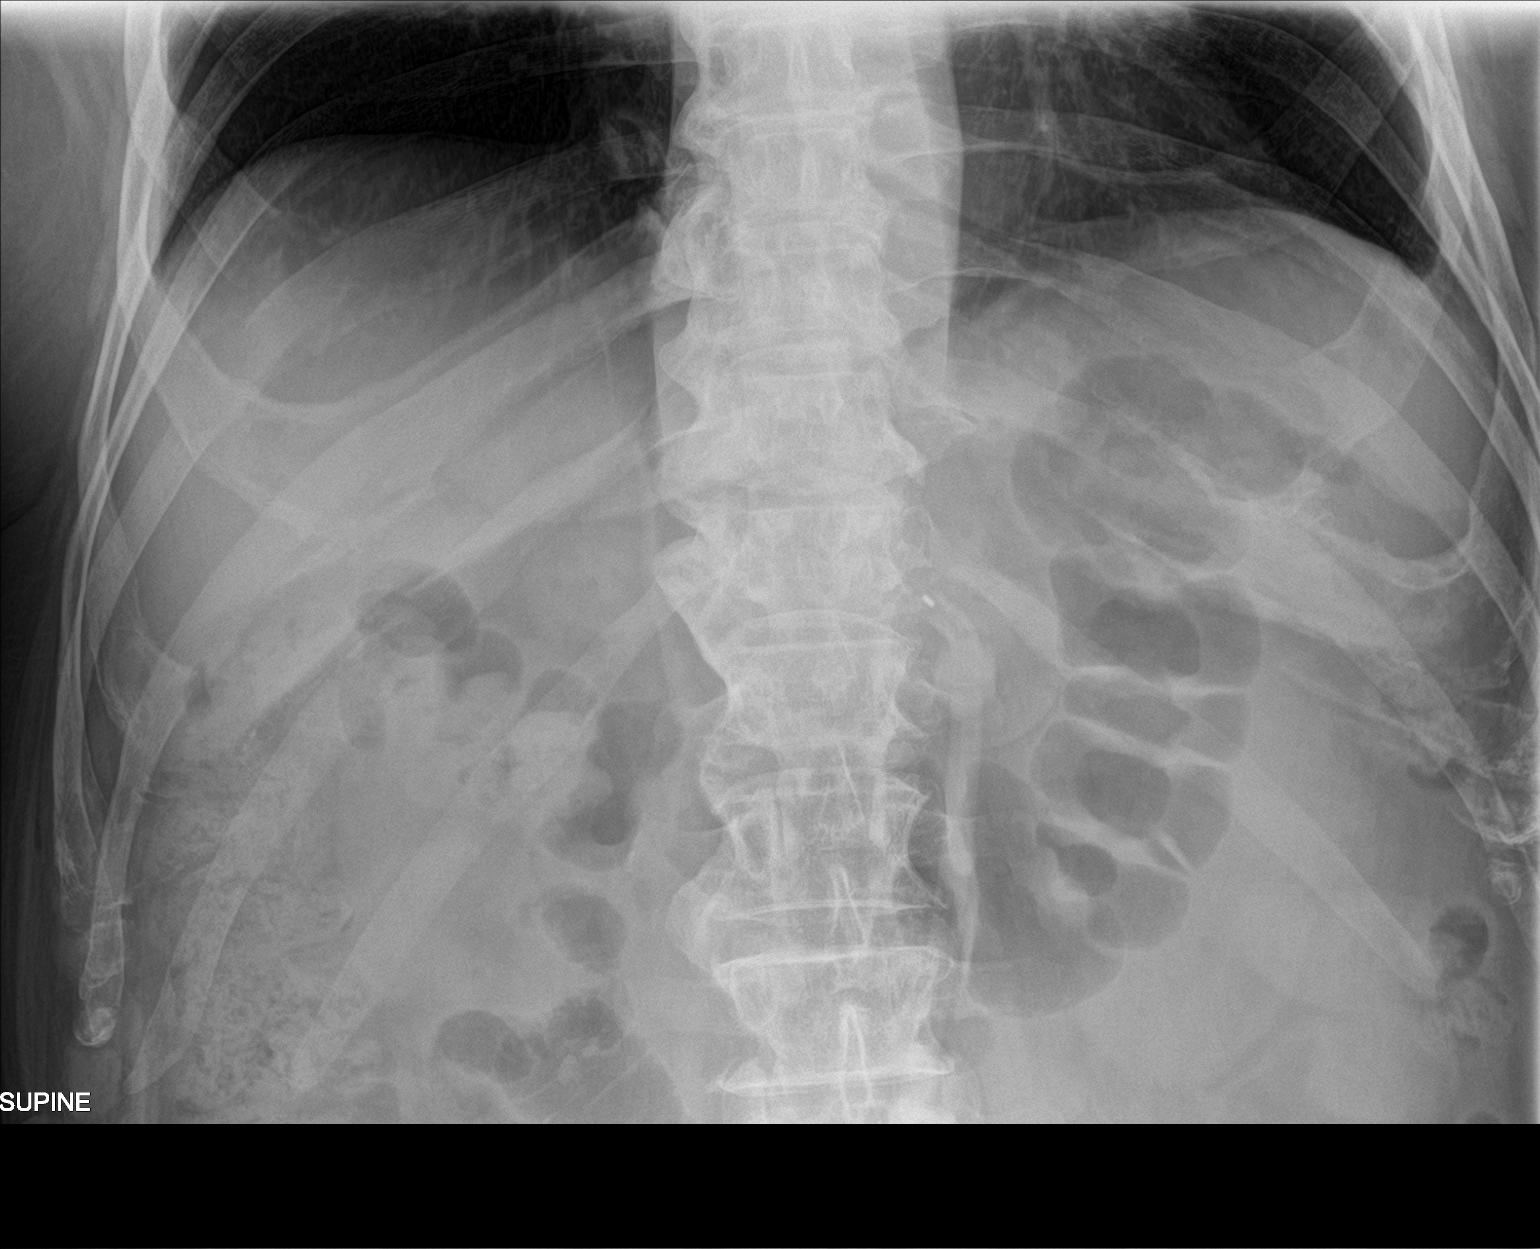
[im 3/3]
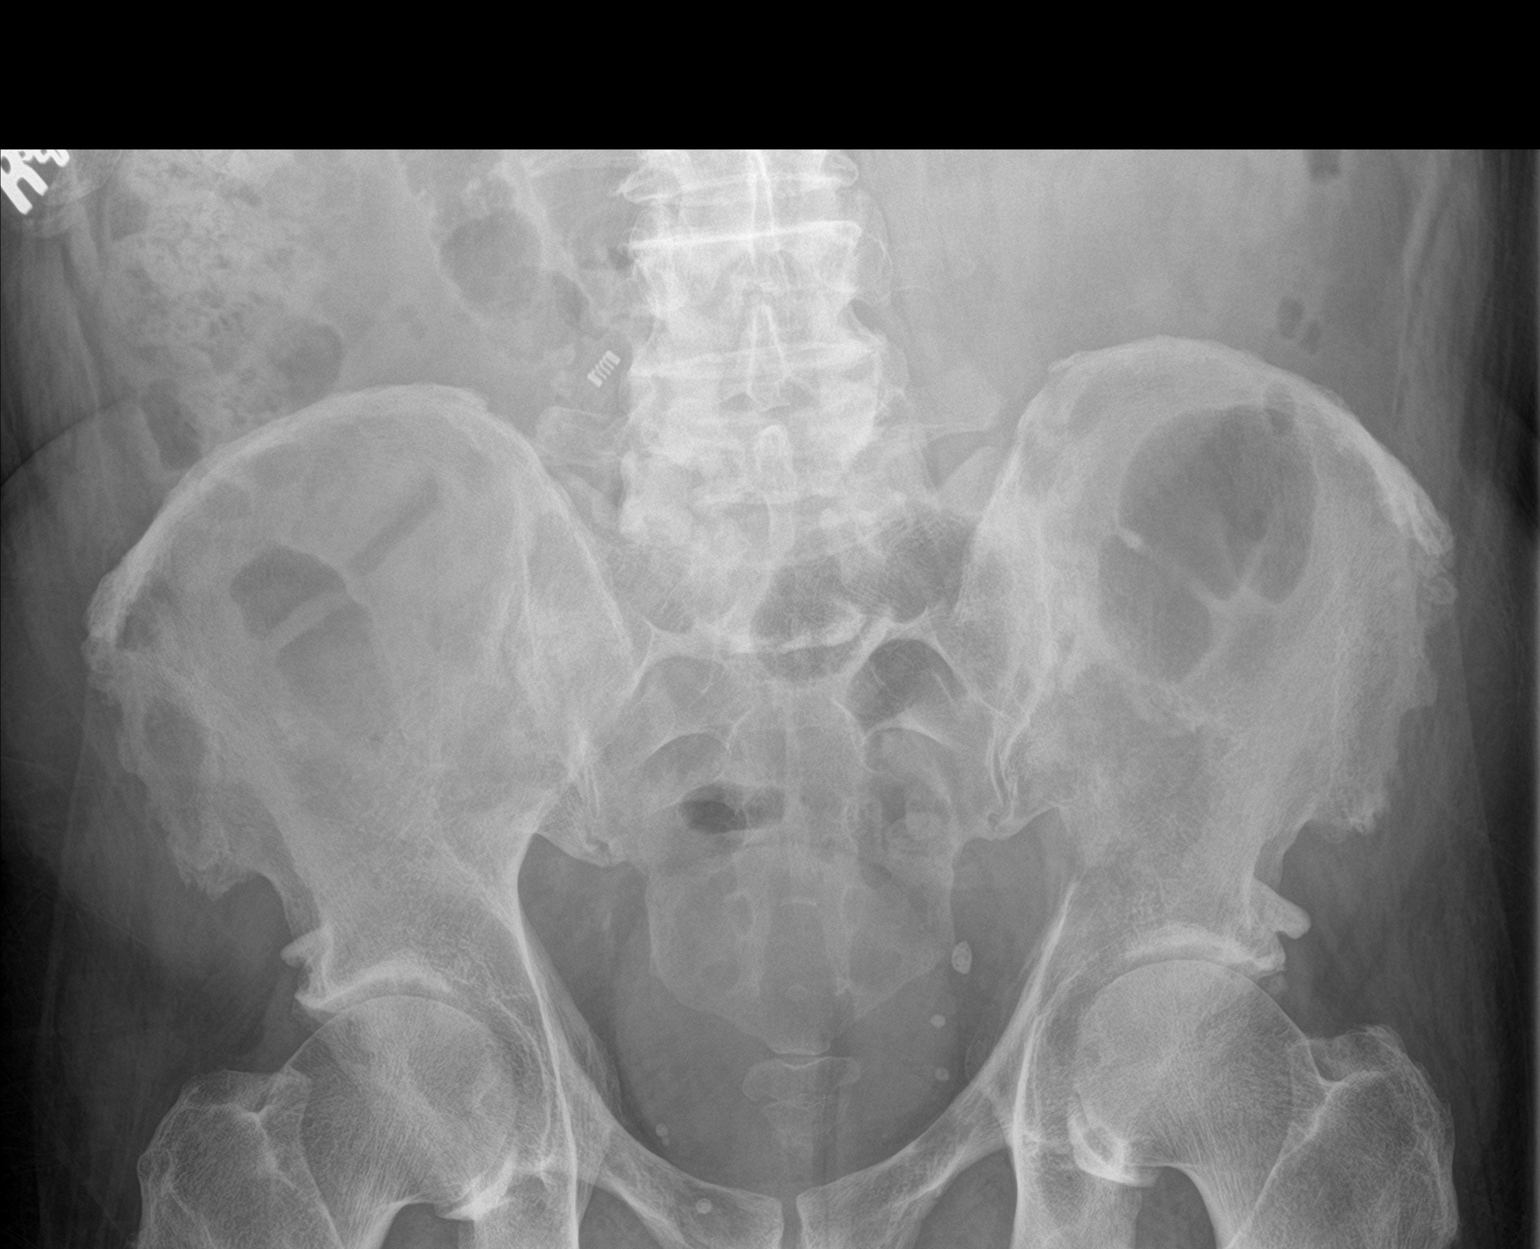

[3 of 3 positions shown; findings below may reference images not displayed]

FINDINGS: The bowel gas pattern is normal. Moderate amount of right colonic
stool is present. There is no evidence of free air. No radio-opaque
calculi or other significant radiographic abnormality is seen.
IMPRESSION: Nonobstructive bowel gas pattern.

## 2019-12-19 IMAGING — DX DG CHEST 1V PORT
1 series · 2 of 2 positions shown · non-contrast
Comparison: [DATE].  PET-CT dated [DATE]

CLINICAL DATA: Weakness.

EXAM:
PORTABLE CHEST 1 VIEW

[Series 1: chest ap · 0.14mm/px · 2 of 2 slices shown]
[im 1/2]
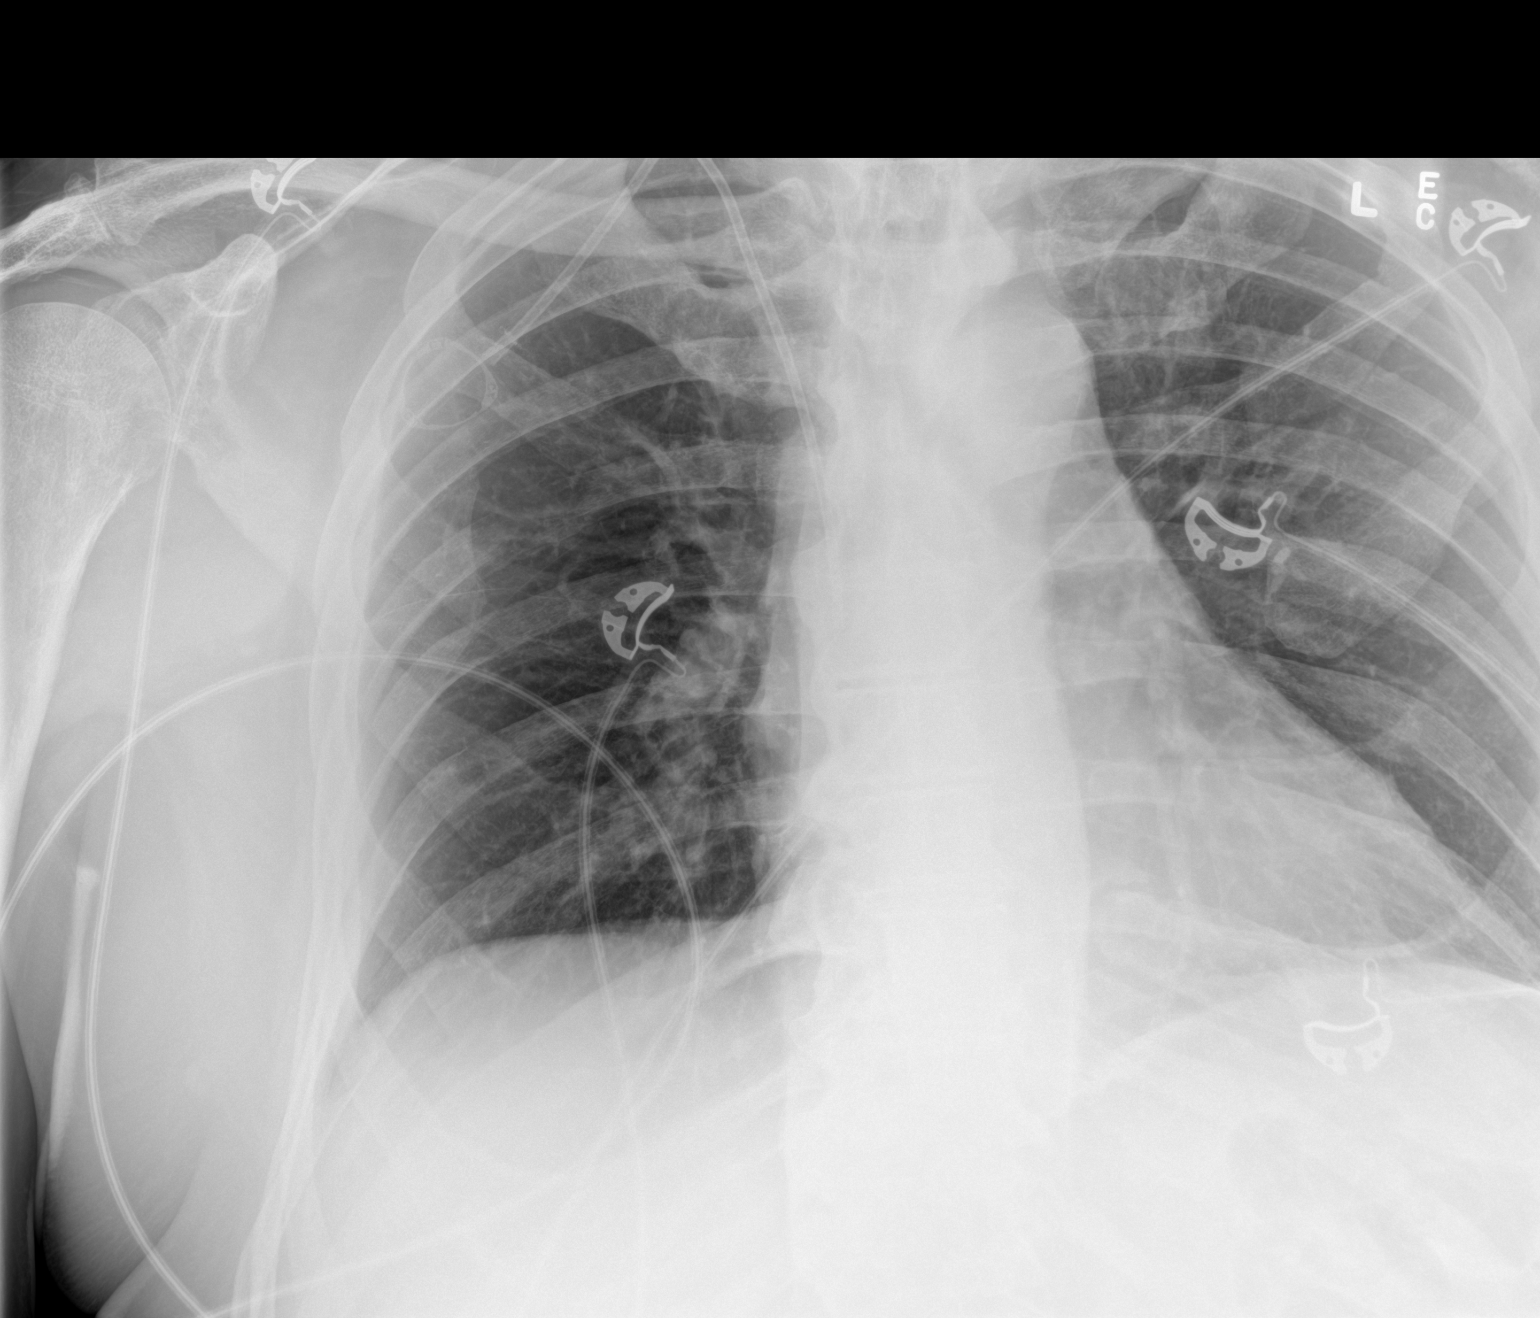
[im 2/2]
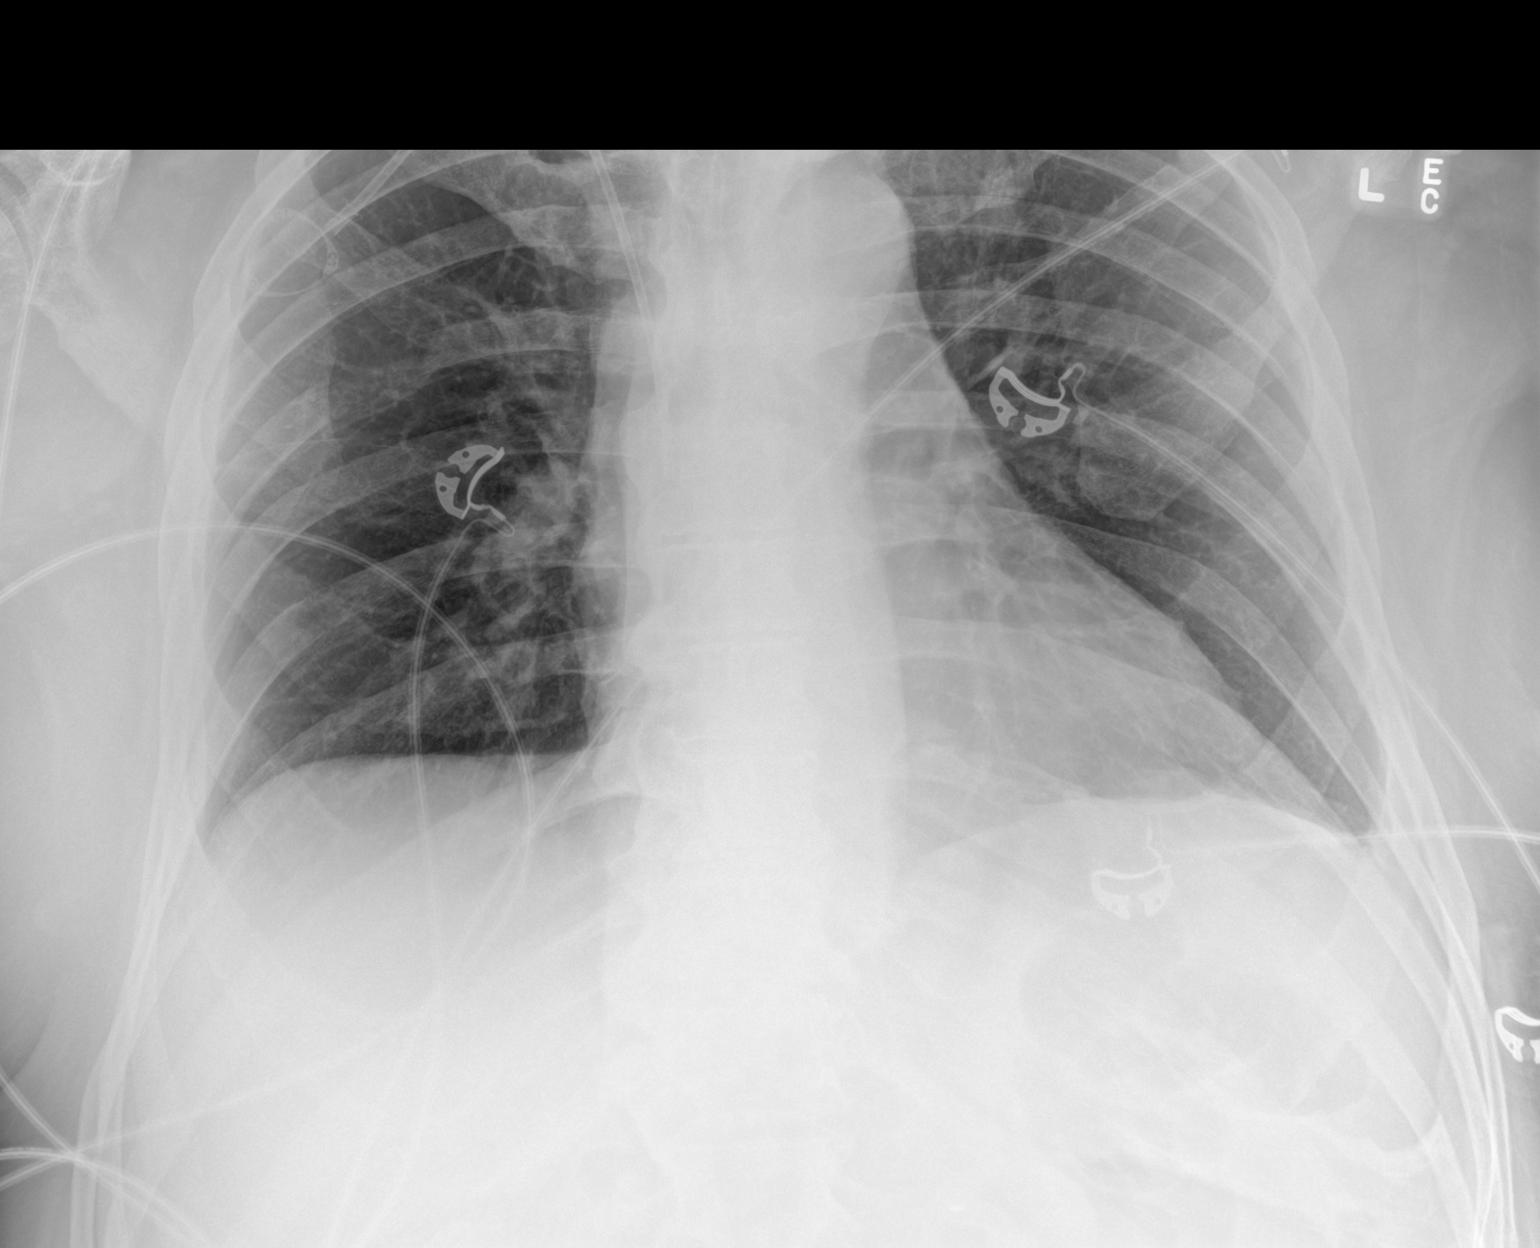

[2 of 2 positions shown; findings below may reference images not displayed]

FINDINGS: There is a well-positioned right-sided Port-A-Cath. Again noted is a
lytic lesion involving the right glenoid. There is no pneumothorax.
No large pleural effusion. There is some atelectasis at the lung
bases. There is a lytic lesion involving the posterior eighth rib on
the right. The heart size is unremarkable.
IMPRESSION: 1. No acute cardiopulmonary process.
2. Well-positioned right-sided Port-A-Cath.
3. Lytic lesion involving the right glenoid and right eighth rib.

## 2019-12-19 MED ORDER — ONDANSETRON HCL 4 MG/2ML IJ SOLN
4.0000 mg | Freq: Four times a day (QID) | INTRAMUSCULAR | Status: DC | PRN
  Administered 2019-12-24: 4 mg via INTRAVENOUS
  Filled 2019-12-19: qty 2

## 2019-12-19 MED ORDER — OSMOLITE 1.5 CAL PO LIQD
1000.0000 mL | ORAL | Status: DC
  Administered 2019-12-19 – 2019-12-24 (×3): 1000 mL

## 2019-12-19 MED ORDER — ACETAMINOPHEN 325 MG PO TABS
650.0000 mg | ORAL_TABLET | Freq: Four times a day (QID) | ORAL | Status: DC | PRN
  Administered 2019-12-24: 650 mg via ORAL
  Filled 2019-12-19: qty 2

## 2019-12-19 MED ORDER — ORAL CARE MOUTH RINSE
15.0000 mL | Freq: Two times a day (BID) | OROMUCOSAL | Status: DC
  Administered 2019-12-19 – 2019-12-27 (×5): 15 mL via OROMUCOSAL

## 2019-12-19 MED ORDER — CHLORHEXIDINE GLUCONATE 0.12 % MT SOLN
15.0000 mL | Freq: Two times a day (BID) | OROMUCOSAL | Status: DC
  Administered 2019-12-19 – 2019-12-27 (×11): 15 mL via OROMUCOSAL
  Filled 2019-12-19 (×14): qty 15

## 2019-12-19 MED ORDER — ONDANSETRON HCL 4 MG PO TABS
4.0000 mg | ORAL_TABLET | Freq: Four times a day (QID) | ORAL | Status: DC | PRN
  Administered 2019-12-23 (×2): 4 mg via ORAL
  Filled 2019-12-19 (×2): qty 1

## 2019-12-19 MED ORDER — METOCLOPRAMIDE HCL 5 MG/5ML PO SOLN
5.0000 mg | Freq: Three times a day (TID) | ORAL | Status: DC
  Administered 2019-12-19 – 2019-12-24 (×14): 5 mg via ORAL
  Filled 2019-12-19 (×18): qty 10

## 2019-12-19 MED ORDER — PANTOPRAZOLE SODIUM 40 MG PO TBEC
40.0000 mg | DELAYED_RELEASE_TABLET | Freq: Two times a day (BID) | ORAL | Status: DC
  Administered 2019-12-19 – 2019-12-27 (×17): 40 mg via ORAL
  Filled 2019-12-19 (×17): qty 1

## 2019-12-19 MED ORDER — PROSOURCE TF PO LIQD
45.0000 mL | Freq: Two times a day (BID) | ORAL | Status: DC
  Administered 2019-12-20 – 2019-12-24 (×9): 45 mL
  Filled 2019-12-19: qty 45

## 2019-12-19 MED ORDER — ONDANSETRON HCL 4 MG/2ML IJ SOLN
4.0000 mg | Freq: Once | INTRAMUSCULAR | Status: AC
  Administered 2019-12-19: 4 mg via INTRAVENOUS
  Filled 2019-12-19: qty 2

## 2019-12-19 MED ORDER — MORPHINE SULFATE (PF) 4 MG/ML IV SOLN
4.0000 mg | Freq: Once | INTRAVENOUS | Status: AC
  Administered 2019-12-19: 4 mg via INTRAVENOUS
  Filled 2019-12-19: qty 1

## 2019-12-19 MED ORDER — LIDOCAINE-PRILOCAINE 2.5-2.5 % EX CREA
1.0000 "application " | TOPICAL_CREAM | Freq: Once | CUTANEOUS | Status: DC
  Filled 2019-12-19: qty 5

## 2019-12-19 MED ORDER — OXYCODONE HCL 5 MG/5ML PO SOLN
5.0000 mg | ORAL | Status: DC | PRN
  Administered 2019-12-19 – 2019-12-27 (×16): 5 mg via ORAL
  Filled 2019-12-19 (×16): qty 5

## 2019-12-19 MED ORDER — SODIUM CHLORIDE 0.9 % IV BOLUS
1000.0000 mL | Freq: Once | INTRAVENOUS | Status: AC
  Administered 2019-12-19: 1000 mL via INTRAVENOUS

## 2019-12-19 MED ORDER — ONDANSETRON 8 MG PO TBDP
8.0000 mg | ORAL_TABLET | Freq: Three times a day (TID) | ORAL | Status: DC | PRN
  Filled 2019-12-19: qty 1

## 2019-12-19 MED ORDER — ENOXAPARIN SODIUM 80 MG/0.8ML ~~LOC~~ SOLN
65.0000 mg | SUBCUTANEOUS | Status: DC
  Administered 2019-12-19 – 2019-12-25 (×7): 65 mg via SUBCUTANEOUS
  Filled 2019-12-19 (×7): qty 0.8

## 2019-12-19 MED ORDER — MAGNESIUM HYDROXIDE 400 MG/5ML PO SUSP
30.0000 mL | Freq: Every day | ORAL | Status: DC | PRN
  Filled 2019-12-19: qty 30

## 2019-12-19 MED ORDER — CIPROFLOXACIN HCL 500 MG PO TABS
500.0000 mg | ORAL_TABLET | Freq: Two times a day (BID) | ORAL | Status: DC
  Administered 2019-12-19 – 2019-12-23 (×8): 500 mg via ORAL
  Filled 2019-12-19 (×8): qty 1

## 2019-12-19 MED ORDER — OLANZAPINE 10 MG PO TABS
10.0000 mg | ORAL_TABLET | Freq: Every day | ORAL | Status: DC
  Administered 2019-12-19 – 2019-12-26 (×8): 10 mg via ORAL
  Filled 2019-12-19 (×9): qty 1

## 2019-12-19 MED ORDER — FENTANYL 12 MCG/HR TD PT72
1.0000 | MEDICATED_PATCH | TRANSDERMAL | Status: DC
  Administered 2019-12-22 – 2019-12-25 (×2): 1 via TRANSDERMAL
  Filled 2019-12-19 (×2): qty 1

## 2019-12-19 MED ORDER — METOCLOPRAMIDE HCL 5 MG/5ML PO SOLN
5.0000 mg | Freq: Three times a day (TID) | ORAL | Status: DC
  Filled 2019-12-19: qty 5

## 2019-12-19 MED ORDER — TRAZODONE HCL 50 MG PO TABS
25.0000 mg | ORAL_TABLET | Freq: Every evening | ORAL | Status: DC | PRN
  Administered 2019-12-26: 25 mg via ORAL
  Filled 2019-12-19 (×2): qty 1

## 2019-12-19 MED ORDER — INFLUENZA VAC SPLIT QUAD 0.5 ML IM SUSY
0.5000 mL | PREFILLED_SYRINGE | INTRAMUSCULAR | Status: DC
  Filled 2019-12-19: qty 0.5

## 2019-12-19 MED ORDER — ACETAMINOPHEN 650 MG RE SUPP: 650.0000 mg | Freq: Four times a day (QID) | RECTAL | Status: DC | PRN

## 2019-12-19 MED ORDER — SENNOSIDES 8.8 MG/5ML PO SYRP
10.0000 mL | ORAL_SOLUTION | Freq: Every day | ORAL | Status: DC
  Administered 2019-12-20 – 2019-12-26 (×5): 10 mL via ORAL
  Filled 2019-12-19 (×10): qty 10

## 2019-12-19 MED ORDER — SODIUM CHLORIDE 0.9 % IV SOLN: INTRAVENOUS | Status: DC

## 2019-12-19 MED ORDER — FREE WATER
30.0000 mL | Status: DC
  Administered 2019-12-19 – 2019-12-20 (×5): 30 mL

## 2019-12-19 MED ORDER — MORPHINE SULFATE (PF) 2 MG/ML IV SOLN
2.0000 mg | INTRAVENOUS | Status: DC | PRN
  Administered 2019-12-24 (×2): 2 mg via INTRAVENOUS
  Filled 2019-12-19 (×2): qty 1

## 2019-12-19 NOTE — Progress Notes (Signed)
Ch visited with Pt as follow-up for AD completion. Pt's wife Arbie Cookey bedside. Ch asked about AD completion request, Arbie Cookey says that she has a completed copy that needs notarization which she will bring to the hospital tomorrow around lunch time. Ch will pass on AD notarization follow-up to other chaplains. Arbie Cookey seems anxious about Pt's prognosis. Pt wants to be cremated, and asked chaplain about what God thinks about choosing cremation over burial because of costs. Ch engaged with them about their decision. They requested prayer. Ch prayed with them. They were grateful for visit.

## 2019-12-19 NOTE — ED Notes (Signed)
Aat 0120 pt reported that nausea decreased and pain went down to 6/10.  At 0151 Critical value of WBC called to this RN and ER provider notified. WBC is 1

## 2019-12-19 NOTE — H&P (Signed)
[place]   PATIENT NAME: Jacob Henson    MR#:  [id number]  DATE OF BIRTH:  1960/03/07  DATE OF ADMISSION:  12/19/2019  PRIMARY CARE PHYSICIAN: Elby Beck, FNP   REQUESTING/REFERRING PHYSICIAN: Harvest Dark, MD  CHIEF COMPLAINT:   Chief Complaint  Patient presents with  . Weakness    HISTORY OF PRESENT ILLNESS:  Jacob Henson  is a 59 y.o. 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  Since yesterday the patient was not able to get out of bed.  He admitted to nausea and  vomiting without diarrhea or abdominal pain.  No bilious vomitus or hematemesis.  He has been having diminished appetite.  No chest pain or dyspnea or cough.  No dysuria, oliguria or hematuria, urgency or frequency or flank pain.  No bleeding diathesis.  Upon presentation to the emergency room, heart rate was 116 with a temperature of 99.1 and later heart rate is 120.  Labs revealed CBC with leukopenia 1 and neutropenia with ANC of 0.1.  Influenza antigens and COVID-19 PCR came back negative.  CMP revealed hyponatremia hypokalemia, BUN of 24 and creatinine 1.48 compared to 17/1.35 on 12/07/2019.  Albumin was 2.7 down from 3.4 then interval between 6.4 down from 7.1 then.  High-sensitivity troponin was 22 and later 18.  AST was 39 and ALT 47.Marland KitchenEKG showed sinus tachycardia with rate of 136 with premature ventricle complexes.  The patient was given following of IV morphine sulfate and following of IV Zofran as well as 1 L bolus of IV normal saline.  He will be admitted to a medical bed for further evaluation and management. PAST MEDICAL HISTORY:   Past Medical History:  Diagnosis Date  .  Allergy   . Cancer ([place])   . Esophageal cancer ([place])   . GERD (gastroesophageal reflux disease)    Has required esophageal dilation in past  . Hypertension     PAST SURGICAL HISTORY:   Past Surgical History:  Procedure Laterality Date  . ESOPHAGEAL DILATION    . PEG TUBE PLACEMENT  11/30/2019  . PORTA CATH INSERTION N/A 11/19/2019   Procedure: PORTA CATH INSERTION;  Surgeon: Algernon Huxley, MD;  Location: [facility];  Service: Cardiovascular;  Laterality: N/A;    SOCIAL HISTORY:   Social History   Tobacco Use  . Smoking status: Former Smoker    Packs/day: 1.00    Years: 4.00    Pack years: 4.00    Types: Cigarettes    Quit date: 10/14/1978    Years since quitting: 41.2  . Smokeless tobacco: Never Used  Substance Use Topics  . Alcohol use: No    FAMILY HISTORY:   Family History  Problem Relation Age of Onset  . Heart disease Father   . Hearing loss Father   . Diabetes Father   . Heart disease Paternal Grandmother   . Diabetes Paternal Grandmother   . Heart attack Paternal Grandfather     DRUG ALLERGIES:  No Known Allergies  REVIEW OF SYSTEMS:   As per history of present illness. All pertinent systems were reviewed above. Constitutional, HEENT, cardiovascular, respiratory, GI, GU, musculoskeletal, neuro, psychiatric, endocrine, integumentary and hematologic  systems were reviewed and are otherwise negative/unremarkable except for positive findings mentioned above in the HPI.   MEDICATIONS AT HOME:   Prior to Admission medications   Medication Sig Start Date End Date Taking? Authorizing Provider  fentaNYL (DURAGESIC) 12 MCG/HR Place 1 patch onto the skin every 3 (three) days.   Yes [provider]  lidocaine-prilocaine (EMLA) cream Apply to affected area once Patient taking differently: Apply 1 application topically once. Apply to affected area once 11/18/19  Yes Sindy Guadeloupe, MD  metoCLOPramide (REGLAN) 5 MG/5ML solution Take 5 mLs (5 mg  total) by mouth 3 (three) times daily before meals. 12/09/19  Yes Sindy Guadeloupe, MD  OLANZapine (ZYPREXA) 10 MG tablet Take 1 tablet (10 mg total) by mouth at bedtime. 11/15/19  Yes Sindy Guadeloupe, MD  ondansetron (ZOFRAN-ODT) 8 MG disintegrating tablet Take 1 tablet (8 mg total) by mouth every 8 (eight) hours as needed for nausea or vomiting. 11/22/19  Yes Sindy Guadeloupe, MD  oxyCODONE (ROXICODONE) 5 MG/5ML solution Take 5 mLs (5 mg total) by mouth every 4 (four) hours as needed for severe pain. 12/09/19  Yes Sindy Guadeloupe, MD  pantoprazole (PROTONIX) 40 MG tablet Take 40 mg by mouth 2 (two) times daily.  10/13/19  Yes [provider]  sennosides (SENOKOT) 8.8 MG/5ML syrup Take 10 mLs by mouth at bedtime. Through peg tube, and hold if diarrhea. 12/07/19  Yes Sindy Guadeloupe, MD  oxyCODONE-acetaminophen (PERCOCET) 5-325 MG tablet Take 1-2 tablets by mouth every 4 (four) hours as needed for severe pain. Patient not taking: Reported on 12/19/2019 11/30/19 11/29/20  Jules Husbands, MD  sucralfate (CARAFATE) 1 GM/10ML suspension Take 1 g by mouth 4 (four) times daily as needed (stomach pain).  Patient not taking: Reported on 12/19/2019 10/29/19   [provider]      VITAL SIGNS:  Blood pressure 101/68, pulse (!) 121, temperature 99.1 F (37.3 C), temperature source Oral, resp. rate 14, height 6\' 5"  (1.956 m), weight 129.3 kg, SpO2 96 %.  PHYSICAL EXAMINATION:  Physical Exam  GENERAL:  59 y.o.-year-old Caucasian male patient lying in the bed with no acute distress.  EYES: Pupils equal, round, reactive to light and accommodation. No scleral icterus. Extraocular muscles intact.  HEENT: Head atraumatic, normocephalic. Oropharynx and nasopharynx clear.  NECK:  Supple, no jugular venous distention. No thyroid enlargement, no tenderness.  LUNGS: Normal breath sounds bilaterally, no wheezing, rales,rhonchi or crepitation. No use of accessory muscles of respiration.  CARDIOVASCULAR: Regular  rate and rhythm, S1, S2 normal. No murmurs, rubs, or gallops.  ABDOMEN: Soft, nondistended, nontender. Bowel sounds present. No organomegaly or mass.  EXTREMITIES: No pedal edema, cyanosis, or clubbing.  NEUROLOGIC: Cranial nerves II through XII are intact. Muscle strength 5/5 in all extremities. Sensation intact. Gait not checked.  PSYCHIATRIC: The patient is alert and oriented x 3.  Normal affect and good eye contact. SKIN: No obvious rash, lesion, or ulcer.   LABORATORY PANEL:   CBC Recent Labs  Lab 12/19/19 0150  WBC 1.0*  HGB 11.4*  HCT 34.3*  PLT 130*   ------------------------------------------------------------------------------------------------------------------  Chemistries  Recent Labs  Lab 12/19/19 0145  NA 131*  K 4.4  CL 91*  CO2 30  GLUCOSE 116*  BUN 24*  CREATININE 1.48*  CALCIUM 9.8  AST 39  ALT 47*  ALKPHOS 84  BILITOT 1.1   ------------------------------------------------------------------------------------------------------------------  Cardiac Enzymes No results for input(s): TROPONINI in the last 168 hours. ------------------------------------------------------------------------------------------------------------------  RADIOLOGY:  DG Chest Portable 1 View  Result Date: 12/19/2019 CLINICAL DATA:  Weakness. EXAM: PORTABLE CHEST 1 VIEW COMPARISON:  October 10, 2018.  PET-CT dated 11/23/2019 FINDINGS: There is a well-positioned right-sided Port-A-Cath. Again noted is a lytic lesion involving the right glenoid. There is no pneumothorax. No large pleural effusion. There is some atelectasis at the lung bases. There is a lytic lesion involving the posterior eighth rib on the right. The heart size is unremarkable. IMPRESSION: 1. No acute cardiopulmonary process. 2. Well-positioned right-sided Port-A-Cath. 3. Lytic lesion involving the right glenoid and right eighth rib. Electronically Signed   By: Constance Holster M.D.   On: 12/19/2019 02:37       IMPRESSION AND PLAN:   1.  Generalized weakness likely secondary to chemotherapy as well as mild acute kidney injury and dehydration. -The patient will be admitted to a medical bed. -He will be hydrated with IV normal saline and will follow BMP. -We will obtain physical therapy consultation for assessment of his ambulation. -An oncology follow-up consultation will be obtained. -I notified Dr. Tasia Catchings about the patient.  2.  Neutropenia secondary to chemotherapy with low-grade fever. -We will monitor his ANC at this time and hold off on any antibiotics. -Oncology consultation will be obtained as mentioned above.  3.  Hyponatremia likely hypovolemic due to volume depletion and anorexia. -The patient will be hydrated with IV normal saline and will follow his BMP.  4.  Stage IV esophageal cancer on active radiotherapy and chemotherapy. -Pain management will be provided. -We will continue her Reglan.  5.  Depression. -Continue Zyprexa.  6.  GERD. -Continue PPI therapy and Carafate.  7.  DVT prophylaxis. -Subcutaneous Lovenox.   All the records are reviewed and case discussed with ED provider. The plan of care was discussed in details with the patient (and family). I answered all questions. The patient agreed to proceed with the above mentioned plan. Further management will depend upon hospital course.   CODE STATUS: Full code  Status is: Inpatient  Remains inpatient appropriate because:Ongoing diagnostic testing needed not appropriate for outpatient work up, Unsafe d/c plan, IV treatments appropriate due to intensity of illness or inability to take PO and Inpatient level of care appropriate due to severity of illness   Dispo: The patient is from: Home              Anticipated d/c is to: Home              Anticipated d/c date is: 2 days              Patient currently is not medically stable to d/c.   TOTAL TIME TAKING CARE OF THIS PATIENT: 77minutes.    Christel Mormon  M.D on 12/19/2019 at 4:40 AM  [facility]   From 7 PM-7 AM, contact night-coverage [email or web address]  CC: Primary care physician; Elby Beck, FNP

## 2019-12-19 NOTE — ED Notes (Signed)
Medications have not been verified by pharmacy. Pharmacy messaged through [facility] asking for medications to be verified.

## 2019-12-19 NOTE — ED Notes (Signed)
04:45 medications have not been verified by pharmacy at this time.

## 2019-12-19 NOTE — Progress Notes (Signed)
Ch attempted visit with Pt in response to OR for AD and visit. Pt asleep at this time. Ch will try again later.

## 2019-12-19 NOTE — ED Notes (Signed)
Pt attempting to get urine sample with hand held urinal at this time.

## 2019-12-19 NOTE — Progress Notes (Signed)
Anticoagulation monitoring(Lovenox):  59 yo male ordered Lovenox 40 mg Q24h  Filed Weights   12/19/19 0139  Weight: 129.3 kg (285 lb)   BMI 33.8   Lab Results  Component Value Date   CREATININE 1.44 (H) 12/19/2019   CREATININE 1.48 (H) 12/19/2019   CREATININE 1.35 (H) 12/07/2019   Estimated Creatinine Clearance: 82.2 mL/min (A) (by C-G formula based on SCr of 1.44 mg/dL (H)). Hemoglobin & Hematocrit     Component Value Date/Time   HGB 10.7 (L) 12/19/2019 0445   HCT 32.4 (L) 12/19/2019 0445     Per Protocol for Patient with estCrcl > 30 ml/min and BMI > 30, will transition to Lovenox 65 mg Q24h.

## 2019-12-19 NOTE — Evaluation (Signed)
Occupational Therapy Evaluation Patient Details Name: Jacob Henson MRN: [id number] DOB: Jun 15, 1960 Today's Date: 12/19/2019    History of Present Illness 59 y.o. male with PMH most significant for metastatic esophageal adenocarcinoma with bone metastasis, on active chemotherapy who presents to ER for progressive generalized weakness, nausea and vomiting after tube feeding   Clinical Impression   Mr Debroux was seen for OT evaluation this date. Pt reports ~ 1 week PTA requiring assist for tube feedings and sponge bathing seated on side of tub; limited household ambulation w/o AD. Pt lives c wife who works full time in 1 story home c 3 STE. Pt presents to acute OT demonstrating impaired ADL performance and functional mobility 2/2 functional strength/ROM/balance deficits, decreased activity tolerance, and pain limiting functional use of dominant RUE. Upon arrival pt reports difficulty using urinal in bed causing bed to be soiled - requesting assistance c cleanup. Pt requires MIN A for trunk sup>sit, MOD A for BLE sit>sup. Pt braced RLE against OT foot and used LUE to scoot laterally in small movements - no physical assist to scoot. MOD A + LUE on L rail - assit for lift off sit<>stand, anticipate decreased assist from elevated surface.   Pt reports mass impinging on RUE causing pain at rest and increased pain c mobility. AROM ~15* (flex/ext/abd/add) as needed for dressing/mobility. RN in room for skin check - notified pt would benefit from sling for comfort during mobility. MIN A don gown in sitting. MAX A don/doff B socks seated EOB and MOD A doff shorts in standing - assist for balance and pulling down past rear. Pt would benefit from skilled OT to address noted impairments and functional limitations (see below for any additional details) in order to maximize safety and independence while minimizing falls risk and caregiver burden. Upon hospital discharge, recommend STR to maximize pt safety and  return to PLOF.     Follow Up Recommendations  SNF (may progress)   Equipment Recommendations  Other (comment);3 in 1 bedside commode (TBD; BSC if d/c home)    Recommendations for Other Services       Precautions / Restrictions Precautions Precautions: Fall Restrictions Weight Bearing Restrictions: No      Mobility Bed Mobility Overal bed mobility: Needs Assistance Bed Mobility: Supine to Sit;Sit to Supine     Supine to sit: Min assist;HOB elevated Sit to supine: Mod assist   General bed mobility comments: MIN A for trunk sup>sit, MOD A for BLE sit>sup    Transfers Overall transfer level: Needs assistance Equipment used: 1 person hand held assist Transfers: Sit to/from Stand;Lateral/Scoot Transfers Sit to Stand: Mod assist        Lateral/Scoot Transfers: Min assist General transfer comment: Pt braced RLE against OT foot and used LUE to scoot laterally in small movements - no physical assist to scoot. MOD A + LUE only on L rail for lift off sit<>stand, anticipate decreased assist from elevated surface    Balance Overall balance assessment: Needs assistance Sitting-balance support: Single extremity supported;Feet supported Sitting balance-Leahy Scale: Good     Standing balance support: Single extremity supported Standing balance-Leahy Scale: Fair          ADL either performed or assessed with clinical judgement   ADL Overall ADL's : Needs assistance/impaired        General ADL Comments: MAX A don/doff B socks seated EOB and MOD A doff shorts in standing - assist for balance and pulling down past rear. MOD A for ADL t/f  Pertinent Vitals/Pain Pain Assessment: 0-10 Pain Score: 5  Pain Location: R shoulder Pain Descriptors / Indicators: Discomfort;Grimacing Pain Intervention(s): Limited activity within patient's tolerance;Monitored during session;Repositioned;Premedicated before session     Hand Dominance Right    Extremity/Trunk Assessment Upper Extremity Assessment Upper Extremity Assessment: RUE deficits/detail RUE Deficits / Details: Pt reports mass impinging on RUE causing pain at rest and increased pain c mobility. AROM ~15* (flex/ext/abd/add) as needed for dressing/mobility RUE: Unable to fully assess due to pain   Lower Extremity Assessment Lower Extremity Assessment: Generalized weakness       Communication Communication Communication: No difficulties   Cognition Arousal/Alertness: Awake/alert Behavior During Therapy: WFL for tasks assessed/performed Overall Cognitive Status: Within Functional Limits for tasks assessed        General Comments       Exercises Exercises: Other exercises Other Exercises Other Exercises: Pt educated re: OT role, DME recs, d/c recs, falls prevention, ECS, importance of mobility, safe t/f technique, pain mgmt, joint protection strategies Other Exercises: LBD, sup<>sit, sit<>stand, sitting/standing balance/tolerance, simulated grooming tasks   Shoulder Instructions      Home Living Family/patient expects to be discharged to:: Private residence Living Arrangements: Spouse/significant other Available Help at Discharge: Family;Available PRN/intermittently Type of Home: House Home Access: Stairs to enter [employer] of Steps: 3 Entrance Stairs-Rails: None Home Layout: One level     Bathroom Shower/Tub: Tub/shower unit             Prior Functioning/Environment Level of Independence: Independent with assistive device(s)        Comments: Prior to ~1 week ago pt was Independent for household mobility. Required assist for sponge bathing seated edge of tub.         OT Problem List: Decreased range of motion;Decreased activity tolerance;Impaired balance (sitting and/or standing);Decreased strength      OT Treatment/Interventions: Self-care/ADL training;Therapeutic exercise;Energy conservation;DME and/or AE instruction;Therapeutic  activities;Patient/family education;Balance training    OT Goals(Current goals can be found in the care plan section) Acute Rehab OT Goals Patient Stated Goal: To return home OT Goal Formulation: With patient Time For Goal Achievement: 01/02/20 Potential to Achieve Goals: Good ADL Goals Pt Will Perform Grooming: with modified independence;sitting Pt Will Perform Lower Body Dressing: with min guard assist;sit to/from stand (c LRAD PRN) Pt Will Transfer to Toilet: bedside commode;ambulating;with min guard assist (c LRAD PRN)  OT Frequency: Min 2X/week    AM-PAC OT "6 Clicks" Daily Activity     Outcome Measure Help from another person eating meals?: None (PEG) Help from another person taking care of personal grooming?: A Little Help from another person toileting, which includes using toliet, bedpan, or urinal?: A Lot Help from another person bathing (including washing, rinsing, drying)?: A Lot Help from another person to put on and taking off regular upper body clothing?: A Little Help from another person to put on and taking off regular lower body clothing?: A Lot 6 Click Score: 16   End of Session Nurse Communication: Mobility status  Activity Tolerance: Patient tolerated treatment well Patient left: in bed;with call bell/phone within reach;with nursing/sitter in room  OT Visit Diagnosis: Other abnormalities of gait and mobility (R26.89)                Time: 5027-7412 OT Time Calculation (min): 25 min Charges:  OT General Charges $OT Visit: 1 Visit OT Evaluation $OT Eval Moderate Complexity: 1 Mod OT Treatments $Self Care/Home Management : 8-22 mins  Dessie Coma, M.S. OTR/L  12/19/19, 3:57 PM  ascom [phone]

## 2019-12-19 NOTE — Progress Notes (Signed)
PROGRESS NOTE    Jacob Henson  MWN:[id number] DOB: 1960/10/21 DOA: 12/19/2019 PCP: Elby Beck, FNP     Brief Narrative:  Jacob Henson is a 59 y.o. 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  Since yesterday, the patient was not able to get out of bed.  He admitted to nausea and  vomiting without diarrhea or abdominal pain.  No bilious vomitus or hematemesis.  He has been having diminished appetite. Patient was admitted for weakness.   New events last 24 hours / Subjective: Continues to have generalized weakness, no longer having any vomiting. No abdominal pain. Wife at bedside states that he has not been able to tolerate full tube feedings at home due to his nausea and has had to stop his tube feedings on and off at home. Discussed SNF placement, as I suspect he will need on discharge.   Assessment & Plan:   Active Problems:   Weakness   Generalized weakness likely secondary to chemotherapy as well as mild acute kidney injury and dehydration -PT OT eval pending   Hyponatremia likely hypovolemic due to volume depletion and anorexia -IVF and monitor BMP   Stage IV esophageal cancer on active radiotherapy and chemotherapy -Oncology consulted -Tube feeding management per dietitian -Continue chronic pain medications  Pancytopenia -Likely secondary to chemotherapy.  Continue to monitor CBC and fevers, bleeding  CKD stage IIIa -Remains stable  Elevated troponin secondary to demand ischemia -Trend has been flat 22 --> 18  Depression -Continue Zyprexa   DVT prophylaxis: Lovenox   Code Status: Full Family Communication: Wife at bedside Disposition Plan:    Status is: Inpatient  Remains inpatient appropriate because:IV treatments appropriate due to intensity of illness or inability to take PO and Inpatient level of care appropriate due to severity of illness   Dispo: The patient is from: Home              Anticipated d/c is to: SNF              Anticipated d/c date is: 2 days              Patient currently is not medically stable to d/c. Remains very weak. Will need to resume tube feeding. Oncology consulted. PT OT eval pending.    Consultants:   Oncology  Procedures:   None   Antimicrobials:  Anti-infectives (From admission, onward)   None        Objective: Vitals:   12/19/19 0605 12/19/19 0630 12/19/19 0650 12/19/19 0655  BP:  107/65    Pulse:  (!) 111 (!) 124   Resp:    16  Temp:      TempSrc:      SpO2: 98% 97% 96% 96%  Weight:      Height:       No intake or output data in the 24 hours ending 12/19/19 0822 Filed Weights   12/19/19 0139  Weight: 129.3 kg    Examination:  General exam: Appears calm and comfortable, chronically ill-appearing Respiratory system: Clear to auscultation. Respiratory effort normal. No respiratory distress. No conversational dyspnea.  Cardiovascular system: S1 & S2 heard, tachycardic, regular rhythm. No murmurs. No pedal edema. Gastrointestinal system:  Abdomen is nondistended, soft and nontender. Normal bowel sounds heard.  PEG tube in place Central nervous system: Alert and oriented. No focal neurological deficits. Speech clear.  Extremities: Symmetric in appearance  Skin: No rashes, lesions or ulcers on exposed skin  Psychiatry: Judgement and insight appear normal. Mood & affect appropriate.   Data Reviewed: I have personally reviewed following labs and imaging studies  CBC: Recent Labs  Lab 12/19/19 0145 12/19/19 0150 12/19/19 0445  WBC 1.0* 1.0* 1.2*  NEUTROABS  --  0.1*  --   HGB 11.2* 11.4* 10.7*  HCT 34.4* 34.3* 32.4*  MCV 93.2 93.5 93.4  PLT 120* 130* 131*    Basic Metabolic Panel: Recent Labs  Lab 12/19/19 0145 12/19/19 0445  NA 131* 131*  K 4.4 4.2  CL 91* 93*  CO2 30 32  GLUCOSE 116* 132*  BUN 24* 23*  CREATININE 1.48* 1.44*  CALCIUM 9.8 9.6   GFR: Estimated Creatinine Clearance: 82.2 mL/min (A) (by C-G formula based on SCr of 1.44 mg/dL (H)). Liver Function Tests: Recent Labs  Lab 12/19/19 0145 12/19/19 0445  AST 39 37  ALT 47* 44  ALKPHOS 84 78  BILITOT 1.1 1.1  PROT 6.4* 6.0*  ALBUMIN 2.7* 2.5*   No results for input(s): LIPASE, AMYLASE in the last 168 hours. No results for input(s): AMMONIA in the last 168 hours. Coagulation Profile: Recent Labs  Lab 12/19/19 0445  INR 1.3*   Cardiac Enzymes: No results for input(s): CKTOTAL, CKMB, CKMBINDEX, TROPONINI in the last 168 hours. BNP (last 3 results) No results for input(s): PROBNP in the last 8760 hours. HbA1C: No results for input(s): HGBA1C in the last 72 hours. CBG: No results for input(s): GLUCAP in the last 168 hours. Lipid Profile: No results for input(s): CHOL, HDL, LDLCALC, TRIG, CHOLHDL, LDLDIRECT in the last 72 hours. Thyroid Function Tests: No results for input(s): TSH, T4TOTAL, FREET4, T3FREE, THYROIDAB in the last 72 hours. Anemia Panel: No results for input(s): VITAMINB12, FOLATE, FERRITIN, TIBC, IRON, RETICCTPCT in the last 72 hours. Sepsis Labs: No results for input(s): PROCALCITON, LATICACIDVEN in the last 168 hours.  Recent Results (from the past 240 hour(s))  Respiratory Panel by RT PCR (Flu A&B, Covid) - Nasopharyngeal Swab     Status: None   Collection Time: 12/19/19  1:50 AM   Specimen: Nasopharyngeal Swab  Result Value Ref Range Status   SARS Coronavirus 2 by RT PCR NEGATIVE NEGATIVE Final    Comment: (NOTE) SARS-CoV-2 target nucleic acids are NOT DETECTED.  The SARS-CoV-2 RNA is generally detectable in upper respiratoy specimens during the acute phase of infection. The lowest concentration of SARS-CoV-2 viral copies this assay  can detect is 131 copies/mL. A negative result does not preclude SARS-Cov-2 infection and should not be used as the sole basis for treatment or other patient management decisions. A negative result may occur with  improper specimen collection/handling, submission of specimen other than nasopharyngeal swab, presence of viral mutation(s) within the areas targeted by this assay, and inadequate number of viral copies (<131 copies/mL). A negative result must be combined with clinical observations, patient history, and epidemiological information. The expected result is Negative.  Fact Sheet for Patients:  PinkCheek.be  Fact Sheet for Healthcare Providers:  GravelBags.it  This test is no t yet approved or cleared by the [place] FDA and  has been authorized for detection and/or diagnosis of SARS-CoV-2 by FDA under an Emergency Use Authorization (EUA). This EUA will remain  in effect (meaning this  test can be used) for the duration of the COVID-19 declaration under Section 564(b)(1) of the Act, 21 U.S.C. section 360bbb-3(b)(1), unless the authorization is terminated or revoked sooner.     Influenza A by PCR NEGATIVE NEGATIVE Final   Influenza B by PCR NEGATIVE NEGATIVE Final    Comment: (NOTE) The Xpert Xpress SARS-CoV-2/FLU/RSV assay is intended as an aid in  the diagnosis of influenza from Nasopharyngeal swab specimens and  should not be used as a sole basis for treatment. Nasal washings and  aspirates are unacceptable for Xpert Xpress SARS-CoV-2/FLU/RSV  testing.  Fact Sheet for Patients: PinkCheek.be  Fact Sheet for Healthcare Providers: GravelBags.it  This test is not yet approved or cleared by the [place] FDA and  has been authorized for detection and/or diagnosis of SARS-CoV-2 by  FDA under an Emergency Use Authorization (EUA). This EUA will remain   in effect (meaning this test can be used) for the duration of the  Covid-19 declaration under Section 564(b)(1) of the Act, 21  U.S.C. section 360bbb-3(b)(1), unless the authorization is  terminated or revoked. Performed at [facility], [address], [place], [place] [address]       Radiology Studies: DG Chest Portable 1 View  Result Date: 12/19/2019 CLINICAL DATA:  Weakness. EXAM: PORTABLE CHEST 1 VIEW COMPARISON:  October 10, 2018.  PET-CT dated 11/23/2019 FINDINGS: There is a well-positioned right-sided Port-A-Cath. Again noted is a lytic lesion involving the right glenoid. There is no pneumothorax. No large pleural effusion. There is some atelectasis at the lung bases. There is a lytic lesion involving the posterior eighth rib on the right. The heart size is unremarkable. IMPRESSION: 1. No acute cardiopulmonary process. 2. Well-positioned right-sided Port-A-Cath. 3. Lytic lesion involving the right glenoid and right eighth rib. Electronically Signed   By: Constance Holster M.D.   On: 12/19/2019 02:37      Scheduled Meds: . enoxaparin (LOVENOX) injection  65 mg Subcutaneous Q24H  . fentaNYL  1 patch Transdermal Q72H  . lidocaine-prilocaine  1 application Topical Once  . metoCLOPramide  5 mg Oral TID AC  . OLANZapine  10 mg Oral QHS  . pantoprazole  40 mg Oral BID  . sennosides  10 mL Oral QHS   Continuous Infusions: . sodium chloride       LOS: 0 days      Time spent: 35 minutes   Dessa Phi, DO [facility] 12/19/2019, 8:22 AM   Available via Epic secure chat 7am-7pm After these hours, please refer to coverage provider listed on [email or web address]

## 2019-12-19 NOTE — ED Notes (Signed)
Date and time results received: 12/19/19 0322 (use smartphrase ".now" to insert current time)  Test: Absolute Neurtorphils Critical Value: 0.1  Name of Provider Notified: Dr. Kerman Passey  Orders Received? Or Actions Taken?: Provider notified.

## 2019-12-19 NOTE — Progress Notes (Signed)
Pharmacy Communication Note  Checked [place] CSRS 12/19/19  Confirmed pt's recent PTA meds include:    Fentanyl 12 mcg patch Oxycodone Tabs Oxycodone Soln  Renda Rolls, PharmD, [facility] 12/19/2019 8:59 AM

## 2019-12-19 NOTE — Progress Notes (Signed)
PT Cancellation Note  Patient Details Name: Jacob Henson MRN: [id number] DOB: 09/03/1960   Cancelled Treatment:    Reason Eval/Treat Not Completed: Medical issues which prohibited therapy. Patient has a headache and refuses PT today. He also states that he feels too weak today.    [address], [place], [place] DPT 12/19/2019, 1:10 PM

## 2019-12-19 NOTE — Plan of Care (Signed)
  Problem: Education: Goal: Knowledge of General Education information will improve Description Including pain rating scale, medication(s)/side effects and non-pharmacologic comfort measures Outcome: Progressing   Problem: Health Behavior/Discharge Planning: Goal: Ability to manage health-related needs will improve Outcome: Progressing   

## 2019-12-19 NOTE — Consult Note (Addendum)
Hematology/Oncology Consult note [facility] [phone] [phone]  Patient Care Team: Elby Beck, FNP as PCP - General (Nurse Practitioner) Clent Jacks, RN as Oncology Nurse Navigator Sindy Guadeloupe, MD as Consulting Physician (Oncology)   Name of the patient: Jacob Henson  [id number]  Apr 04, 1960   Date of visit: 12/19/19 REASON FOR COSULTATION:  Esophageal cancer, weakness on chemotherapy History of presenting illness-  59 y.o. male with PMH most significant for metastatic esophageal adenocarcinoma with bone metastasis, on active chemotherapy who presents to ER for progressive generalized weakness, nausea and vomiting after tube feeding Upon presentation to the emergency room, patient was found to be tachycardic with temperature of 99.1, labs showed leukopenia with total WBC 1, neutrophil 0.1.  Influenza and COVID-19 PCR came back negative.  Chemistry showed hyponatremia and hypokalemia,  Patient was admitted for further management.  Hematology oncology was consulted.  Patient has history of stage IV esophageal adenocarcinoma with lung and bone metastasis, primary oncologist is Dr.Rao, he is on palliative chemotherapy with FOLFOX, and immunotherapy, last treatment was on 12/07/2019.  Patient received long-acting G-CSF support with Udenyca on 12/09/2019. Patient reports having trouble with tube feeding, had nauseous and vomiting after feeding.  Mild cough with some greenish sputum.  Patient has severe dysphagia and there is plan for him to start palliative radiation tomorrow. Denies any fever, chills, abdominal pain.  Last bowel movement was on 12/15/2019.  Patient is on narcotics for pain control.   Review of Systems  Constitutional: Positive for appetite change, fatigue and unexpected weight change. Negative for chills and fever.  HENT:   Negative for hearing loss and voice change.   Eyes: Negative for eye problems and  icterus.  Respiratory: Positive for cough. Negative for chest tightness and shortness of breath.   Cardiovascular: Negative for chest pain and leg swelling.  Gastrointestinal: Positive for nausea and vomiting. Negative for abdominal distention and abdominal pain.  Endocrine: Negative for hot flashes.  Genitourinary: Negative for difficulty urinating, dysuria and frequency.   Musculoskeletal: Negative for arthralgias.  Skin: Negative for itching and rash.  Neurological: Negative for light-headedness and numbness.  Hematological: Negative for adenopathy. Does not bruise/bleed easily.  Psychiatric/Behavioral: Negative for confusion.    No Known Allergies  Patient Active Problem List   Diagnosis Date Noted  . Weakness 12/19/2019  . Palliative care patient 11/30/2019  . Goals of care, counseling/discussion 11/15/2019  . Esophageal adenocarcinoma ([place]) 11/15/2019  . Mass of joint of right shoulder 10/28/2019  . Essential hypertension 10/28/2019  . GERD (gastroesophageal reflux disease) 10/28/2019     Past Medical History:  Diagnosis Date  . Allergy   . Cancer ([place])   . Esophageal cancer ([place])   . GERD (gastroesophageal reflux disease)    Has required esophageal dilation in past  . Hypertension      Past Surgical History:  Procedure Laterality Date  . ESOPHAGEAL DILATION    . PEG TUBE PLACEMENT  11/30/2019  . PORTA CATH INSERTION N/A 11/19/2019   Procedure: PORTA CATH INSERTION;  Surgeon: Algernon Huxley, MD;  Location: [facility];  Service: Cardiovascular;  Laterality: N/A;    Social History   Socioeconomic History  . Marital status: Married    Spouse name: Not on file  . Number of children: Not on file  . Years of education: Not on file  . Highest education level: Not on file  Occupational History  . Occupation: full time  Tobacco Use  .  Smoking status: Former Smoker    Packs/day: 1.00    Years: 4.00    Pack years: 4.00    Types: Cigarettes    Quit date:  10/14/1978    Years since quitting: 41.2  . Smokeless tobacco: Never Used  Vaping Use  . Vaping Use: Never used  Substance and Sexual Activity  . Alcohol use: No  . Drug use: Never  . Sexual activity: Not Currently  Other Topics Concern  . Not on file  Social History Narrative  . Not on file   Social Determinants of Health   Financial Resource Strain:   . Difficulty of Paying Living Expenses: Not on file  Food Insecurity:   . Worried About Charity fundraiser in the Last Year: Not on file  . Ran Out of Food in the Last Year: Not on file  Transportation Needs:   . Lack of Transportation (Medical): Not on file  . Lack of Transportation (Non-Medical): Not on file  Physical Activity:   . Days of Exercise per Week: Not on file  . Minutes of Exercise per Session: Not on file  Stress:   . Feeling of Stress : Not on file  Social Connections:   . Frequency of Communication with Friends and Family: Not on file  . Frequency of Social Gatherings with Friends and Family: Not on file  . Attends Religious Services: Not on file  . Active Member of Clubs or Organizations: Not on file  . Attends Archivist Meetings: Not on file  . Marital Status: Not on file  Intimate Partner Violence:   . Fear of Current or Ex-Partner: Not on file  . Emotionally Abused: Not on file  . Physically Abused: Not on file  . Sexually Abused: Not on file     Family History  Problem Relation Age of Onset  . Heart disease Father   . Hearing loss Father   . Diabetes Father   . Heart disease Paternal Grandmother   . Diabetes Paternal Grandmother   . Heart attack Paternal Grandfather      Current Facility-Administered Medications:  .  0.9 %  sodium chloride infusion, , Intravenous, Continuous, Dessa Phi, DO, Last Rate: 100 mL/hr at 12/19/19 0903, New Bag at 12/19/19 0903 .  acetaminophen (TYLENOL) tablet 650 mg, 650 mg, Oral, Q6H PRN **OR** acetaminophen (TYLENOL) suppository 650 mg, 650 mg,  Rectal, Q6H PRN, Mansy, Jan A, MD .  enoxaparin (LOVENOX) injection 65 mg, 65 mg, Subcutaneous, Q24H, Mansy, Jan A, MD, 65 mg at 12/19/19 0900 .  fentaNYL (DURAGESIC) 12 MCG/HR 1 patch, 1 patch, Transdermal, Q72H, Mansy, Jan A, MD .  lidocaine-prilocaine (EMLA) cream 1 application, 1 application, Topical, Once, Mansy, Jan A, MD .  magnesium hydroxide (MILK OF MAGNESIA) suspension 30 mL, 30 mL, Oral, Daily PRN, Mansy, Jan A, MD .  metoCLOPramide (REGLAN) 5 MG/5ML solution 5 mg, 5 mg, Oral, TID AC, Belue, Alver Sorrow, RPH .  morphine 2 MG/ML injection 2 mg, 2 mg, Intravenous, Q4H PRN, Mansy, Jan A, MD .  OLANZapine (ZYPREXA) tablet 10 mg, 10 mg, Oral, QHS, Mansy, Jan A, MD .  ondansetron (ZOFRAN) tablet 4 mg, 4 mg, Oral, Q6H PRN **OR** ondansetron (ZOFRAN) injection 4 mg, 4 mg, Intravenous, Q6H PRN, Mansy, Jan A, MD .  ondansetron (ZOFRAN-ODT) disintegrating tablet 8 mg, 8 mg, Oral, Q8H PRN, Mansy, Jan A, MD .  oxyCODONE (ROXICODONE) 5 MG/5ML solution 5 mg, 5 mg, Oral, Q4H PRN, Mansy, Arvella Merles, MD, 5  mg at 12/19/19 1306 .  pantoprazole (PROTONIX) EC tablet 40 mg, 40 mg, Oral, BID, Mansy, Jan A, MD, 40 mg at 12/19/19 0901 .  sennosides (SENOKOT) 8.8 MG/5ML syrup 10 mL, 10 mL, Oral, QHS, Mansy, Jan A, MD .  traZODone (DESYREL) tablet 25 mg, 25 mg, Oral, QHS PRN, Mansy, Arvella Merles, MD   Physical exam:  Vitals:   12/19/19 0630 12/19/19 0650 12/19/19 0655 12/19/19 0909  BP: 107/65   (!) 108/46  Pulse: (!) 111 (!) 124  (!) 113  Resp:   16 17  Temp:    98.1 F (36.7 C)  TempSrc:    Oral  SpO2: 97% 96% 96% 96%  Weight:      Height:       Physical Exam Constitutional:      General: He is not in acute distress.    Appearance: He is not diaphoretic.  HENT:     Head: Normocephalic and atraumatic.     Nose: Nose normal.     Mouth/Throat:     Pharynx: No oropharyngeal exudate.     Comments: No thrush Eyes:     General: No scleral icterus.    Pupils: Pupils are equal, round, and reactive to light.    Cardiovascular:     Rate and Rhythm: Normal rate and regular rhythm.     Heart sounds: No murmur heard.   Pulmonary:     Effort: Pulmonary effort is normal. No respiratory distress.     Breath sounds: No rales.  Chest:     Chest wall: No tenderness.  Abdominal:     General: There is no distension.     Palpations: Abdomen is soft.     Tenderness: There is no abdominal tenderness.     Comments: + PEG tube  Musculoskeletal:        General: Normal range of motion.     Cervical back: Normal range of motion and neck supple.  Skin:    General: Skin is warm and dry.     Findings: No erythema.  Neurological:     Mental Status: He is alert and oriented to person, place, and time.     Cranial Nerves: No cranial nerve deficit.     Motor: No abnormal muscle tone.     Coordination: Coordination normal.  Psychiatric:        Mood and Affect: Affect normal.         CMP Latest Ref Rng & Units 12/19/2019  Glucose 70 - 99 mg/dL 132(H)  BUN 6 - 20 mg/dL 23(H)  Creatinine 0.61 - 1.24 mg/dL 1.44(H)  Sodium 135 - 145 mmol/L 131(L)  Potassium 3.5 - 5.1 mmol/L 4.2  Chloride 98 - 111 mmol/L 93(L)  CO2 22 - 32 mmol/L 32  Calcium 8.9 - 10.3 mg/dL 9.6  Total Protein 6.5 - 8.1 g/dL 6.0(L)  Total Bilirubin 0.3 - 1.2 mg/dL 1.1  Alkaline Phos 38 - 126 U/L 78  AST 15 - 41 U/L 37  ALT 0 - 44 U/L 44   CBC Latest Ref Rng & Units 12/19/2019  WBC 4.0 - 10.5 K/uL 1.2(LL)  Hemoglobin 13.0 - 17.0 g/dL 10.7(L)  Hematocrit 39 - 52 % 33.6(L)  Platelets 150 - 400 K/uL 127(L)    RADIOGRAPHIC STUDIES: I have personally reviewed the radiological images as listed and agreed with the findings in the report. NM PET Image Initial (PI) Skull Base To Thigh  Result Date: 11/23/2019 CLINICAL DATA:  Initial treatment strategy for metastatic esophageal  adenocarcinoma. Known right scapular metastasis. EXAM: NUCLEAR MEDICINE PET SKULL BASE TO THIGH TECHNIQUE: 15.4 mCi F-18 FDG was injected intravenously. Full-ring PET  imaging was performed from the skull base to thigh after the radiotracer. CT data was obtained and used for attenuation correction and anatomic localization. Fasting blood glucose: 121 mg/dl COMPARISON:  CT abdomen/pelvis dated 10/21/2019. MRI right shoulder dated 10/16/2019. FINDINGS: Mediastinal blood pool activity: SUV max 3.7 Liver activity: SUV max NA NECK: No hypermetabolic cervical lymphadenopathy. Incidental CT findings: none CHEST: Mass involving the lower 3rd of the esophagus (series 3/image 123), max SUV 15.0, corresponding to the patient's known primary esophageal neoplasm. 10 mm anterior left upper lobe nodule (series 3/image 85), max SUV 4.0, compatible with pulmonary metastasis. No hypermetabolic thoracic lymphadenopathy. Right chest port terminates in the lower SVC. Incidental CT findings: Mild atherosclerotic calcifications of the aortic arch. ABDOMEN/PELVIS: 3.0 cm right adrenal metastasis (series 3/image 152), max SUV 9.0. 8 mm short axis celiac axis node (series 3/image 149), max SUV 6.4. Incidental CT findings: Layering gallbladder sludge versus noncalcified gallstones. Mild atherosclerotic calcifications of the abdominal aorta and branch vessels. SKELETON: Widespread osseous metastases throughout the visualized axial and appendicular skeleton, including: --Destructive right scapular mass, max SUV 7.7 --Right sternum, max SUV 7.4 --Right 5th costosternal junction, max SUV 7.1 --T12 vertebral body, max SUV 12.4 --Destructive left iliac bone mass, max SUV 11.4 --Right proximal femoral shaft, max SUV 5.4 Incidental CT findings: Degenerative changes of the visualized thoracolumbar spine. IMPRESSION: Distal esophageal mass, corresponding to the patient's known primary esophageal neoplasm. 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 Widespread osseous metastases, including destructive lesions in the right scapula and left iliac bone, as above.  Electronically Signed   By: Julian Hy M.D.   On: 11/23/2019 11:43   DG Chest Portable 1 View  Result Date: 12/19/2019 CLINICAL DATA:  Weakness. EXAM: PORTABLE CHEST 1 VIEW COMPARISON:  October 10, 2018.  PET-CT dated 11/23/2019 FINDINGS: There is a well-positioned right-sided Port-A-Cath. Again noted is a lytic lesion involving the right glenoid. There is no pneumothorax. No large pleural effusion. There is some atelectasis at the lung bases. There is a lytic lesion involving the posterior eighth rib on the right. The heart size is unremarkable. IMPRESSION: 1. No acute cardiopulmonary process. 2. Well-positioned right-sided Port-A-Cath. 3. Lytic lesion involving the right glenoid and right eighth rib. Electronically Signed   By: Constance Holster M.D.   On: 12/19/2019 02:37    Assessment and plan-  #Chemotherapy-induced neutropenia, Patient has received long-acting GCF support within the past 14 days.  Short-acting G-CSF is not indicated at this point. Patient is afebrile.  Continue neutropenia precaution. I will add prophylactic Cipro 500 mg every 12 hours, continue until [place] is above 500. Thrombocytopenia and anemia, chemotherapy-induced, monitor CBC daily  #Nausea vomiting after tube feeding. Check abdomen x-ray IV antiemetics Consult dietitian for tube feeding  #Neoplasm induced pain, continue fentanyl patch and oxycodone.  Pain is well controlled. #Dehydration, continue gentle hydration with continuous IV normal saline 100 cc/h.  Monitor urine output and kidney function.   Thank you for allowing me to participate in the care of this patient. Dr.Rao will follow up tomorrow.   Earlie Server, MD, PhD Hematology Oncology [facility] Pager- [phone] 12/19/2019

## 2019-12-19 NOTE — ED Notes (Signed)
Pt states nausea, no vomiting or diarrhea

## 2019-12-19 NOTE — ED Triage Notes (Signed)
Pt in for increased weakness. Pt has chemo/radiation treatment for esophageal cancer and a mass to the right shoulder.  Pt states he is normally able to walk but today feels to weak to walk.  Vitals from EMS: 118/82 HR 120 95% RA 166 CGB RR 18

## 2019-12-19 NOTE — ED Notes (Signed)
04:45 medications not yet verified by pharmacy

## 2019-12-19 NOTE — ED Provider Notes (Signed)
[facility] Emergency Department Provider Note  Time seen: 1:44 AM  I have reviewed the triage vital signs and the nursing notes.   HISTORY  Chief Complaint Weakness   HPI Jacob Henson is a 59 y.o. male with a past medical history of esophageal cancer, hypertension, currently on radiation and chemotherapy presents to the emergency department for generalized weakness.  According to the patient he last received chemotherapy approximately 5 days ago, over the past several days has had progressively worsening weakness to the point where he is unable to walk yesterday and into today even with his wife's attempted assistance.  Patient has been on chemotherapy for approximately 1 month.  Denies any fever.  Does state a slight cough but nothing increased from baseline.  No shortness of breath has nausea which he states is chronic and has been taking medication for this.  Denies any recent vomiting or diarrhea.  No abdominal pain.  No dysuria.   Past Medical History:  Diagnosis Date  . Allergy   . Cancer ([place])   . Esophageal cancer ([place])   . GERD (gastroesophageal reflux disease)    Has required esophageal dilation in past  . Hypertension     Patient Active Problem List   Diagnosis Date Noted  . Palliative care patient 11/30/2019  . Goals of care, counseling/discussion 11/15/2019  . 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 Mass of joint of right shoulder 10/28/2019  . Essential hypertension 10/28/2019  . GERD (gastroesophageal reflux disease) 10/28/2019    Past Surgical History:  Procedure Laterality Date  . ESOPHAGEAL DILATION    . PEG TUBE PLACEMENT  11/30/2019  . PORTA CATH INSERTION N/A 11/19/2019   Procedure: PORTA CATH INSERTION;  Surgeon: Algernon Huxley, MD;  Location: [facility];  Service: Cardiovascular;  Laterality: N/A;    Prior to Admission medications   Medication Sig Start Date End Date Taking? Authorizing Provider   lidocaine-prilocaine (EMLA) cream Apply to affected area once Patient taking differently: Apply 1 application topically once. Apply to affected area once 11/18/19   Sindy Guadeloupe, MD  metoCLOPramide (REGLAN) 5 MG/5ML solution Take 5 mLs (5 mg total) by mouth 3 (three) times daily before meals. 12/09/19   Sindy Guadeloupe, MD  OLANZapine (ZYPREXA) 10 MG tablet Take 1 tablet (10 mg total) by mouth at bedtime. 11/15/19   Sindy Guadeloupe, MD  ondansetron (ZOFRAN-ODT) 8 MG disintegrating tablet Take 1 tablet (8 mg total) by mouth every 8 (eight) hours as needed for nausea or vomiting. 11/22/19   Sindy Guadeloupe, MD  oxyCODONE (ROXICODONE) 5 MG/5ML solution Take 5 mLs (5 mg total) by mouth every 4 (four) hours as needed for severe pain. 12/09/19   Sindy Guadeloupe, MD  oxyCODONE-acetaminophen (PERCOCET) 5-325 MG tablet Take 1-2 tablets by mouth every 4 (four) hours as needed for severe pain. 11/30/19 11/29/20  Pabon, [place], MD  pantoprazole (PROTONIX) 40 MG tablet Take 40 mg by mouth 2 (two) times daily.  10/13/19   [provider]  sennosides (SENOKOT) 8.8 MG/5ML syrup Take 10 mLs by mouth at bedtime. Through peg tube, and hold if diarrhea. 12/07/19   Sindy Guadeloupe, MD  sucralfate (CARAFATE) 1 GM/10ML suspension Take 1 g by mouth 4 (four) times daily as needed (stomach pain).  10/29/19   [provider]    No Known Allergies  Family History  Problem Relation Age of Onset  . Heart disease Father   . Hearing loss  Father   . Diabetes Father   . Heart disease Paternal Grandmother   . Diabetes Paternal Grandmother   . Heart attack Paternal Grandfather     Social History Social History   Tobacco Use  . Smoking status: Former Smoker    Packs/day: 1.00    Years: 4.00    Pack years: 4.00    Types: Cigarettes    Quit date: 10/14/1978    Years since quitting: 41.2  . Smokeless tobacco: Never Used  Vaping Use  . Vaping Use: Never used  Substance Use Topics  . Alcohol use: No  . Drug  use: Never    Review of Systems Constitutional: Negative for fever.  Positive for generalized weakness. Cardiovascular: Negative for chest pain. Respiratory: Negative for shortness of breath.  Occasional cough which is chronic. Gastrointestinal: Negative for abdominal pain.  Positive for nausea.  Negative for vomiting or diarrhea. Genitourinary: Negative for urinary compaints Musculoskeletal: Negative for musculoskeletal complaints Neurological: Negative for headache All other ROS negative  ____________________________________________   PHYSICAL EXAM:  VITAL SIGNS: ED Triage Vitals [12/19/19 0139]  Enc Vitals Group     BP (!) 132/98     Pulse Rate (!) 116     Resp 18     Temp 99.1 F (37.3 C)     Temp Source Oral     SpO2 96 %     Weight 285 lb (129.3 kg)     Height 6\' 5"  (1.956 m)     Head Circumference      Peak Flow      Pain Score 0     Pain Loc      Pain Edu?      Excl. in [place]?     Constitutional: Alert and oriented.  Patient does appear fatigued. Eyes: Normal exam ENT      Head: Normocephalic and atraumatic.      Mouth/Throat: Mucous membranes are moist. Cardiovascular: Normal rate, regular rhythm. Respiratory: Normal respiratory effort without tachypnea nor retractions. Breath sounds are clear Gastrointestinal: Soft and nontender. No distention.  G-tube present. Musculoskeletal: Nontender with normal range of motion in all extremities. Neurologic:  Normal speech and language. No gross focal neurologic deficits  Skin:  Skin is warm, dry and intact.  Psychiatric: Mood and affect are normal.  ____________________________________________    EKG  EKG viewed and interpreted by myself shows sinus tachycardia 110 bpm with a slightly widened QRS, normal axis, largely normal intervals with nonspecific ST changes  ____________________________________________    RADIOLOGY  Chest x-ray shows no acute  abnormality.  ____________________________________________   INITIAL IMPRESSION / ASSESSMENT AND PLAN / ED COURSE  Pertinent labs & imaging results that were available during my care of the patient were reviewed by me and considered in my medical decision making (see chart for details).   Patient presents to the emergency department for generalized fatigue weakness.  Patient is currently on chemotherapy and radiation for esophageal cancer.  Largely negative review of systems.  We will check labs, chest x-ray, urinalysis, IV hydrate, treat pain and nausea and continue to closely monitor.  Patient has chronic pain in his right shoulder due to the mass.  Lab work shows mild renal insufficiency compared to baseline.  Troponin is slightly elevated although appears to be largely at baseline.  Patient is neutropenic with an absolute neutrophil count of 0.1.  Given the patient's generalized weakness acute renal insufficiency and neutropenic status we will admit to the hospital service for work-up and treatment.  Patient agreeable to plan of care.  We will continue to IV hydrate.  ALYAAN BUDZYNSKI was evaluated in Emergency Department on 12/19/2019 for the symptoms described in the history of present illness. He was evaluated in the context of the global COVID-19 pandemic, which necessitated consideration that the patient might be at risk for infection with the SARS-CoV-2 virus that causes COVID-19. Institutional protocols and algorithms that pertain to the evaluation of patients at risk for COVID-19 are in a state of rapid change based on information released by regulatory bodies including the CDC and federal and state organizations. These policies and algorithms were followed during the patient's care in the ED.  ____________________________________________   FINAL CLINICAL IMPRESSION(S) / ED DIAGNOSES  Acute renal insufficiency Weakness   Harvest Dark, MD 12/19/19 [phone]

## 2019-12-19 NOTE — ED Notes (Signed)
Charge RN attempted to call report, was told to call back because room not assigned to floor RN at this time.

## 2019-12-19 NOTE — Progress Notes (Signed)
Initial Nutrition Assessment  DOCUMENTATION CODES:   Obesity unspecified  INTERVENTION:   Initiate Osmolite 1.5 @ 35ml/hr- Initiate at 96ml/hr and increase by 97ml/hr q 8 hours until goal rate is reached.   Pro-Source 76ml BID via tube, provides 40kcal and 11g of protein per serving   Free water flushes 8ml q4 hours to maintain tube patency- Once IVF discontinued recommend increase free water flushes to 2103ml q 4 hours  Regimen provides 2960kcal/day, 142g/day protein and 1655ml/day free water  Pt at high refeed risk; recommend monitor potassium, magnesium and phosphorus labs daily until stable  NUTRITION DIAGNOSIS:   Inadequate oral intake related to cancer and cancer related treatments as evidenced by per patient/family report, other (comment) (Pt with G-tube).  GOAL:   Patient will meet greater than or equal to 90% of their needs  MONITOR:   PO intake, Labs, Weight trends, TF tolerance, Skin, I & O's  REASON FOR ASSESSMENT:   Consult Enteral/tube feeding initiation and management  ASSESSMENT:   59 y.o. 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   Pt s/p surgically placed G-tube 10/19  RD working remotely.  Spoke with pt's wife via phone. Wife reports pt with extreme weakness at home. Pt has not been able to tolerate his home tube feeds. Tube feeds were initiated on 10/20. Pt was started on bolus feeds of [employer] Standard formula; pt was able to get up to goal rate of 7 cans daily but on 10/26, pt started having nausea, vomiting, bloating and early satiety. On 10/28 pt was switched to continuous feeds with a goal rate of 169ml/hr. Wife reports that was able to advance pt to 80ml/hr but she could not advanced rate any further. Pt has continued to  have nausea and vomiting.  Wife reports that after pt vomits, he will refuses to have tube feeds restarted until the next day. Pt has not been receiving the majority of his tube feeds at this point. Wife has been putting some Ensure down his tube to try and maintain him until he can tolerate the tube feeds. Spoke with wife regarding the different types of formulas and the differences between them. Recommended changing to a formula without fiber until pt is able to tolerate his feeds better. Wife agrees to change formulas. Will switch to Osmolite 1.5 and continue with continuous feeds. Pt has been eating some foods by mouth for pleasure but has been vomiting often after eating. Wife also reports that pt has been struggling with constipation and reflux. Pt may have some degree of chemo induced gastroparesis. Pt initiated on reglan in hospital. Will initiate low fiber feeds tonight.   Per wife report, pt has lost ~100lbs since being diagnosed with cancer. Wife reports pt weighed 257lbs last week on his standing scale; pt weighed at 285lbs in hospital. RD unsure as to how much exact weight pt has lost but it seems to be significant. Pt weighed 322lbs in 7/21 which would be a 38lb(12%) weight loss in 3 months if admit weight is correct; this is significant. Pt is at high risk for malnutrition. RD will obtain nutrition related exam at follow-up.   Medications reviewed and include: ciprofloxacin, lovenox, fentanyl, reglan, protonix, senokot, NaCl @100ml /hr  Labs reviewed: Na 131(L), BUN 23(H), creat 1.44(H) Wbc- 1.2(L), Hct 10.7(L), Hct 33.6(L)  NUTRITION - FOCUSED PHYSICAL EXAM: Unable to perform at this time   Diet Order:   Diet Order            DIET SOFT Room service appropriate? Yes; Fluid consistency: Thin  Diet effective now                EDUCATION NEEDS:   Education needs have been addressed  Skin:  Skin Assessment: Reviewed RN Assessment  Last BM:  11/3  Height:   Ht Readings from  Last 1 Encounters:  12/19/19 6\' 5"  (1.956 m)    Weight:   Wt Readings from Last 1 Encounters:  12/19/19 129.3 kg    BMI:  Body mass index is 33.8 kg/m.  Estimated Nutritional Needs:   Kcal:  2700-3000kcal/day  Protein:  135-150g/day  Fluid:  2.9-3.2L/day  Koleen Distance MS, RD, LDN Please refer to [facility] for RD and/or RD on-call/weekend/after hours pager

## 2019-12-20 ENCOUNTER — Ambulatory Visit: Payer: 59

## 2019-12-20 ENCOUNTER — Inpatient Hospital Stay: Payer: 59

## 2019-12-20 DIAGNOSIS — Z515 Encounter for palliative care: Secondary | ICD-10-CM | POA: Diagnosis not present

## 2019-12-20 DIAGNOSIS — C159 Malignant neoplasm of esophagus, unspecified: Secondary | ICD-10-CM

## 2019-12-20 DIAGNOSIS — N179 Acute kidney failure, unspecified: Secondary | ICD-10-CM | POA: Diagnosis not present

## 2019-12-20 DIAGNOSIS — G893 Neoplasm related pain (acute) (chronic): Secondary | ICD-10-CM

## 2019-12-20 DIAGNOSIS — C158 Malignant neoplasm of overlapping sites of esophagus: Secondary | ICD-10-CM | POA: Diagnosis not present

## 2019-12-20 DIAGNOSIS — R112 Nausea with vomiting, unspecified: Secondary | ICD-10-CM | POA: Diagnosis not present

## 2019-12-20 DIAGNOSIS — D709 Neutropenia, unspecified: Secondary | ICD-10-CM | POA: Diagnosis not present

## 2019-12-20 DIAGNOSIS — R531 Weakness: Secondary | ICD-10-CM | POA: Diagnosis not present

## 2019-12-20 LAB — BASIC METABOLIC PANEL
Anion gap: 7 (ref 5–15)
BUN: 24 mg/dL — ABNORMAL HIGH (ref 6–20)
CO2: 29 mmol/L (ref 22–32)
Calcium: 9.7 mg/dL (ref 8.9–10.3)
Chloride: 98 mmol/L (ref 98–111)
Creatinine, Ser: 1.38 mg/dL — ABNORMAL HIGH (ref 0.61–1.24)
GFR, Estimated: 59 mL/min — ABNORMAL LOW (ref >60)
Glucose, Bld: 127 mg/dL — ABNORMAL HIGH (ref 70–99)
Potassium: 4 mmol/L (ref 3.5–5.1)
Sodium: 134 mmol/L — ABNORMAL LOW (ref 135–145)

## 2019-12-20 LAB — PHOSPHORUS: Phosphorus: 2.6 mg/dL (ref 2.5–4.6)

## 2019-12-20 LAB — CBC
HCT: 30.4 % — ABNORMAL LOW (ref 39.0–52.0)
Hemoglobin: 10 g/dL — ABNORMAL LOW (ref 13.0–17.0)
MCH: 30.6 pg (ref 26.0–34.0)
MCHC: 32.9 g/dL (ref 30.0–36.0)
MCV: 93 fL (ref 80.0–100.0)
Platelets: 135 10*3/uL — ABNORMAL LOW (ref 150–400)
RBC: 3.27 MIL/uL — ABNORMAL LOW (ref 4.22–5.81)
RDW: 14.7 % (ref 11.5–15.5)
WBC: 1.5 10*3/uL — ABNORMAL LOW (ref 4.0–10.5)
nRBC: 0 % (ref 0.0–0.2)

## 2019-12-20 LAB — MAGNESIUM: Magnesium: 1.9 mg/dL (ref 1.7–2.4)

## 2019-12-20 IMAGING — MR MR HEAD WO/W CM
14 series · 44 of 48 positions shown · IV contrast (gadavist)
Comparison: None available.

CLINICAL DATA: Initial evaluation for intractable nausea, history
of stage IV esophageal cancer. Evaluate for metastatic disease.

EXAM:
MRI HEAD WITHOUT AND WITH CONTRAST
TECHNIQUE: Multiplanar, multiecho pulse sequences of the brain and surrounding
structures were obtained without and with intravenous contrast.
CONTRAST:  10mL GADAVIST GADOBUTROL 1 MMOL/ML IV SOLN

[Series 5: ax dwi_tracew · axial · 3.0mm · 0.60mm/px · z∈[-42,+110]mm · 4 of 48 slices shown]
[im 1/48]
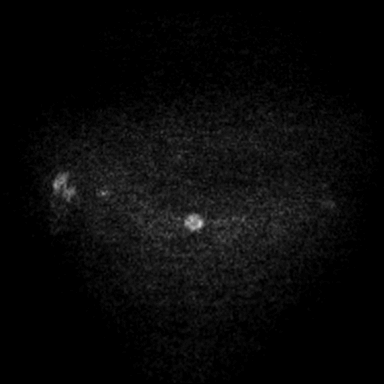
[im 16/48]
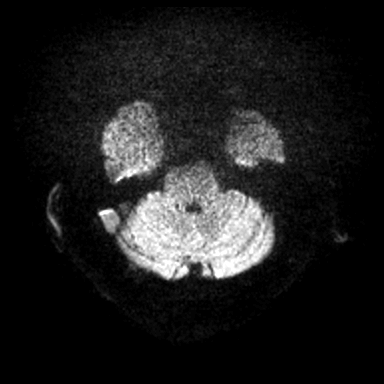
[im 32/48]
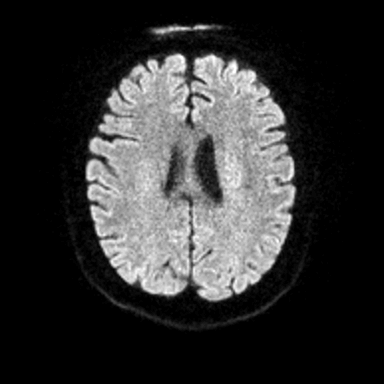
[im 48/48]
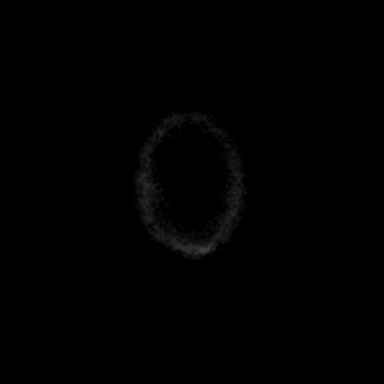

[Series 6: ax dwi_adc · axial · 3.0mm · 0.60mm/px · z∈[-42,+103]mm · 4 of 46 slices shown]
[im 1/46]
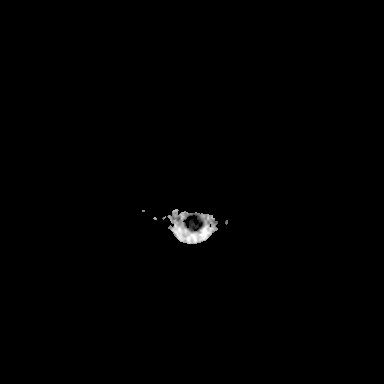
[im 16/46]
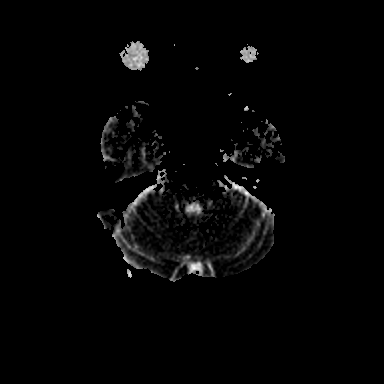
[im 31/46]
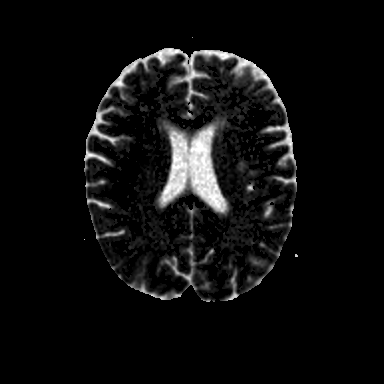
[im 46/46]
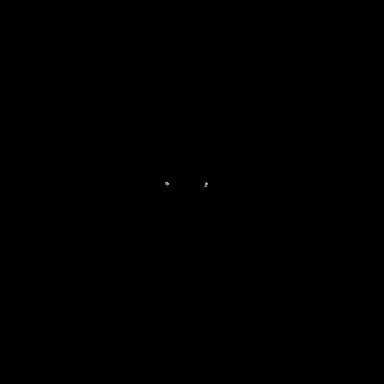

[Series 7: cor dwi_tracew · coronal · 5.0mm · 0.60mm/px · 2 of 40 slices shown]
[im 1/40]
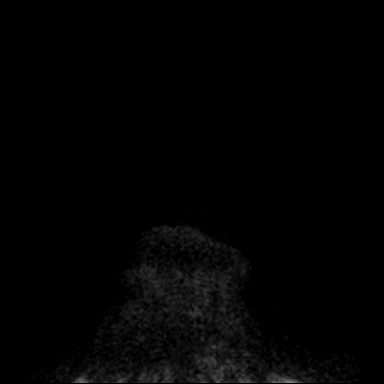
[im 40/40]
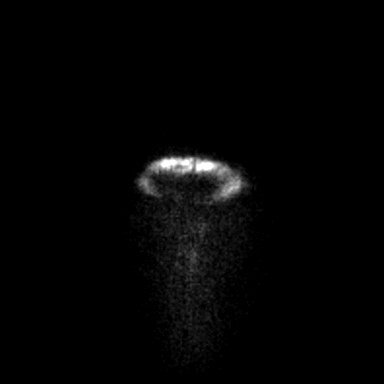

[Series 8: cor dwi_adc · coronal · 5.0mm · 0.60mm/px · 2 of 35 slices shown]
[im 1/35]
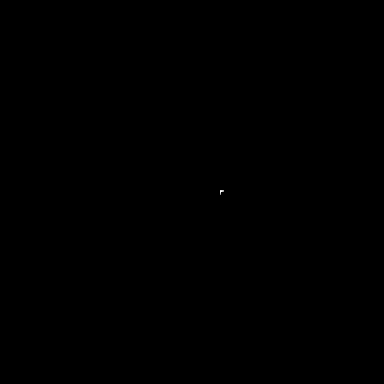
[im 35/35]
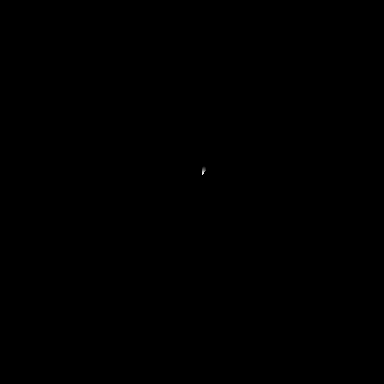

[Series 9: T1 · sagittal · 5.0mm · 0.62mm/px · 1 of 25 slices shown (1 of 2)]
[im 1/25]
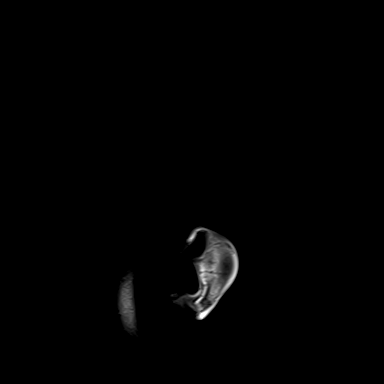

[Series 10: T2 · axial · 5.0mm · 0.53mm/px · 1 of 25 slices shown]
[im 1/25]
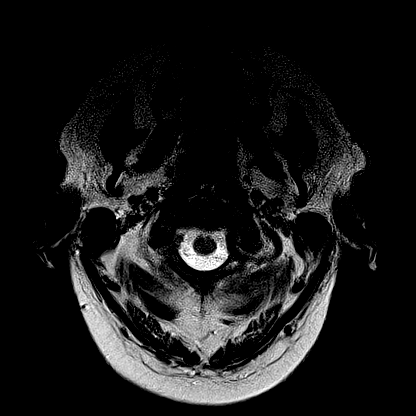

[Series 12: pha_images · axial · 3.0mm · 0.90mm/px · z∈[-59,+116]mm · 3 of 60 slices shown]
[im 1/60]
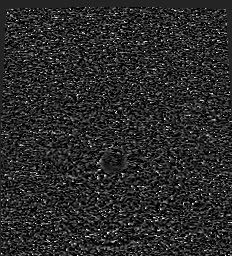
[im 30/60]
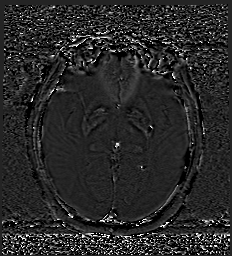
[im 60/60]
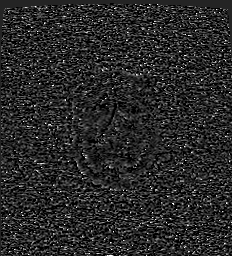

[Series 13: swi_images · axial · 3.0mm · 0.90mm/px · z∈[-59,+116]mm · 3 of 60 slices shown]
[im 1/60]
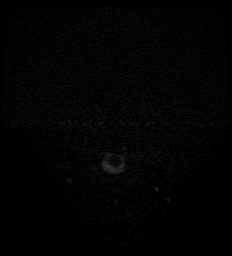
[im 30/60]
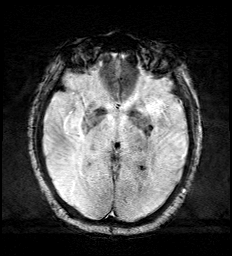
[im 60/60]
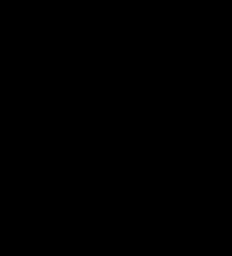

[Series 15: FLAIR · axial · 3.0mm · 0.53mm/px · z∈[-52,+108]mm · 3 of 55 slices shown]
[im 1/55]
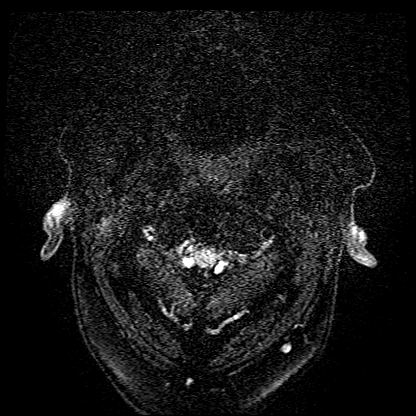
[im 28/55]
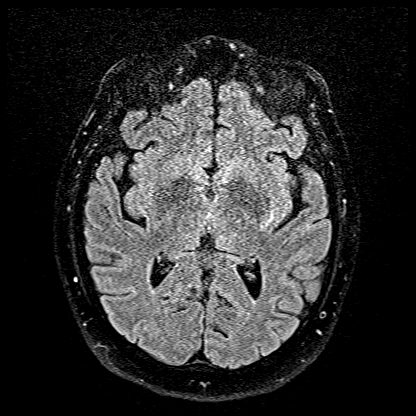
[im 55/55]
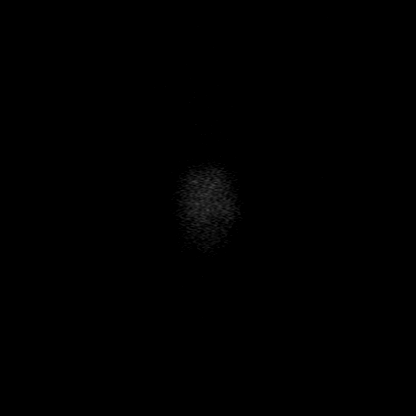

[Series 16: T1 · axial · 1.0mm · 0.98mm/px · z∈[-58,+115]mm · 8 of 176 slices shown (2 of 2)]
[im 1/176]
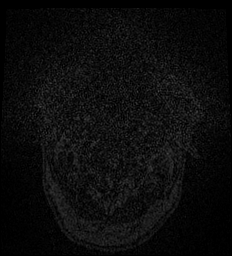
[im 20/176]
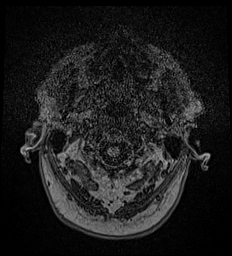
[im 59/176]
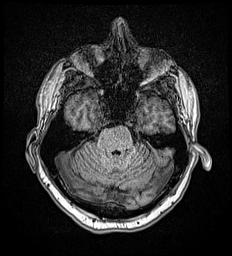
[im 78/176]
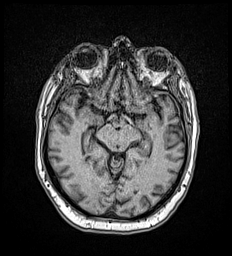
[im 98/176]
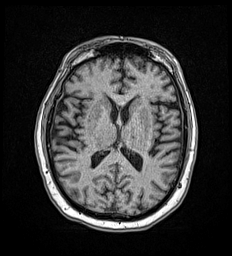
[im 117/176]
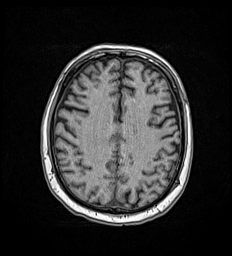
[im 156/176]
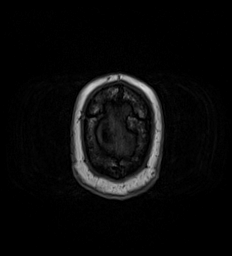
[im 176/176]
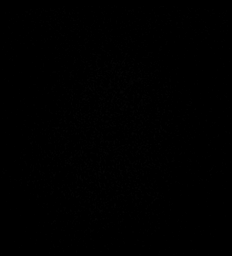

[Series 17: T2 post-contrast · coronal · 5.0mm · 0.57mm/px · 2 of 29 slices shown]
[im 1/29]
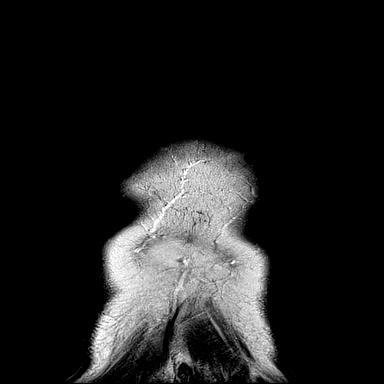
[im 29/29]
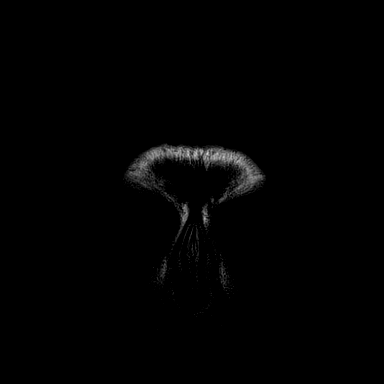

[Series 18: T1 post-contrast · axial · 1.0mm · 0.98mm/px · z∈[-58,+115]mm · 8 of 176 slices shown (1 of 3)]
[im 1/176]
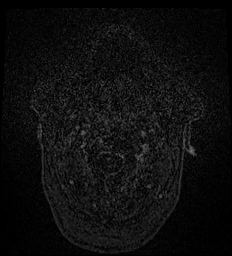
[im 20/176]
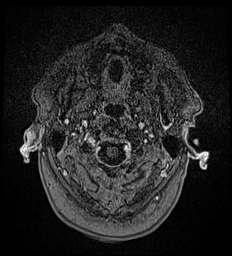
[im 59/176]
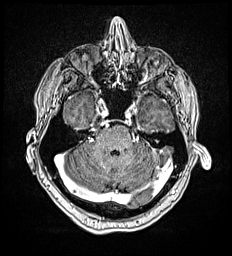
[im 78/176]
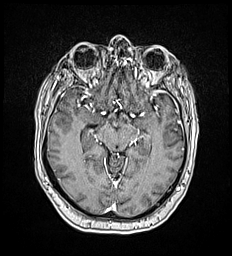
[im 98/176]
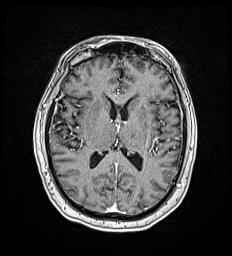
[im 117/176]
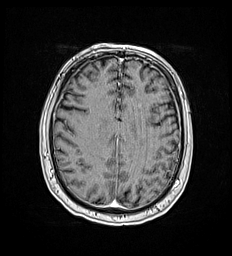
[im 156/176]
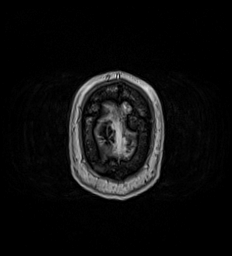
[im 176/176]
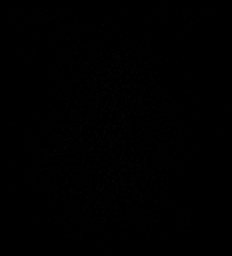

[Series 19: T1 post-contrast · coronal · 5.0mm · 0.57mm/px · 2 of 29 slices shown (2 of 3)]
[im 1/29]
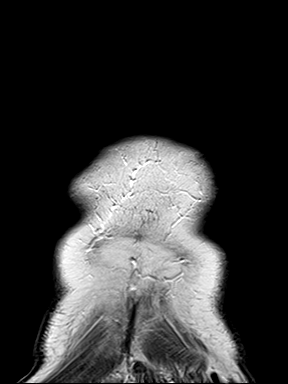
[im 29/29]
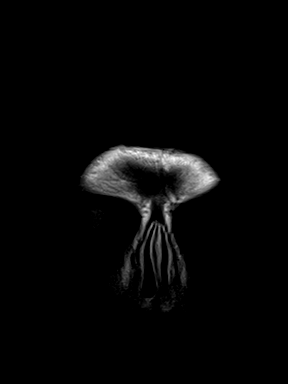

[Series 20: T1 post-contrast · sagittal · 5.0mm · 0.62mm/px · 1 of 25 slices shown (3 of 3)]
[im 1/25]
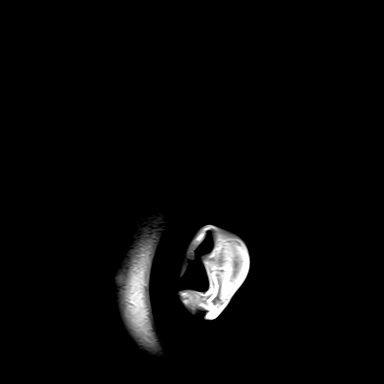

[44 of 48 positions shown; findings below may reference images not displayed]

FINDINGS: Brain: Cerebral volume within normal limits for age. Few scattered
foci of subcentimeter T2/FLAIR hyperintensity noted within the
periventricular deep white matter both cerebral hemispheres,
nonspecific, but most like related chronic microvascular ischemic
disease, mild for age. Superimposed remote lacunar infarcts noted
within the left thalamus and basal ganglia.

No abnormal foci of restricted diffusion to suggest acute or
subacute ischemia. Gray-white matter differentiation maintained. No
encephalomalacia to suggest chronic cortical infarction elsewhere
within the brain. No acute intracranial hemorrhage. Few punctate
chronic micro hemorrhages noted within the cerebellum, brainstem,
and about the deep gray nuclei, most likely related to chronic
underlying hypertension.

No mass lesion, midline shift or mass effect. No abnormal
enhancement or evidence for intracranial metastatic disease.
Ventricles normal size without hydrocephalus. No extra-axial fluid
collection. Pituitary gland suprasellar region within normal limits.
Midline structures intact.

Vascular: Major intracranial vascular flow voids are maintained.

Skull and upper cervical spine: Craniocervical junction within
normal limits. Bone marrow signal intensity normal. No visible focal
marrow replacing lesion. No scalp soft tissue abnormality.

Sinuses/Orbits: Globes orbital soft tissues within normal limits.
Paranasal sinuses are largely clear. No mastoid effusion. Inner ear
structures within normal limits.

Other: None.
IMPRESSION: 1. No acute intracranial abnormality or evidence for intracranial
metastatic disease.
2. Mild chronic microvascular ischemic disease with superimposed
remote lacunar infarcts involving the left thalamus and basal
ganglia.
3. Few punctate chronic micro hemorrhages involving the cerebellum,
brainstem, and deep gray nuclei, most likely related to chronic
underlying hypertension.

## 2019-12-20 MED ORDER — GADOBUTROL 1 MMOL/ML IV SOLN
10.0000 mL | Freq: Once | INTRAVENOUS | Status: AC | PRN
  Administered 2019-12-20: 10 mL via INTRAVENOUS

## 2019-12-20 MED ORDER — FREE WATER
200.0000 mL | Status: DC
  Administered 2019-12-20 – 2019-12-24 (×22): 200 mL

## 2019-12-20 MED ORDER — CHLORHEXIDINE GLUCONATE CLOTH 2 % EX PADS
6.0000 | MEDICATED_PAD | Freq: Every day | CUTANEOUS | Status: DC
  Administered 2019-12-20 – 2019-12-27 (×8): 6 via TOPICAL

## 2019-12-20 NOTE — Progress Notes (Signed)
Hematology/Oncology Consult note [facility]  [phone] [phone]  Patient Care Team: Elby Beck, FNP as PCP - General (Nurse Practitioner) Clent Jacks, RN as Oncology Nurse Navigator Sindy Guadeloupe, MD as Consulting Physician (Oncology)   Name of the patient: Jacob Henson  [id number]  [birth year]   Date of visit: 12/20/2019   Interval history- nausea little better but patient states these episodes of nausea come out of nowhere and he is finding it difficult to continue continuous tube feeds. Oral intake is poor. He is moving his bowels  ECOG PS- 2 Pain scale- 0 Opioid associated constipation- no  Review of systems- Review of Systems  Constitutional: Positive for malaise/fatigue. Negative for chills, fever and weight loss.  HENT: Negative for congestion, ear discharge and nosebleeds.   Eyes: Negative for blurred vision.  Respiratory: Negative for cough, hemoptysis, sputum production, shortness of breath and wheezing.   Cardiovascular: Negative for chest pain, palpitations, orthopnea and claudication.  Gastrointestinal: Positive for nausea. Negative for abdominal pain, blood in stool, constipation, diarrhea, heartburn, melena and vomiting.  Genitourinary: Negative for dysuria, flank pain, frequency, hematuria and urgency.  Musculoskeletal: Negative for back pain, joint pain and myalgias.  Skin: Negative for rash.  Neurological: Negative for dizziness, tingling, focal weakness, seizures, weakness and headaches.  Endo/Heme/Allergies: Does not bruise/bleed easily.  Psychiatric/Behavioral: Negative for depression and suicidal ideas. The patient does not have insomnia.       No Known Allergies   Past Medical History:  Diagnosis Date  . Allergy   . Cancer ([place])   . Esophageal cancer ([place])   . GERD (gastroesophageal reflux disease)    Has required esophageal dilation in past  . Hypertension      Past  Surgical History:  Procedure Laterality Date  . ESOPHAGEAL DILATION    . PEG TUBE PLACEMENT  11/30/2019  . PORTA CATH INSERTION N/A 11/19/2019   Procedure: PORTA CATH INSERTION;  Surgeon: Algernon Huxley, MD;  Location: [facility];  Service: Cardiovascular;  Laterality: N/A;    Social History   Socioeconomic History  . Marital status: Married    Spouse name: Not on file  . Number of children: Not on file  . Years of education: Not on file  . Highest education level: Not on file  Occupational History  . Occupation: full time  Tobacco Use  . Smoking status: Former Smoker    Packs/day: 1.00    Years: 4.00    Pack years: 4.00    Types: Cigarettes    Quit date: 10/14/1978    Years since quitting: 41.2  . Smokeless tobacco: Never Used  Vaping Use  . Vaping Use: Never used  Substance and Sexual Activity  . Alcohol use: No  . Drug use: Never  . Sexual activity: Not Currently  Other Topics Concern  . Not on file  Social History Narrative  . Not on file   Social Determinants of Health   Financial Resource Strain:   . Difficulty of Paying Living Expenses: Not on file  Food Insecurity:   . Worried About Charity fundraiser in the Last Year: Not on file  . Jacob Out of Food in the Last Year: Not on file  Transportation Needs:   . Lack of Transportation (Medical): Not on file  . Lack of Transportation (Non-Medical): Not on file  Physical Activity:   . Days of Exercise per Week: Not on file  . Minutes of Exercise  per Session: Not on file  Stress:   . Feeling of Stress : Not on file  Social Connections:   . Frequency of Communication with Friends and Family: Not on file  . Frequency of Social Gatherings with Friends and Family: Not on file  . Attends Religious Services: Not on file  . Active Member of Clubs or Organizations: Not on file  . Attends Archivist Meetings: Not on file  . Marital Status: Not on file  Intimate Partner Violence:   . Fear of Current or  Ex-Partner: Not on file  . Emotionally Abused: Not on file  . Physically Abused: Not on file  . Sexually Abused: Not on file    Family History  Problem Relation Age of Onset  . Heart disease Father   . Hearing loss Father   . Diabetes Father   . Heart disease Paternal Grandmother   . Diabetes Paternal Grandmother   . Heart attack Paternal Grandfather      Current Facility-Administered Medications:  .  acetaminophen (TYLENOL) tablet 650 mg, 650 mg, Oral, Q6H PRN **OR** acetaminophen (TYLENOL) suppository 650 mg, 650 mg, Rectal, Q6H PRN, Mansy, Jan A, MD .  chlorhexidine (PERIDEX) 0.12 % solution 15 mL, 15 mL, Mouth Rinse, BID, Lorella Nimrod, MD, 15 mL at 12/20/19 0918 .  Chlorhexidine Gluconate Cloth 2 % PADS 6 each, 6 each, Topical, Daily, Dessa Phi, DO, 6 each at 12/20/19 0932 .  ciprofloxacin (CIPRO) tablet 500 mg, 500 mg, Oral, BID, Earlie Server, MD, 500 mg at 12/20/19 0918 .  enoxaparin (LOVENOX) injection 65 mg, 65 mg, Subcutaneous, Q24H, Mansy, Jan A, MD, 65 mg at 12/20/19 0919 .  feeding supplement (OSMOLITE 1.5 CAL) liquid 1,000 mL, 1,000 mL, Per Tube, Continuous, Dessa Phi, DO, Last Rate: 80 mL/hr at 12/20/19 1530, 1,000 mL at 12/20/19 1530 .  feeding supplement (PROSource TF) liquid 45 mL, 45 mL, Per Tube, BID, Dessa Phi, DO, 45 mL at 12/20/19 1203 .  fentaNYL (DURAGESIC) 12 MCG/HR 1 patch, 1 patch, Transdermal, Q72H, Mansy, Jan A, MD .  free water 200 mL, 200 mL, Per Tube, Q4H, Choi, Jennifer, DO, 200 mL at 12/20/19 1400 .  influenza vac split quadrivalent PF (FLUARIX) injection 0.5 mL, 0.5 mL, Intramuscular, Tomorrow-1000, Choi, Jennifer, DO .  lidocaine-prilocaine (EMLA) cream 1 application, 1 application, Topical, Once, Mansy, Jan A, MD .  magnesium hydroxide (MILK OF MAGNESIA) suspension 30 mL, 30 mL, Oral, Daily PRN, Mansy, Jan A, MD .  MEDLINE mouth rinse, 15 mL, Mouth Rinse, q12n4p, Lorella Nimrod, MD, 15 mL at 12/19/19 2317 .  metoCLOPramide (REGLAN) 5  MG/5ML solution 5 mg, 5 mg, Oral, TID AC, Belue, Alver Sorrow, RPH, 5 mg at 12/20/19 1631 .  morphine 2 MG/ML injection 2 mg, 2 mg, Intravenous, Q4H PRN, Mansy, Jan A, MD .  OLANZapine (ZYPREXA) tablet 10 mg, 10 mg, Oral, QHS, Mansy, Jan A, MD, 10 mg at 12/19/19 2312 .  ondansetron (ZOFRAN) tablet 4 mg, 4 mg, Oral, Q6H PRN **OR** ondansetron (ZOFRAN) injection 4 mg, 4 mg, Intravenous, Q6H PRN, Mansy, Jan A, MD .  ondansetron (ZOFRAN-ODT) disintegrating tablet 8 mg, 8 mg, Oral, Q8H PRN, Mansy, Jan A, MD .  oxyCODONE (ROXICODONE) 5 MG/5ML solution 5 mg, 5 mg, Oral, Q4H PRN, Mansy, Jan A, MD, 5 mg at 12/20/19 1630 .  pantoprazole (PROTONIX) EC tablet 40 mg, 40 mg, Oral, BID, Mansy, Jan A, MD, 40 mg at 12/20/19 0919 .  sennosides (SENOKOT) 8.8 MG/5ML syrup 10 mL,  10 mL, Oral, QHS, Mansy, Jan A, MD .  traZODone (DESYREL) tablet 25 mg, 25 mg, Oral, QHS PRN, Mansy, Arvella Merles, MD  Physical exam:  Vitals:   12/20/19 0527 12/20/19 0856 12/20/19 1100 12/20/19 1646  BP: 125/72 119/80 125/84 124/63  Pulse: 75 85 78 73  Resp: 19 17 18 14   Temp: 97.8 F (36.6 C) 98.3 F (36.8 C) 98.5 F (36.9 C) 98.5 F (36.9 C)  TempSrc: Oral  Oral Oral  SpO2: 100% 95% 96% 96%  Weight:      Height:       Physical Exam Constitutional:      Comments: Appears fatigued  Eyes:     Pupils: Pupils are equal, round, and reactive to light.  Cardiovascular:     Rate and Rhythm: Normal rate and regular rhythm.     Heart sounds: Normal heart sounds.  Pulmonary:     Effort: Pulmonary effort is normal.     Breath sounds: Normal breath sounds.  Abdominal:     General: Bowel sounds are normal.     Palpations: Abdomen is soft.     Comments: On continuous tube feeds at 60 ml /hr  Skin:    General: Skin is warm and dry.  Neurological:     Mental Status: He is alert and oriented to person, place, and time.      CMP Latest Ref Rng & Units 12/20/2019  Glucose 70 - 99 mg/dL 127(H)  BUN 6 - 20 mg/dL 24(H)  Creatinine 0.61 - 1.24  mg/dL 1.38(H)  Sodium 135 - 145 mmol/L 134(L)  Potassium 3.5 - 5.1 mmol/L 4.0  Chloride 98 - 111 mmol/L 98  CO2 22 - 32 mmol/L 29  Calcium 8.9 - 10.3 mg/dL 9.7  Total Protein 6.5 - 8.1 g/dL -  Total Bilirubin 0.3 - 1.2 mg/dL -  Alkaline Phos 38 - 126 U/L -  AST 15 - 41 U/L -  ALT 0 - 44 U/L -   CBC Latest Ref Rng & Units 12/20/2019  WBC 4.0 - 10.5 K/uL 1.5(L)  Hemoglobin 13.0 - 17.0 g/dL 10.0(L)  Hematocrit 39 - 52 % 30.4(L)  Platelets 150 - 400 K/uL 135(L)    @IMAGES @  NM PET Image Initial (PI) Skull Base To Thigh  Result Date: 11/23/2019 CLINICAL DATA:  Initial treatment strategy for metastatic esophageal adenocarcinoma. Known right scapular metastasis. EXAM: NUCLEAR MEDICINE PET SKULL BASE TO THIGH TECHNIQUE: 15.4 mCi F-18 FDG was injected intravenously. Full-ring PET imaging was performed from the skull base to thigh after the radiotracer. CT data was obtained and used for attenuation correction and anatomic localization. Fasting blood glucose: 121 mg/dl COMPARISON:  CT abdomen/pelvis dated 10/21/2019. MRI right shoulder dated 10/16/2019. FINDINGS: Mediastinal blood pool activity: SUV max 3.7 Liver activity: SUV max NA NECK: No hypermetabolic cervical lymphadenopathy. Incidental CT findings: none CHEST: Mass involving the lower 3rd of the esophagus (series 3/image 123), max SUV 15.0, corresponding to the patient's known primary esophageal neoplasm. 10 mm anterior left upper lobe nodule (series 3/image 85), max SUV 4.0, compatible with pulmonary metastasis. No hypermetabolic thoracic lymphadenopathy. Right chest port terminates in the lower SVC. Incidental CT findings: Mild atherosclerotic calcifications of the aortic arch. ABDOMEN/PELVIS: 3.0 cm right adrenal metastasis (series 3/image 152), max SUV 9.0. 8 mm short axis celiac axis node (series 3/image 149), max SUV 6.4. Incidental CT findings: Layering gallbladder sludge versus noncalcified gallstones. Mild atherosclerotic calcifications  of the abdominal aorta and branch vessels. SKELETON: Widespread osseous metastases throughout the  visualized axial and appendicular skeleton, including: --Destructive right scapular mass, max SUV 7.7 --Right sternum, max SUV 7.4 --Right 5th costosternal junction, max SUV 7.1 --T12 vertebral body, max SUV 12.4 --Destructive left iliac bone mass, max SUV 11.4 --Right proximal femoral shaft, max SUV 5.4 Incidental CT findings: Degenerative changes of the visualized thoracolumbar spine. IMPRESSION: Distal esophageal mass, corresponding to the patient's known primary esophageal neoplasm. 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 Widespread osseous metastases, including destructive lesions in the right scapula and left iliac bone, as above. Electronically Signed   By: Julian Hy M.D.   On: 11/23/2019 11:43   DG Chest Portable 1 View  Result Date: 12/19/2019 CLINICAL DATA:  Weakness. EXAM: PORTABLE CHEST 1 VIEW COMPARISON:  October 10, 2018.  PET-CT dated 11/23/2019 FINDINGS: There is a well-positioned right-sided Port-A-Cath. Again noted is a lytic lesion involving the right glenoid. There is no pneumothorax. No large pleural effusion. There is some atelectasis at the lung bases. There is a lytic lesion involving the posterior eighth rib on the right. The heart size is unremarkable. IMPRESSION: 1. No acute cardiopulmonary process. 2. Well-positioned right-sided Port-A-Cath. 3. Lytic lesion involving the right glenoid and right eighth rib. Electronically Signed   By: Constance Holster M.D.   On: 12/19/2019 02:37   DG Abd 2 Views  Result Date: 12/19/2019 CLINICAL DATA:  Generalized weakness EXAM: ABDOMEN - 2 VIEW COMPARISON:  None. FINDINGS: The bowel gas pattern is normal. Moderate amount of right colonic stool is present. There is no evidence of free air. No radio-opaque calculi or other significant radiographic abnormality is seen. IMPRESSION:  Nonobstructive bowel gas pattern. Electronically Signed   By: Prudencio Pair M.D.   On: 12/19/2019 16:30     Assessment and plan- Patient is a 59 y.o. 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  Nausea/vomiting: Patient has had problems tolerating his tube feeds and unable to swallow due to obstruction by esophageal mass.Patient has had nausea even before chemo. I will obtain mri brain to rule out central cause of nausea.     He was supposed to start palliative radiation treatment to his esophageal mass which is currently on hold.  Continue Reglan and olanzapine for nausea.  Symptomatically patient appears to be improving  2.  Pancytopenia: Anemia and thrombocytopenia stable.  He did receive your any with last cycle of chemotherapy and white cell count is likely to recover in the next few days.  Continue to monitor.  Reasonable to continue ciprofloxacin prophylaxis until white count normalizes  3.  Generalized weakness: Likely secondary to underlying stage IV esophageal cancer as well as ongoing treatments.  Will likely need rehab upon discharge.  Palliative care has also seen the patient.  It would be reasonable to continue with current scope of active treatment.  Patient had a CPS score of 20 indicating good response to immunotherapy and is currently on first-line treatment.  4.  Neoplasm related pain: Continue as needed oxycodone and fentanyl patch.   Visit Diagnosis 1. Weakness   2. Neutropenia, unspecified type ([place])   3. AKI (acute kidney injury) ([place])   4. Nausea with vomiting      Dr. Randa Evens, MD, MPH [facility] at [facility] [phone] 12/20/2019 10:00 PM

## 2019-12-20 NOTE — Consult Note (Signed)
[place]  [phone] [phone]   Name: Jacob Henson Date: 12/20/2019 MRN: [id number]  DOB: November 23, 1960  Patient Care Team: Elby Beck, FNP as PCP - General (Nurse Practitioner) Clent Jacks, RN as Oncology Nurse Navigator Sindy Guadeloupe, MD as Consulting Physician (Oncology)    REASON FOR CONSULTATION: Jacob Henson is a 59 y.o. 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  Palliative care was consulted over address goals and manage ongoing symptoms.  SOCIAL HISTORY:     reports that he quit smoking about 41 years ago. His smoking use included cigarettes. He has a 4.00 pack-year smoking history. He has never used smokeless tobacco. He reports that he does not drink alcohol and does not use drugs.  Patient is married and lives at home with his wife two dogs.  He has no children.  He formally worked in Teacher, adult education.  ADVANCE DIRECTIVES:  Does not have  CODE STATUS: Full code  PAST MEDICAL HISTORY: Past Medical History:  Diagnosis Date  . Allergy   . Cancer ([place])   . Esophageal cancer ([place])   . GERD (gastroesophageal reflux disease)    Has required esophageal dilation in past  . Hypertension     PAST SURGICAL HISTORY:  Past Surgical History:  Procedure Laterality Date  . ESOPHAGEAL DILATION    . PEG TUBE PLACEMENT  11/30/2019  . PORTA CATH INSERTION N/A 11/19/2019   Procedure: PORTA CATH INSERTION;  Surgeon: Algernon Huxley, MD;  Location: [facility];  Service: Cardiovascular;  Laterality: N/A;    HEMATOLOGY/ONCOLOGY HISTORY:  Oncology History  Esophageal adenocarcinoma ([place])  11/15/2019 Initial Diagnosis   Esophageal adenocarcinoma ([place])   11/22/2019 -   Chemotherapy   The patient had dexamethasone (DECADRON) 4 MG tablet, 8 mg, Oral, Daily, 1 of 1 cycle, Start date: --, End date: -- palonosetron (ALOXI) injection 0.25 mg, 0.25 mg, Intravenous,  Once, 2 of 6 cycles Administration: 0.25 mg (11/22/2019), 0.25 mg (12/07/2019) pegfilgrastim-cbqv (UDENYCA) injection 6 mg, 6 mg, Subcutaneous, Once, 1 of 5 cycles Administration: 6 mg (12/09/2019) leucovorin 1,000 mg in dextrose 5 % 250 mL infusion, 383 mg/m2 = 1,044 mg, Intravenous,  Once, 2 of 6 cycles Administration: 1,000 mg (11/22/2019), 1,000 mg (12/07/2019) oxaliplatin (ELOXATIN) 170 mg in dextrose 5 % 500 mL chemo infusion, 65 mg/m2 = 170 mg (100 % of original dose 65 mg/m2), Intravenous,  Once, 2 of 6 cycles Dose modification: 65 mg/m2 (original dose 65 mg/m2, Cycle 1, Reason: Other (see comments), Comment: Minimize toxicity in palliative setting) Administration: 170 mg (11/22/2019), 170 mg (12/07/2019) fluorouracil (ADRUCIL) chemo injection 1,000 mg, 383 mg/m2 = 1,050 mg, Intravenous,  Once, 2 of 6 cycles Administration: 1,000 mg (11/22/2019), 1,000 mg (12/07/2019) pembrolizumab (KEYTRUDA) 400 mg in sodium chloride 0.9 % 50 mL chemo infusion, 400 mg (100 % of original dose 400 mg), Intravenous, Once, 1 of 2 cycles Dose modification: 400 mg (original dose 400 mg, Cycle 2) Administration: 400 mg (12/09/2019) fluorouracil (ADRUCIL) 6,250 mg in sodium chloride 0.9 % 125 mL chemo infusion, 2,400 mg/m2 = 6,250 mg, Intravenous, 1 Day/Dose, 2 of 6 cycles Administration: 6,250 mg (11/22/2019), 6,250 mg (12/07/2019)  for chemotherapy treatment.      ALLERGIES:  has No Known Allergies.  MEDICATIONS:  Current  Facility-Administered Medications  Medication Dose Route Frequency Provider Last Rate Last Admin  . acetaminophen (TYLENOL) tablet 650 mg  650 mg Oral Q6H PRN Mansy, Jan A, MD       Or  . acetaminophen (TYLENOL) suppository 650 mg  650 mg Rectal Q6H PRN Mansy, Jan A, MD      . chlorhexidine  (PERIDEX) 0.12 % solution 15 mL  15 mL Mouth Rinse BID Lorella Nimrod, MD   15 mL at 12/20/19 0918  . Chlorhexidine Gluconate Cloth 2 % PADS 6 each  6 each Topical Daily Dessa Phi, DO   6 each at 12/20/19 0932  . ciprofloxacin (CIPRO) tablet 500 mg  500 mg Oral BID Earlie Server, MD   500 mg at 12/20/19 2694  . enoxaparin (LOVENOX) injection 65 mg  65 mg Subcutaneous Q24H Mansy, Jan A, MD   65 mg at 12/20/19 0919  . feeding supplement (OSMOLITE 1.5 CAL) liquid 1,000 mL  1,000 mL Per Tube Continuous Dessa Phi, DO 80 mL/hr at 12/20/19 1530 1,000 mL at 12/20/19 1530  . feeding supplement (PROSource TF) liquid 45 mL  45 mL Per Tube BID Dessa Phi, DO   45 mL at 12/20/19 1203  . fentaNYL (DURAGESIC) 12 MCG/HR 1 patch  1 patch Transdermal Q72H Mansy, Jan A, MD      . free water 200 mL  200 mL Per Tube Q4H Dessa Phi, DO   200 mL at 12/20/19 1400  . influenza vac split quadrivalent PF (FLUARIX) injection 0.5 mL  0.5 mL Intramuscular Tomorrow-1000 Dessa Phi, DO      . lidocaine-prilocaine (EMLA) cream 1 application  1 application Topical Once Mansy, Jan A, MD      . magnesium hydroxide (MILK OF MAGNESIA) suspension 30 mL  30 mL Oral Daily PRN Mansy, Jan A, MD      . MEDLINE mouth rinse  15 mL Mouth Rinse q12n4p Lorella Nimrod, MD   15 mL at 12/19/19 2317  . metoCLOPramide (REGLAN) 5 MG/5ML solution 5 mg  5 mg Oral TID AC BelueAlver Sorrow, RPH   5 mg at 12/20/19 1204  . morphine 2 MG/ML injection 2 mg  2 mg Intravenous Q4H PRN Mansy, Jan A, MD      . OLANZapine (ZYPREXA) tablet 10 mg  10 mg Oral QHS Mansy, Jan A, MD   10 mg at 12/19/19 2312  . ondansetron (ZOFRAN) tablet 4 mg  4 mg Oral Q6H PRN Mansy, Jan A, MD       Or  . ondansetron [facility]) injection 4 mg  4 mg Intravenous Q6H PRN Mansy, Jan A, MD      . ondansetron (ZOFRAN-ODT) disintegrating tablet 8 mg  8 mg Oral Q8H PRN Mansy, Jan A, MD      . oxyCODONE (ROXICODONE) 5 MG/5ML solution 5 mg  5 mg Oral Q4H PRN Mansy, Jan A, MD   5 mg at  12/20/19 0925  . pantoprazole (PROTONIX) EC tablet 40 mg  40 mg Oral BID Mansy, Jan A, MD   40 mg at 12/20/19 0919  . sennosides (SENOKOT) 8.8 MG/5ML syrup 10 mL  10 mL Oral QHS Mansy, Jan A, MD      . traZODone (DESYREL) tablet 25 mg  25 mg Oral QHS PRN Mansy, Jan A, MD        VITAL SIGNS: BP 125/84 (BP Location: Left Arm)   Pulse 78   Temp 98.5 F (36.9 C) (Oral)   Resp 18   Ht  _0  (1.956 m)   Wt 285 lb (129.3 kg)   SpO2 96%   BMI 33.80 kg/m  Filed Weights   12/19/19 0139  Weight: 285 lb (129.3 kg)    Estimated body mass index is 33.8 kg/m as calculated from the following:   Height as of this encounter: _1  (1.956 m).   Weight as of this encounter: 285 lb (129.3 kg).  LABS: CBC:    Component Value Date/Time   WBC 1.5 (L) 12/20/2019 0454   HGB 10.0 (L) 12/20/2019 0454   HCT 30.4 (L) 12/20/2019 0454   PLT 135 (L) 12/20/2019 0454   MCV 93.0 12/20/2019 0454   NEUTROABS 0.2 (LL) 12/19/2019 0453   LYMPHSABS 0.5 (L) 12/19/2019 0453   MONOABS 0.5 12/19/2019 0453   EOSABS 0.0 12/19/2019 0453   BASOSABS 0.0 12/19/2019 0453   Comprehensive Metabolic Panel:    Component Value Date/Time   NA 134 (L) 12/20/2019 0454   NA 134 (A) 10/20/2019 0000   K 4.0 12/20/2019 0454   CL 98 12/20/2019 0454   CO2 29 12/20/2019 0454   BUN 24 (H) 12/20/2019 0454   BUN 29 (A) 10/20/2019 0000   CREATININE 1.38 (H) 12/20/2019 0454   GLUCOSE 127 (H) 12/20/2019 0454   CALCIUM 9.7 12/20/2019 0454   AST 37 12/19/2019 0445   ALT 44 12/19/2019 0445   ALKPHOS 78 12/19/2019 0445   BILITOT 1.1 12/19/2019 0445   PROT 6.0 (L) 12/19/2019 0445   ALBUMIN 2.5 (L) 12/19/2019 0445    RADIOGRAPHIC STUDIES: NM PET Image Initial (PI) Skull Base To Thigh  Result Date: 11/23/2019 CLINICAL DATA:  Initial treatment strategy for metastatic esophageal adenocarcinoma. Known right scapular metastasis. EXAM: NUCLEAR MEDICINE PET SKULL BASE TO THIGH TECHNIQUE: 15.4 mCi F-18 FDG was injected intravenously.  Full-ring PET imaging was performed from the skull base to thigh after the radiotracer. CT data was obtained and used for attenuation correction and anatomic localization. Fasting blood glucose: 121 mg/dl COMPARISON:  CT abdomen/pelvis dated 10/21/2019. MRI right shoulder dated 10/16/2019. FINDINGS: Mediastinal blood pool activity: SUV max 3.7 Liver activity: SUV max NA NECK: No hypermetabolic cervical lymphadenopathy. Incidental CT findings: none CHEST: Mass involving the lower 3rd of the esophagus (series 3/image 123), max SUV 15.0, corresponding to the patient's known primary esophageal neoplasm. 10 mm anterior left upper lobe nodule (series 3/image 85), max SUV 4.0, compatible with pulmonary metastasis. No hypermetabolic thoracic lymphadenopathy. Right chest port terminates in the lower SVC. Incidental CT findings: Mild atherosclerotic calcifications of the aortic arch. ABDOMEN/PELVIS: 3.0 cm right adrenal metastasis (series 3/image 152), max SUV 9.0. 8 mm short axis celiac axis node (series 3/image 149), max SUV 6.4. Incidental CT findings: Layering gallbladder sludge versus noncalcified gallstones. Mild atherosclerotic calcifications of the abdominal aorta and branch vessels. SKELETON: Widespread osseous metastases throughout the visualized axial and appendicular skeleton, including: --Destructive right scapular mass, max SUV 7.7 --Right sternum, max SUV 7.4 --Right 5th costosternal junction, max SUV 7.1 --T12 vertebral body, max SUV 12.4 --Destructive left iliac bone mass, max SUV 11.4 --Right proximal femoral shaft, max SUV 5.4 Incidental CT findings: Degenerative changes of the visualized thoracolumbar spine. IMPRESSION: Distal esophageal mass, corresponding to the patient's known primary esophageal neoplasm. 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 Widespread osseous metastases, including destructive lesions in the right scapula and left iliac bone, as  above. Electronically Signed   By: Julian Hy M.D.   On: 11/23/2019 11:43   DG Chest  Portable 1 View  Result Date: 12/19/2019 CLINICAL DATA:  Weakness. EXAM: PORTABLE CHEST 1 VIEW COMPARISON:  October 10, 2018.  PET-CT dated 11/23/2019 FINDINGS: There is a well-positioned right-sided Port-A-Cath. Again noted is a lytic lesion involving the right glenoid. There is no pneumothorax. No large pleural effusion. There is some atelectasis at the lung bases. There is a lytic lesion involving the posterior eighth rib on the right. The heart size is unremarkable. IMPRESSION: 1. No acute cardiopulmonary process. 2. Well-positioned right-sided Port-A-Cath. 3. Lytic lesion involving the right glenoid and right eighth rib. Electronically Signed   By: Constance Holster M.D.   On: 12/19/2019 02:37   DG Abd 2 Views  Result Date: 12/19/2019 CLINICAL DATA:  Generalized weakness EXAM: ABDOMEN - 2 VIEW COMPARISON:  None. FINDINGS: The bowel gas pattern is Jacob. Moderate amount of right colonic stool is present. There is no evidence of free air. No radio-opaque calculi or other significant radiographic abnormality is seen. IMPRESSION: Nonobstructive bowel gas pattern. Electronically Signed   By: Prudencio Pair M.D.   On: 12/19/2019 16:30    PERFORMANCE STATUS (ECOG) : 3 - Symptomatic, >50% confined to bed  Review of Systems Unless otherwise noted, a complete review of systems is negative.  Physical Exam General: NAD Pulmonary: Unlabored Extremities: no edema, no joint deformities Skin: no rashes Neurological: Weakness but otherwise nonfocal  IMPRESSION: I met with patient and wife.  Patient reports that he is feeling better today.  He denies nausea at present.  He says has had occasional nausea today but overall it is much improved from baseline.  He reports stable pain.  At home, he was not tolerating tube feeds.  He was rotated to continuous TF's without significant relief.  He was tried on multiple  antiemetics including Zofran, Compazine, olanzapine and metoclopramide without significant improvement in symptom burden.  Currently, he is on metoclopramide and olanzapine.  He received IV ondansetron yesterday.  He appears to be currently tolerating TF's.  Pain is reportedly stable on transdermal fentanyl with as needed oxycodone.  Would recommend continuation of his current antiemetic regimen.  If nausea worsens, could consider switching to twice daily dosing of olanzapine versus rotating to scheduled haloperidol.  Other options could include increasing dose of metoclopramide, scheduled ondansetron, lorazepam, and steroids.  Patient says that he was functionally independent at home.  He is now profoundly weak.  PT is recommending rehab.  Patient states that he would prefer home health but did not seem entirely opposed to the idea of rehab.  His wife seemed more interested in the idea of rehab.  Patient and wife are completing ACP documents with assistance of chaplain.  Patient states that he wants to remain a full code for now.  Patient says that his goals are aligned with continued treatment if possible.  PLAN: -Continue current scope of treatment -Agree with current antiemetic regimen -Agree with current pain regimen -ACP documents per chaplain -He will benefit from future MOST form -Will plan to follow-up in the clinic   Patient expressed understanding and was in agreement with this plan. He also understands that He can call the clinic at any time with any questions, concerns, or complaints.     Time Total: 60 minutes  Visit consisted of counseling and education dealing with the complex and emotionally intense issues of symptom management and palliative care in the setting of serious and potentially life-threatening illness.Greater than 50%  of this time was spent counseling and coordinating care related to the  above assessment and plan.  Signed by: Altha Harm, PhD, NP-C

## 2019-12-20 NOTE — Progress Notes (Signed)
PROGRESS NOTE    Jacob Henson  NKN:[id number] DOB: 05/16/1960 DOA: 12/19/2019 PCP: Elby Beck, FNP     Brief Narrative:  Jacob Henson is a 59 y.o. 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  Since yesterday, the patient was not able to get out of bed.  He admitted to nausea and  vomiting without diarrhea or abdominal pain.  No bilious vomitus or hematemesis.  He has been having diminished appetite. Patient was admitted for weakness.   New events last 24 hours / Subjective: Tolerating tube feeding, which is currently running a 50 mL/hr.  Working with physical therapy and up to recliner this morning.  No physical complaints other than generalized weakness.  Assessment & Plan:   Active Problems:   Weakness   AKI (acute kidney injury) (HCC)   Nausea and vomiting   Malignant neoplasm of overlapping sites of esophagus (HCC)   Anemia   Thrombocytopenia (HCC)   Neutropenia ([facility])   Generalized weakness likely secondary to chemotherapy as well as mild acute kidney injury and dehydration -PT OT eval, will need SNF placement.  TOC consulted  Hyponatremia likely hypovolemic due to volume depletion and anorexia -IVF and monitor BMP  -Improving  Stage IV esophageal cancer on active radiotherapy and chemotherapy -Oncology consulted -Tube feeding management per dietitian -Continue chronic pain medications  Pancytopenia -Likely secondary to chemotherapy.  Continue to monitor CBC and fevers, bleeding  CKD stage IIIa -Remains stable  Elevated troponin secondary to demand ischemia -Trend has been flat 22 --> 18  Depression -Continue Zyprexa   DVT prophylaxis:  Lovenox   Code Status: Full Family Communication: No family at bedside Disposition Plan:  Status is: Inpatient  Remains inpatient appropriate because:Unsafe d/c plan   Dispo: The patient is from: Home              Anticipated d/c is to: SNF              Anticipated d/c date is: 2 days              Patient currently is not medically stable to d/c. Remains very weak.  SNF placement needed.  Oncology consulted.   Consultants:   Oncology  Procedures:   None   Antimicrobials:  Anti-infectives (From admission, onward)   Start     Dose/Rate Route Frequency Ordered Stop   12/19/19 2000  ciprofloxacin (CIPRO) tablet 500 mg        500 mg Oral 2 times daily 12/19/19 1508         Objective: Vitals:   12/19/19 2012 12/20/19 0107 12/20/19 0527 12/20/19 0856  BP: 112/61 117/87 125/72 119/80  Pulse: 82 (!) 121 75 85  Resp: 20  19 17   Temp: (!) 97.5 F (36.4 C) 98.9 F (37.2 C) 97.8 F (36.6 C) 98.3 F (36.8 C)  TempSrc: Oral Oral Oral   SpO2: 97% 95% 100% 95%  Weight:      Height:        Intake/Output Summary (Last 24 hours) at 12/20/2019 1011 Last data filed at 12/20/2019 0700 Gross per 24 hour  Intake 1066.45 ml  Output 800 ml  Net 266.45 ml   [employer]  12/19/19 0139  Weight: 129.3 kg    Examination: General exam: Appears calm and comfortable  Respiratory system: Clear to auscultation. Respiratory effort normal. Cardiovascular system: S1 & S2 heard, irregular rhythm (previous EKGs with PVC, PAC). No pedal edema. Gastrointestinal system: Abdomen is nondistended, soft and nontender. Normal bowel sounds heard. Central nervous system: Alert and oriented. Non focal exam. Speech clear  Extremities: Symmetric in appearance bilaterally  Skin: No rashes, lesions or ulcers on exposed skin  Psychiatry: Judgement and insight appear stable. Mood & affect appropriate.   Data Reviewed: I have personally reviewed following labs and imaging studies  CBC: Recent Labs   Lab 12/19/19 0145 12/19/19 0150 12/19/19 0445 12/19/19 0453 12/20/19 0454  WBC 1.0* 1.0* 1.2* 1.2* 1.5*  NEUTROABS  --  0.1*  --  0.2*  --   HGB 11.2* 11.4* 10.7* 10.7* 10.0*  HCT 34.4* 34.3* 32.4* 33.6* 30.4*  MCV 93.2 93.5 93.4 95.2 93.0  PLT 120* 130* 131* 127* 657*   Basic Metabolic Panel: Recent Labs  Lab 12/19/19 0145 12/19/19 0445 12/20/19 0454  NA 131* 131* 134*  K 4.4 4.2 4.0  CL 91* 93* 98  CO2 30 32 29  GLUCOSE 116* 132* 127*  BUN 24* 23* 24*  CREATININE 1.48* 1.44* 1.38*  CALCIUM 9.8 9.6 9.7  MG  --   --  1.9  PHOS  --   --  2.6   GFR: Estimated Creatinine Clearance: 85.8 mL/min (A) (by C-G formula based on SCr of 1.38 mg/dL (H)). Liver Function Tests: Recent Labs  Lab 12/19/19 0145 12/19/19 0445  AST 39 37  ALT 47* 44  ALKPHOS 84 78  BILITOT 1.1 1.1  PROT 6.4* 6.0*  ALBUMIN 2.7* 2.5*   No results for input(s): LIPASE, AMYLASE in the last 168 hours. No results for input(s): AMMONIA in the last 168 hours. Coagulation Profile: Recent Labs  Lab 12/19/19 0445  INR 1.3*   Cardiac Enzymes: No results for input(s): CKTOTAL, CKMB, CKMBINDEX, TROPONINI in the last 168 hours. BNP (last 3 results) No results for input(s): PROBNP in the last 8760 hours. HbA1C: No results for input(s): HGBA1C in the last 72 hours. CBG: No results for input(s): GLUCAP in the last 168 hours. Lipid Profile: No results for input(s): CHOL, HDL, LDLCALC, TRIG, CHOLHDL, LDLDIRECT in the last 72 hours. Thyroid Function Tests: No results for input(s): TSH, T4TOTAL, FREET4, T3FREE, THYROIDAB in the last 72 hours. Anemia Panel: No results for input(s): VITAMINB12, FOLATE, FERRITIN, TIBC, IRON, RETICCTPCT in the last 72 hours. Sepsis Labs: No results for input(s): PROCALCITON, LATICACIDVEN in the last 168 hours.  Recent Results (from the past 240 hour(s))  Respiratory Panel by RT PCR (Flu A&B, Covid) - Nasopharyngeal Swab     Status: None   Collection Time: 12/19/19  1:50  AM   Specimen: Nasopharyngeal Swab  Result Value Ref Range Status   SARS Coronavirus 2 by RT PCR NEGATIVE NEGATIVE Final    Comment: (NOTE) SARS-CoV-2 target nucleic acids are NOT DETECTED.  The SARS-CoV-2 RNA is generally detectable in upper respiratoy specimens during the acute phase of infection. The lowest concentration of SARS-CoV-2 viral copies this assay can detect is 131 copies/mL. A negative result does not preclude SARS-Cov-2 infection and should not be used as the sole basis for treatment or other patient management decisions. A negative result may occur with  improper specimen collection/handling, submission of specimen other than nasopharyngeal swab, presence of viral mutation(s) within the areas targeted by this assay, and  inadequate number of viral copies (<131 copies/mL). A negative result must be combined with clinical observations, patient history, and epidemiological information. The expected result is Negative.  Fact Sheet for Patients:  PinkCheek.be  Fact Sheet for Healthcare Providers:  GravelBags.it  This test is no t yet approved or cleared by the [place] FDA and  has been authorized for detection and/or diagnosis of SARS-CoV-2 by FDA under an Emergency Use Authorization (EUA). This EUA will remain  in effect (meaning this test can be used) for the duration of the COVID-19 declaration under Section 564(b)(1) of the Act, 21 U.S.C. section 360bbb-3(b)(1), unless the authorization is terminated or revoked sooner.     Influenza A by PCR NEGATIVE NEGATIVE Final   Influenza B by PCR NEGATIVE NEGATIVE Final    Comment: (NOTE) The Xpert Xpress SARS-CoV-2/FLU/RSV assay is intended as an aid in  the diagnosis of influenza from Nasopharyngeal swab specimens and  should not be used as a sole basis for treatment. Nasal washings and  aspirates are unacceptable for Xpert Xpress SARS-CoV-2/FLU/RSV   testing.  Fact Sheet for Patients: PinkCheek.be  Fact Sheet for Healthcare Providers: GravelBags.it  This test is not yet approved or cleared by the [place] FDA and  has been authorized for detection and/or diagnosis of SARS-CoV-2 by  FDA under an Emergency Use Authorization (EUA). This EUA will remain  in effect (meaning this test can be used) for the duration of the  Covid-19 declaration under Section 564(b)(1) of the Act, 21  U.S.C. section 360bbb-3(b)(1), unless the authorization is  terminated or revoked. Performed at [facility], [address], [place], [place] [address]       Radiology Studies: DG Chest Portable 1 View  Result Date: 12/19/2019 CLINICAL DATA:  Weakness. EXAM: PORTABLE CHEST 1 VIEW COMPARISON:  October 10, 2018.  PET-CT dated 11/23/2019 FINDINGS: There is a well-positioned right-sided Port-A-Cath. Again noted is a lytic lesion involving the right glenoid. There is no pneumothorax. No large pleural effusion. There is some atelectasis at the lung bases. There is a lytic lesion involving the posterior eighth rib on the right. The heart size is unremarkable. IMPRESSION: 1. No acute cardiopulmonary process. 2. Well-positioned right-sided Port-A-Cath. 3. Lytic lesion involving the right glenoid and right eighth rib. Electronically Signed   By: Constance Holster M.D.   On: 12/19/2019 02:37   DG Abd 2 Views  Result Date: 12/19/2019 CLINICAL DATA:  Generalized weakness EXAM: ABDOMEN - 2 VIEW COMPARISON:  None. FINDINGS: The bowel gas pattern is normal. Moderate amount of right colonic stool is present. There is no evidence of free air. No radio-opaque calculi or other significant radiographic abnormality is seen. IMPRESSION: Nonobstructive bowel gas pattern. Electronically Signed   By: Prudencio Pair M.D.   On: 12/19/2019 16:30      Scheduled Meds: . chlorhexidine  15 mL Mouth Rinse BID  .  Chlorhexidine Gluconate Cloth  6 each Topical Daily  . ciprofloxacin  500 mg Oral BID  . enoxaparin (LOVENOX) injection  65 mg Subcutaneous Q24H  . feeding supplement (PROSource TF)  45 mL Per Tube BID  . fentaNYL  1 patch Transdermal Q72H  . free water  30 mL Per Tube Q4H  . influenza vac split quadrivalent PF  0.5 mL Intramuscular Tomorrow-1000  . lidocaine-prilocaine  1 application Topical Once  . mouth rinse  15 mL Mouth Rinse q12n4p  . metoCLOPramide  5 mg Oral TID AC  . OLANZapine  10 mg Oral QHS  .  pantoprazole  40 mg Oral BID  . sennosides  10 mL Oral QHS   Continuous Infusions: . sodium chloride 100 mL/hr at 12/20/19 0300  . feeding supplement (OSMOLITE 1.5 CAL) 1,000 mL (12/19/19 1815)     LOS: 1 day      Time spent: 25 minutes   Dessa Phi, DO [facility] 12/20/2019, 10:11 AM   Available via Epic secure chat 7am-7pm After these hours, please refer to coverage provider listed on [email or web address]

## 2019-12-20 NOTE — TOC Initial Note (Signed)
Transition of Care [facility]) - Initial/Assessment Note    Patient Details  Name: Jacob Henson MRN: [id number] Date of Birth: [birth year]  Transition of Care [facility] CM/SW Contact:    Magnus Ivan, LCSW Phone Number: 12/20/2019, 2:15 PM  Clinical Narrative:                CSW spoke with patient for Readmission Screen and to discuss PT recommending SNF. Patient lives with wife. Family provides transportation and he also uses Higher education careers adviser at times. PCP is Clarene Reamer. Pharmacy is [facility]. No DME, HH, or SNF history. Patient reported he would prefer to return home with Home Health services than go to SNF for rehab. Explained agency options, patient denied having an agency preference. CSW reached out to [place] [facility] to see if they can accept patient for services. Patient reported CSW will need to check with his wife about 3 in 1 recommendation if patient decides to go home.    Expected Discharge Plan: [place] Barriers to Discharge: Continued Medical Work up   Patient Goals and CMS Choice Patient states their goals for this hospitalization and ongoing recovery are:: home with home health CMS Medicare.gov Compare Post Acute Care list provided to:: Patient Choice offered to / list presented to : Patient  Expected Discharge Plan and Services Expected Discharge Plan: [place]       Living arrangements for the past 2 months: Single Family Home                                      Prior Living Arrangements/Services Living arrangements for the past 2 months: Single Family Home Lives with:: Spouse Patient language and need for interpreter reviewed:: Yes Do you feel safe going back to the place where you live?: Yes      Need for Family Participation in Patient Care: Yes (Comment) Care giver support system in place?: Yes (comment)   Criminal Activity/Legal Involvement Pertinent to Current Situation/Hospitalization:  No - Comment as needed  Activities of Daily Living Home Assistive Devices/Equipment: Feeding equipment ADL Screening (condition at time of admission) Patient's cognitive ability adequate to safely complete daily activities?: Yes Is the patient deaf or have difficulty hearing?: No Does the patient have difficulty seeing, even when wearing glasses/contacts?: No Does the patient have difficulty concentrating, remembering, or making decisions?: No Patient able to express need for assistance with ADLs?: Yes Does the patient have difficulty dressing or bathing?: Yes Independently performs ADLs?: No Communication: Independent Dressing (OT): Needs assistance Is this a change from baseline?: Pre-admission baseline Grooming: Needs assistance Is this a change from baseline?: Pre-admission baseline Feeding: Needs assistance Is this a change from baseline?: Pre-admission baseline Bathing: Needs assistance Is this a change from baseline?: Pre-admission baseline Toileting: Needs assistance Is this a change from baseline?: Pre-admission baseline In/Out Bed: Needs assistance Is this a change from baseline?: Pre-admission baseline Walks in Home: Independent Does the patient have difficulty walking or climbing stairs?: Yes Weakness of Legs: Both Weakness of Arms/Hands: Right  Permission Sought/Granted Permission sought to share information with : Facility Art therapist granted to share information with : Yes, Verbal Permission Granted     Permission granted to share info w AGENCY: Home Health & DME agencies        Emotional Assessment Appearance:: Appears stated age Attitude/Demeanor/Rapport: Engaged Affect (typically observed): Accepting,  Appropriate Orientation: : Oriented to Situation, Oriented to  Time, Oriented to Place, Oriented to Self Alcohol / Substance Use: Not Applicable Psych Involvement: No (comment)  Admission diagnosis:  Weakness [R53.1] AKI (acute  kidney injury) ([place]) [N17.9] Neutropenia, unspecified type ([place]) [D70.9] Patient Active Problem List   Diagnosis Date Noted  . Weakness 12/19/2019  . AKI (acute kidney injury) ([place])   . Nausea and vomiting   . Malignant neoplasm of overlapping sites of esophagus ([place])   . Anemia   . Thrombocytopenia ([place])   . Neutropenia ([place])   . Palliative care patient 11/30/2019  . Goals of care, counseling/discussion 11/15/2019  . Esophageal adenocarcinoma ([place]) 11/15/2019  . Mass of joint of right shoulder 10/28/2019  . Essential hypertension 10/28/2019  . GERD (gastroesophageal reflux disease) 10/28/2019   PCP:  Elby Beck, FNP [id number] [place] [address] [place] [address] Phone: [phone] Fax: [phone]     Social Determinants of Health (SDOH) Interventions    Readmission Risk Interventions No flowsheet data found.

## 2019-12-20 NOTE — Progress Notes (Signed)
[place] visited w/pt. and wife briefly after being paged to 1C to assist w/AD; [place] explained that notarization later in the afternoon is difficult but that chaplains will follow up tomorrow AM with notary and witnesses.  Family grateful; [place] will follow up.

## 2019-12-20 NOTE — Evaluation (Signed)
Physical Therapy Evaluation Patient Details Name: Jacob Henson MRN: [id number] DOB: 1960/12/06 Today's Date: 12/20/2019   History of Present Illness  59 y.o. male with PMH most significant for metastatic esophageal adenocarcinoma with bone metastasis, on active chemotherapy who presents to ER for progressive generalized weakness, nausea and vomiting after tube feeding  Clinical Impression  Pt received lying in bed with HOB elevated. Pt agreeable to participate in PT evaluation. Pt reporting RUE pain at rest along with numbness/tingling from R clavicle and distally down RUE. Pt also stated having some R hip pain. Pt reported independence with ambulation and transfers up until a week before admission due to progressive weakness. LUE with good strength and BLE with generalized weakness however pt is able to perform active SLR against gravity with minimal extensor lag. Pt able to self initiate supine to sit transfer however required mod A for lifting trunk to come to sitting edge of bed. Pt attempted to adjust his hips which resulted in posterior LOB requiring mod A to regain balance. Height of bed elevated greatly secondary to pt's height and weakness. Pt utilized LUE on hemiwalker and able to stand with min A. Pt ambulated 2 feet to recliner chair and noted difficulty advancing RLE requiring verbal cues for increased step length. PT sat to low recliner chair with mod A for controlled lowering. Pt then perfored BLE therex and required min A for SLR in long sitting position. Pt presents with deficits in pain, strength, functional activity tolerance, balance, and functional mobility. Pt would benefit from skilled PT to address deficits and STR at discharge to maximize safety and independence with functional mobility and decreased falls risk.    Follow Up Recommendations SNF;Supervision for mobility/OOB    Equipment Recommendations  Other (comment) (hemiwalker with tall adjustment setting: pt is 6'5")     Recommendations for Other Services       Precautions / Restrictions Precautions Precautions: Fall Restrictions Other Position/Activity Restrictions: RUE very painful at rest due to cancerous mass near R shoulder      Mobility  Bed Mobility Overal bed mobility: Needs Assistance Bed Mobility: Supine to Sit     Supine to sit: Mod assist;HOB elevated     General bed mobility comments: Pt self-initiated [place] to sit from elevated HOB however unable to come into full upright sitting requiring mod A    Transfers Overall transfer level: Needs assistance Equipment used: Hemi-walker (on L side) Transfers: Sit to/from Stand Sit to Stand: Min assist;From elevated surface         General transfer comment: Pt able to stand from elevated height of bed with min A and usage of hemiwalker on L side; mod A for eccentric control on descent to low recliner chair  Ambulation/Gait Ambulation/Gait assistance: Min assist Gait Distance (Feet): 2 Feet Assistive device: Hemi-walker Gait Pattern/deviations: Decreased step length - right Gait velocity: decreased   General Gait Details: pt sidestepped from bed to recliner chair with min A for steadying; pt with difficulty advancing RLE requiring increased verbal cues for longer step length  Stairs            Wheelchair Mobility    Modified Rankin (Stroke Patients Only)       Balance Overall balance assessment: Needs assistance Sitting-balance support: Single extremity supported;Feet supported Sitting balance-Leahy Scale: Fair Sitting balance - Comments: pt with posterior LOB after adjusting hips to see if he was sitting on something and required mod A to recover balance   Standing balance support: Single  extremity supported Standing balance-Leahy Scale: Fair Standing balance comment: reliant on hemiwalker with LUE and min A for steadying and safety                             Pertinent Vitals/Pain Pain  Assessment: Faces Faces Pain Scale: Hurts even more Pain Location: R shoulder and arm Pain Descriptors / Indicators: Discomfort;Grimacing Pain Intervention(s): Limited activity within patient's tolerance;Monitored during session;Repositioned    Home Living Family/patient expects to be discharged to:: Private residence Living Arrangements: Spouse/significant other Available Help at Discharge: Family;Available PRN/intermittently Type of Home: House Home Access: Stairs to enter Entrance Stairs-Rails: None Entrance Stairs-Number of Steps: 3 Home Layout: One level Home Equipment: None      Prior Function Level of Independence: Independent         Comments: Pt denies use of AD for ambulation; pt reports 1 fall in which he slid out of recliner chair and was unable to get up off of floor in which wife called EMS and brought him to the hospital;      Hand Dominance        Extremity/Trunk Assessment   Upper Extremity Assessment Upper Extremity Assessment: RUE deficits/detail;Defer to OT evaluation (LUE grossly 4 to 4+/5) RUE Deficits / Details: pt reports pain as well as numbness/tingling from R clavical and distally RUE: Unable to fully assess due to pain    Lower Extremity Assessment Lower Extremity Assessment: Generalized weakness    Cervical / Trunk Assessment Cervical / Trunk Assessment: Normal  Communication   Communication: No difficulties  Cognition Arousal/Alertness: Awake/alert Behavior During Therapy: WFL for tasks assessed/performed Overall Cognitive Status: Within Functional Limits for tasks assessed                                        General Comments      Exercises Other Exercises Other Exercises: LAQ, marches, hip ab/add x 10 bilaterally; min A for SLR bilaterally x 5 reps each   Assessment/Plan    PT Assessment Patient needs continued PT services  PT Problem List Decreased strength;Decreased activity tolerance;Decreased  balance;Decreased mobility;Impaired sensation;Pain       PT Treatment Interventions DME instruction;Gait training;Stair training;Functional mobility training;Therapeutic activities;Therapeutic exercise;Balance training;Patient/family education    PT Goals (Current goals can be found in the Care Plan section)  Acute Rehab PT Goals Patient Stated Goal: to go home PT Goal Formulation: With patient Time For Goal Achievement: 01/03/20 Potential to Achieve Goals: Good    Frequency Min 2X/week   Barriers to discharge        Co-evaluation               AM-PAC PT "6 Clicks" Mobility  Outcome Measure Help needed turning from your back to your side while in a flat bed without using bedrails?: A Little Help needed moving from lying on your back to sitting on the side of a flat bed without using bedrails?: A Lot Help needed moving to and from a bed to a chair (including a wheelchair)?: A Little Help needed standing up from a chair using your arms (e.g., wheelchair or bedside chair)?: A Little Help needed to walk in hospital room?: A Lot Help needed climbing 3-5 steps with a railing? : A Lot 6 Click Score: 15    End of Session Equipment Utilized During Treatment: Gait belt;Other (comment) (gait  belt placed under armpits due to peg tube) Activity Tolerance: Patient limited by fatigue;Patient tolerated treatment well Patient left: in chair;with call bell/phone within reach;with chair alarm set Nurse Communication: Mobility status PT Visit Diagnosis: Unsteadiness on feet (R26.81);Other abnormalities of gait and mobility (R26.89);Muscle weakness (generalized) (M62.81);History of falling (Z91.81);Pain Pain - Right/Left: Right Pain - part of body: Arm    Time: 1991-4445 PT Time Calculation (min) (ACUTE ONLY): 29 min   Charges:              Vale Haven, SPT  Vale Haven 12/20/2019, 12:15 PM

## 2019-12-21 ENCOUNTER — Ambulatory Visit: Payer: 59

## 2019-12-21 ENCOUNTER — Inpatient Hospital Stay: Payer: 59 | Admitting: Oncology

## 2019-12-21 ENCOUNTER — Inpatient Hospital Stay: Payer: 59

## 2019-12-21 DIAGNOSIS — Z515 Encounter for palliative care: Secondary | ICD-10-CM | POA: Diagnosis not present

## 2019-12-21 DIAGNOSIS — C158 Malignant neoplasm of overlapping sites of esophagus: Secondary | ICD-10-CM | POA: Diagnosis not present

## 2019-12-21 DIAGNOSIS — R531 Weakness: Secondary | ICD-10-CM | POA: Diagnosis not present

## 2019-12-21 LAB — CBC
HCT: 29.8 % — ABNORMAL LOW (ref 39.0–52.0)
Hemoglobin: 9.8 g/dL — ABNORMAL LOW (ref 13.0–17.0)
MCH: 30.6 pg (ref 26.0–34.0)
MCHC: 32.9 g/dL (ref 30.0–36.0)
MCV: 93.1 fL (ref 80.0–100.0)
Platelets: 175 10*3/uL (ref 150–400)
RBC: 3.2 MIL/uL — ABNORMAL LOW (ref 4.22–5.81)
RDW: 14.6 % (ref 11.5–15.5)
WBC: 2.5 10*3/uL — ABNORMAL LOW (ref 4.0–10.5)
nRBC: 0.8 % — ABNORMAL HIGH (ref 0.0–0.2)

## 2019-12-21 LAB — BASIC METABOLIC PANEL
Anion gap: 9 (ref 5–15)
BUN: 25 mg/dL — ABNORMAL HIGH (ref 6–20)
CO2: 27 mmol/L (ref 22–32)
Calcium: 9.8 mg/dL (ref 8.9–10.3)
Chloride: 97 mmol/L — ABNORMAL LOW (ref 98–111)
Creatinine, Ser: 1.2 mg/dL (ref 0.61–1.24)
GFR, Estimated: >60 mL/min (ref >60)
Glucose, Bld: 155 mg/dL — ABNORMAL HIGH (ref 70–99)
Potassium: 3.7 mmol/L (ref 3.5–5.1)
Sodium: 133 mmol/L — ABNORMAL LOW (ref 135–145)

## 2019-12-21 MED ORDER — INFLUENZA VAC SPLIT QUAD 0.5 ML IM SUSY
0.5000 mL | PREFILLED_SYRINGE | INTRAMUSCULAR | Status: DC
  Filled 2019-12-21: qty 0.5

## 2019-12-21 NOTE — Progress Notes (Signed)
[place]  [phone] [phone]   Name: Jacob Henson Date: 12/21/2019 MRN: [id number]  DOB: [birth year]  Patient Care Team: Elby Beck, FNP as PCP - General (Nurse Practitioner) Clent Jacks, RN as Oncology Nurse Navigator Sindy Guadeloupe, MD as Consulting Physician (Oncology)    REASON FOR CONSULTATION: Jacob Henson is a 59 y.o. 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  Palliative care was consulted over address goals and manage ongoing symptoms..    CODE STATUS: Full code  PAST MEDICAL HISTORY: Past Medical History:  Diagnosis Date  . Allergy   . Cancer ([place])   . Esophageal cancer ([place])   . GERD (gastroesophageal reflux disease)    Has required esophageal dilation in past  . Hypertension     PAST SURGICAL HISTORY:  Past Surgical History:  Procedure Laterality Date  . ESOPHAGEAL DILATION    . PEG TUBE PLACEMENT  11/30/2019  . PORTA CATH INSERTION N/A 11/19/2019   Procedure: PORTA CATH INSERTION;  Surgeon: Algernon Huxley, MD;  Location: [facility];  Service: Cardiovascular;  Laterality: N/A;    HEMATOLOGY/ONCOLOGY HISTORY:  Oncology History  Esophageal adenocarcinoma ([place])  11/15/2019 Initial Diagnosis   Esophageal adenocarcinoma ([place])   11/22/2019 -  Chemotherapy   The patient had dexamethasone (DECADRON) 4 MG tablet, 8 mg, Oral, Daily, 1 of 1 cycle, Start date: --, End date: -- palonosetron (ALOXI) injection 0.25 mg, 0.25 mg, Intravenous,  Once, 2 of 6 cycles Administration: 0.25 mg (11/22/2019), 0.25 mg (12/07/2019) pegfilgrastim-cbqv (UDENYCA) injection 6 mg, 6 mg, Subcutaneous, Once, 1 of 5 cycles Administration: 6 mg (12/09/2019) leucovorin 1,000  mg in dextrose 5 % 250 mL infusion, 383 mg/m2 = 1,044 mg, Intravenous,  Once, 2 of 6 cycles Administration: 1,000 mg (11/22/2019), 1,000 mg (12/07/2019) oxaliplatin (ELOXATIN) 170 mg in dextrose 5 % 500 mL chemo infusion, 65 mg/m2 = 170 mg (100 % of original dose 65 mg/m2), Intravenous,  Once, 2 of 6 cycles Dose modification: 65 mg/m2 (original dose 65 mg/m2, Cycle 1, Reason: Other (see comments), Comment: Minimize toxicity in palliative setting) Administration: 170 mg (11/22/2019), 170 mg (12/07/2019) fluorouracil (ADRUCIL) chemo injection 1,000 mg, 383 mg/m2 = 1,050 mg, Intravenous,  Once, 2 of 6 cycles Administration: 1,000 mg (11/22/2019), 1,000 mg (12/07/2019) pembrolizumab (KEYTRUDA) 400 mg in sodium chloride 0.9 % 50 mL chemo infusion, 400 mg (100 % of original dose 400 mg), Intravenous, Once, 1 of 2 cycles Dose modification: 400 mg (original dose 400 mg, Cycle 2) Administration: 400 mg (12/09/2019) fluorouracil (ADRUCIL) 6,250 mg in sodium chloride 0.9 % 125 mL chemo infusion, 2,400 mg/m2 = 6,250 mg, Intravenous, 1 Day/Dose, 2 of 6 cycles Administration: 6,250 mg (11/22/2019), 6,250 mg (12/07/2019)  for chemotherapy treatment.      ALLERGIES:  has No Known Allergies.  MEDICATIONS:  Current Facility-Administered Medications  Medication Dose Route Frequency Provider Last Rate Last Admin  . acetaminophen (TYLENOL) tablet 650 mg  650 mg Oral Q6H PRN Mansy, Jan A, MD       Or  . acetaminophen (TYLENOL) suppository 650 mg  650 mg Rectal Q6H PRN Mansy, Jan A, MD      . chlorhexidine (PERIDEX) 0.12 % solution 15 mL  15 mL Mouth Rinse BID Lorella Nimrod, MD  15 mL at 12/20/19 2236  . Chlorhexidine Gluconate Cloth 2 % PADS 6 each  6 each Topical Daily Dessa Phi, DO   6 each at 12/20/19 0932  . ciprofloxacin (CIPRO) tablet 500 mg  500 mg Oral BID Earlie Server, MD   500 mg at 12/21/19 1015  . enoxaparin (LOVENOX) injection 65 mg  65 mg Subcutaneous Q24H Mansy, Jan A, MD   65 mg at 12/21/19  1019  . feeding supplement (OSMOLITE 1.5 CAL) liquid 1,000 mL  1,000 mL Per Tube Continuous Dessa Phi, DO 80 mL/hr at 12/20/19 1530 1,000 mL at 12/20/19 1530  . feeding supplement (PROSource TF) liquid 45 mL  45 mL Per Tube BID Dessa Phi, DO   45 mL at 12/21/19 1023  . fentaNYL (DURAGESIC) 12 MCG/HR 1 patch  1 patch Transdermal Q72H Mansy, Jan A, MD      . free water 200 mL  200 mL Per Tube Q4H Dessa Phi, DO   200 mL at 12/21/19 1023  . influenza vac split quadrivalent PF (FLUARIX) injection 0.5 mL  0.5 mL Intramuscular Tomorrow-1000 Dessa Phi, DO      . lidocaine-prilocaine (EMLA) cream 1 application  1 application Topical Once Mansy, Jan A, MD      . magnesium hydroxide (MILK OF MAGNESIA) suspension 30 mL  30 mL Oral Daily PRN Mansy, Jan A, MD      . MEDLINE mouth rinse  15 mL Mouth Rinse q12n4p Lorella Nimrod, MD   15 mL at 12/19/19 2317  . metoCLOPramide (REGLAN) 5 MG/5ML solution 5 mg  5 mg Oral TID AC BelueAlver Sorrow, RPH   5 mg at 12/21/19 1334  . morphine 2 MG/ML injection 2 mg  2 mg Intravenous Q4H PRN Mansy, Jan A, MD      . OLANZapine (ZYPREXA) tablet 10 mg  10 mg Oral QHS Mansy, Jan A, MD   10 mg at 12/20/19 2236  . ondansetron (ZOFRAN) tablet 4 mg  4 mg Oral Q6H PRN Mansy, Jan A, MD       Or  . ondansetron [facility]) injection 4 mg  4 mg Intravenous Q6H PRN Mansy, Jan A, MD      . ondansetron (ZOFRAN-ODT) disintegrating tablet 8 mg  8 mg Oral Q8H PRN Mansy, Jan A, MD      . oxyCODONE (ROXICODONE) 5 MG/5ML solution 5 mg  5 mg Oral Q4H PRN Mansy, Jan A, MD   5 mg at 12/20/19 2236  . pantoprazole (PROTONIX) EC tablet 40 mg  40 mg Oral BID Mansy, Jan A, MD   40 mg at 12/21/19 1015  . sennosides (SENOKOT) 8.8 MG/5ML syrup 10 mL  10 mL Oral QHS Mansy, Jan A, MD   10 mL at 12/20/19 2236  . traZODone (DESYREL) tablet 25 mg  25 mg Oral QHS PRN Mansy, Arvella Merles, MD        VITAL SIGNS: BP (!) 144/81   Pulse 90   Temp 97.6 F (36.4 C) (Oral)   Resp 17   Ht 6\' 5"  (1.956 m)    Wt 285 lb (129.3 kg)   SpO2 97%   BMI 33.80 kg/m  Filed Weights   12/19/19 0139  Weight: 285 lb (129.3 kg)    Estimated body mass index is 33.8 kg/m as calculated from the following:   Height as of this encounter: 6\' 5"  (1.956 m).   Weight as of this encounter: 285 lb (129.3 kg).  LABS: CBC:    Component  Value Date/Time   WBC 2.5 (L) 12/21/2019 0622   HGB 9.8 (L) 12/21/2019 0622   HCT 29.8 (L) 12/21/2019 0622   PLT 175 12/21/2019 0622   MCV 93.1 12/21/2019 0622   NEUTROABS 0.2 (LL) 12/19/2019 0453   LYMPHSABS 0.5 (L) 12/19/2019 0453   MONOABS 0.5 12/19/2019 0453   EOSABS 0.0 12/19/2019 0453   BASOSABS 0.0 12/19/2019 0453   Comprehensive Metabolic Panel:    Component Value Date/Time   NA 133 (L) 12/21/2019 0622   NA 134 (A) 10/20/2019 0000   K 3.7 12/21/2019 0622   CL 97 (L) 12/21/2019 0622   CO2 27 12/21/2019 0622   BUN 25 (H) 12/21/2019 0622   BUN 29 (A) 10/20/2019 0000   CREATININE 1.20 12/21/2019 0622   GLUCOSE 155 (H) 12/21/2019 0622   CALCIUM 9.8 12/21/2019 0622   AST 37 12/19/2019 0445   ALT 44 12/19/2019 0445   ALKPHOS 78 12/19/2019 0445   BILITOT 1.1 12/19/2019 0445   PROT 6.0 (L) 12/19/2019 0445   ALBUMIN 2.5 (L) 12/19/2019 0445    RADIOGRAPHIC STUDIES: MR BRAIN W WO CONTRAST  Result Date: 12/20/2019 CLINICAL DATA:  Initial evaluation for intractable nausea, history of stage IV esophageal cancer. Evaluate for metastatic disease. EXAM: MRI HEAD WITHOUT AND WITH CONTRAST TECHNIQUE: Multiplanar, multiecho pulse sequences of the brain and surrounding structures were obtained without and with intravenous contrast. CONTRAST:  25mL GADAVIST GADOBUTROL 1 MMOL/ML IV SOLN COMPARISON:  None available. FINDINGS: Brain: Cerebral volume within normal limits for age. Few scattered foci of subcentimeter T2/FLAIR hyperintensity noted within the periventricular deep white matter both cerebral hemispheres, nonspecific, but most like related chronic microvascular ischemic  disease, mild for age. Superimposed remote lacunar infarcts noted within the left thalamus and basal ganglia. No abnormal foci of restricted diffusion to suggest acute or subacute ischemia. Gray-white matter differentiation maintained. No encephalomalacia to suggest chronic cortical infarction elsewhere within the brain. No acute intracranial hemorrhage. Few punctate chronic micro hemorrhages noted within the cerebellum, brainstem, and about the deep gray nuclei, most likely related to chronic underlying hypertension. No mass lesion, midline shift or mass effect. No abnormal enhancement or evidence for intracranial metastatic disease. Ventricles normal size without hydrocephalus. No extra-axial fluid collection. Pituitary gland suprasellar region within normal limits. Midline structures intact. Vascular: Major intracranial vascular flow voids are maintained. Skull and upper cervical spine: Craniocervical junction within normal limits. Bone marrow signal intensity normal. No visible focal marrow replacing lesion. No scalp soft tissue abnormality. Sinuses/Orbits: Globes orbital soft tissues within normal limits. Paranasal sinuses are largely clear. No mastoid effusion. Inner ear structures within normal limits. Other: None. IMPRESSION: 1. No acute intracranial abnormality or evidence for intracranial metastatic disease. 2. Mild chronic microvascular ischemic disease with superimposed remote lacunar infarcts involving the left thalamus and basal ganglia. 3. Few punctate chronic micro hemorrhages involving the cerebellum, brainstem, and deep gray nuclei, most likely related to chronic underlying hypertension. Electronically Signed   By: Jeannine Boga M.D.   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 EXAM: NUCLEAR MEDICINE PET SKULL BASE TO  THIGH TECHNIQUE: 15.4 mCi F-18 FDG was injected intravenously. Full-ring PET imaging was performed from the skull base to thigh after the radiotracer. CT data was obtained and used for attenuation correction and anatomic localization. Fasting blood glucose: 121 mg/dl COMPARISON:  CT abdomen/pelvis dated 10/21/2019. MRI  right shoulder dated 10/16/2019. FINDINGS: Mediastinal blood pool activity: SUV max 3.7 Liver activity: SUV max NA NECK: No hypermetabolic cervical lymphadenopathy. Incidental CT findings: none CHEST: Mass involving the lower 3rd of the esophagus (series 3/image 123), max SUV 15.0, corresponding to the patient's known primary esophageal neoplasm. 10 mm anterior left upper lobe nodule (series 3/image 85), max SUV 4.0, compatible with pulmonary metastasis. No hypermetabolic thoracic lymphadenopathy. Right chest port terminates in the lower SVC. Incidental CT findings: Mild atherosclerotic calcifications of the aortic arch. ABDOMEN/PELVIS: 3.0 cm right adrenal metastasis (series 3/image 152), max SUV 9.0. 8 mm short axis celiac axis node (series 3/image 149), max SUV 6.4. Incidental CT findings: Layering gallbladder sludge versus noncalcified gallstones. Mild atherosclerotic calcifications of the abdominal aorta and branch vessels. SKELETON: Widespread osseous metastases throughout the visualized axial and appendicular skeleton, including: --Destructive right scapular mass, max SUV 7.7 --Right sternum, max SUV 7.4 --Right 5th costosternal junction, max SUV 7.1 --T12 vertebral body, max SUV 12.4 --Destructive left iliac bone mass, max SUV 11.4 --Right proximal femoral shaft, max SUV 5.4 Incidental CT findings: Degenerative changes of the visualized thoracolumbar spine. IMPRESSION: Distal esophageal mass, corresponding to the patient's known primary esophageal neoplasm. 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 Widespread osseous metastases, including  destructive lesions in the right scapula and left iliac bone, as above. Electronically Signed   By: Julian Hy M.D.   On: 11/23/2019 11:43   DG Chest Portable 1 View  Result Date: 12/19/2019 CLINICAL DATA:  Weakness. EXAM: PORTABLE CHEST 1 VIEW COMPARISON:  October 10, 2018.  PET-CT dated 11/23/2019 FINDINGS: There is a well-positioned right-sided Port-A-Cath. Again noted is a lytic lesion involving the right glenoid. There is no pneumothorax. No large pleural effusion. There is some atelectasis at the lung bases. There is a lytic lesion involving the posterior eighth rib on the right. The heart size is unremarkable. IMPRESSION: 1. No acute cardiopulmonary process. 2. Well-positioned right-sided Port-A-Cath. 3. Lytic lesion involving the right glenoid and right eighth rib. Electronically Signed   By: Constance Holster M.D.   On: 12/19/2019 02:37   DG Abd 2 Views  Result Date: 12/19/2019 CLINICAL DATA:  Generalized weakness EXAM: ABDOMEN - 2 VIEW COMPARISON:  None. FINDINGS: The bowel gas pattern is normal. Moderate amount of right colonic stool is present. There is no evidence of free air. No radio-opaque calculi or other significant radiographic abnormality is seen. IMPRESSION: Nonobstructive bowel gas pattern. Electronically Signed   By: Prudencio Pair M.D.   On: 12/19/2019 16:30    PERFORMANCE STATUS (ECOG) : 3 - Symptomatic, >50% confined to bed  Review of Systems Unless otherwise noted, a complete review of systems is negative.  Physical Exam General: NAD Pulmonary: unlabored Abdomen: PEG noted but not visualized Extremities: no edema, no joint deformities Skin: no rashes Neurological: Weakness but otherwise nonfocal  IMPRESSION: Follow-up visit.  Patient reports that his nausea is improved today.  He seems to currently be tolerating tube feeds at 80 mL's per hour without recent nausea or vomiting.    Pain is reportedly stable.   Patient says that he had difficulty sleeping  last night and feels especially tired today.  It does not appear that he used his as needed trazodone.  Would recommend giving him trazodone this evening if he is having difficulty sleeping.  Patient says that he is waiting to speak to the social worker regarding options for rehab versus home health.  He wants to address  concerns about insurance and cost.  If patient ultimately discharges to rehab, I would recommend that he be followed by palliative care at the facility.  PLAN: -Continue current scope of treatment -Recommend rehab with palliative care following  Case and plan discussed with Dr. Janese Banks  Time Total: 20 minutes  Visit consisted of counseling and education dealing with the complex and emotionally intense issues of symptom management and palliative care in the setting of serious and potentially life-threatening illness.Greater than 50%  of this time was spent counseling and coordinating care related to the above assessment and plan.  Signed by: Altha Harm, PhD, NP-C

## 2019-12-21 NOTE — Progress Notes (Signed)
PROGRESS NOTE    Jacob Henson  AJG:[id number] DOB: 30-Jul-1960 DOA: 12/19/2019 PCP: Elby Beck, FNP     Brief Narrative:  Jacob Henson is a 59 y.o. 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  Since yesterday, the patient was not able to get out of bed.  He admitted to nausea and  vomiting without diarrhea or abdominal pain.  No bilious vomitus or hematemesis.  He has been having diminished appetite. Patient was admitted for weakness.   New events last 24 hours / Subjective: No nausea this morning.  Tube feeding has increased to 80 ml/hr.  Discussed SNF recommendation, which patient had apparently declined when talking with TOC.  He states that he is interested in SNF placement instead of going home with home health at this time.  Assessment & Plan:   Active Problems:   Weakness   AKI (acute kidney injury) (HCC)   Nausea and vomiting   Malignant neoplasm of overlapping sites of esophagus (HCC)   Anemia   Thrombocytopenia (HCC)   Neutropenia (HCC)   Palliative care encounter   Generalized weakness likely secondary to chemotherapy as well as mild acute kidney injury and dehydration -PT OT eval, recommended SNF placement.  TOC consulted  Hyponatremia likely hypovolemic due to volume depletion and anorexia -Continue free water flushes and monitor BMP.  Improved from admission.  Stage IV esophageal cancer on active radiotherapy and chemotherapy -Oncology following -Tube feeding management per dietitian -Continue chronic pain medications  Pancytopenia -Likely secondary to chemotherapy.  Continue to monitor CBC and fevers, bleeding.  Cipro started empirically  CKD  stage IIIa -Remains stable  Elevated troponin secondary to demand ischemia -Trend has been flat 22 --> 18  Depression -Continue Zyprexa   DVT prophylaxis: Lovenox   Code Status: Full Family Communication: No family at bedside Disposition Plan:  Status is: Inpatient  Remains inpatient appropriate because:Unsafe d/c plan   Dispo: The patient is from: Home              Anticipated d/c is to: SNF              Anticipated d/c date is: 1 day              Patient currently is not medically stable to d/c.  SNF placement pending   Consultants:   Oncology  Palliative care  Procedures:   None   Antimicrobials:  Anti-infectives (From admission, onward)   Start     Dose/Rate Route Frequency Ordered Stop   12/19/19 2000  ciprofloxacin (CIPRO) tablet 500 mg        500 mg Oral 2 times daily 12/19/19 1508         Objective: Vitals:   12/20/19 1646 12/20/19 2028 12/21/19 0018 12/21/19 0538  BP: 124/63 127/77 128/66 134/73  Pulse: 73 (!) 110 69 76  Resp: 14 16 16 20   Temp: 98.5 F (36.9 C) 98.4 F (36.9 C) 97.7 F (36.5 C) 98.1 F (36.7 C)  TempSrc: Oral Oral Oral   SpO2: 96% 98% 95% 93%  Weight:      Height:        Intake/Output Summary (Last 24 hours) at 12/21/2019 3559 Last data filed  at 12/21/2019 0347 Gross per 24 hour  Intake 1274.43 ml  Output 700 ml  Net 574.43 ml   Filed Weights   12/19/19 0139  Weight: 129.3 kg    Examination: General exam: Appears calm and comfortable  Respiratory system: Clear to auscultation. Respiratory effort normal. Cardiovascular system: S1 & S2 heard, RRR. No pedal edema. Gastrointestinal system: Abdomen is nondistended, soft and nontender. Normal bowel sounds heard. Central nervous system: Alert and oriented. Non focal exam. Speech clear  Extremities: Symmetric in appearance bilaterally  Skin: No rashes, lesions or ulcers on exposed skin  Psychiatry: Judgement and insight appear stable. Mood & affect appropriate.    Data Reviewed: I have personally reviewed following labs and imaging studies  CBC: Recent Labs  Lab 12/19/19 0150 12/19/19 0445 12/19/19 0453 12/20/19 0454 12/21/19 0622  WBC 1.0* 1.2* 1.2* 1.5* 2.5*  NEUTROABS 0.1*  --  0.2*  --   --   HGB 11.4* 10.7* 10.7* 10.0* 9.8*  HCT 34.3* 32.4* 33.6* 30.4* 29.8*  MCV 93.5 93.4 95.2 93.0 93.1  PLT 130* 131* 127* 135* 277   Basic Metabolic Panel: Recent Labs  Lab 12/19/19 0145 12/19/19 0445 12/20/19 0454 12/21/19 0622  NA 131* 131* 134* 133*  K 4.4 4.2 4.0 3.7  CL 91* 93* 98 97*  CO2 30 32 29 27  GLUCOSE 116* 132* 127* 155*  BUN 24* 23* 24* 25*  CREATININE 1.48* 1.44* 1.38* 1.20  CALCIUM 9.8 9.6 9.7 9.8  MG  --   --  1.9  --   PHOS  --   --  2.6  --    GFR: Estimated Creatinine Clearance: 98.6 mL/min (by C-G formula based on SCr of 1.2 mg/dL). Liver Function Tests: Recent Labs  Lab 12/19/19 0145 12/19/19 0445  AST 39 37  ALT 47* 44  ALKPHOS 84 78  BILITOT 1.1 1.1  PROT 6.4* 6.0*  ALBUMIN 2.7* 2.5*   No results for input(s): LIPASE, AMYLASE in the last 168 hours. No results for input(s): AMMONIA in the last 168 hours. Coagulation Profile: Recent Labs  Lab 12/19/19 0445  INR 1.3*   Cardiac Enzymes: No results for input(s): CKTOTAL, CKMB, CKMBINDEX, TROPONINI in the last 168 hours. BNP (last 3 results) No results for input(s): PROBNP in the last 8760 hours. HbA1C: No results for input(s): HGBA1C in the last 72 hours. CBG: No results for input(s): GLUCAP in the last 168 hours. Lipid Profile: No results for input(s): CHOL, HDL, LDLCALC, TRIG, CHOLHDL, LDLDIRECT in the last 72 hours. Thyroid Function Tests: No results for input(s): TSH, T4TOTAL, FREET4, T3FREE, THYROIDAB in the last 72 hours. Anemia Panel: No results for input(s): VITAMINB12, FOLATE, FERRITIN, TIBC, IRON, RETICCTPCT in the last 72 hours. Sepsis Labs: No results for input(s): PROCALCITON, LATICACIDVEN in the last 168 hours.  Recent Results  (from the past 240 hour(s))  Respiratory Panel by RT PCR (Flu A&B, Covid) - Nasopharyngeal Swab     Status: None   Collection Time: 12/19/19  1:50 AM   Specimen: Nasopharyngeal Swab  Result Value Ref Range Status   SARS Coronavirus 2 by RT PCR NEGATIVE NEGATIVE Final    Comment: (NOTE) SARS-CoV-2 target nucleic acids are NOT DETECTED.  The SARS-CoV-2 RNA is generally detectable in upper respiratoy specimens during the acute phase of infection. The lowest concentration of SARS-CoV-2 viral copies this assay can detect is 131 copies/mL. A negative result does not preclude SARS-Cov-2 infection and should not be used as the sole basis for treatment or  other patient management decisions. A negative result may occur with  improper specimen collection/handling, submission of specimen other than nasopharyngeal swab, presence of viral mutation(s) within the areas targeted by this assay, and inadequate number of viral copies (<131 copies/mL). A negative result must be combined with clinical observations, patient history, and epidemiological information. The expected result is Negative.  Fact Sheet for Patients:  PinkCheek.be  Fact Sheet for Healthcare Providers:  GravelBags.it  This test is no t yet approved or cleared by the [place] FDA and  has been authorized for detection and/or diagnosis of SARS-CoV-2 by FDA under an Emergency Use Authorization (EUA). This EUA will remain  in effect (meaning this test can be used) for the duration of the COVID-19 declaration under Section 564(b)(1) of the Act, 21 U.S.C. section 360bbb-3(b)(1), unless the authorization is terminated or revoked sooner.     Influenza A by PCR NEGATIVE NEGATIVE Final   Influenza B by PCR NEGATIVE NEGATIVE Final    Comment: (NOTE) The Xpert Xpress SARS-CoV-2/FLU/RSV assay is intended as an aid in  the diagnosis of influenza from Nasopharyngeal swab specimens  and  should not be used as a sole basis for treatment. Nasal washings and  aspirates are unacceptable for Xpert Xpress SARS-CoV-2/FLU/RSV  testing.  Fact Sheet for Patients: PinkCheek.be  Fact Sheet for Healthcare Providers: GravelBags.it  This test is not yet approved or cleared by the [place] FDA and  has been authorized for detection and/or diagnosis of SARS-CoV-2 by  FDA under an Emergency Use Authorization (EUA). This EUA will remain  in effect (meaning this test can be used) for the duration of the  Covid-19 declaration under Section 564(b)(1) of the Act, 21  U.S.C. section 360bbb-3(b)(1), unless the authorization is  terminated or revoked. Performed at [facility], [address] [place], [place] [address]       Radiology Studies: MR BRAIN W WO CONTRAST  Result Date: 12/20/2019 CLINICAL DATA:  Initial evaluation for intractable nausea, history of stage IV esophageal cancer. Evaluate for metastatic disease. EXAM: MRI HEAD WITHOUT AND WITH CONTRAST TECHNIQUE: Multiplanar, multiecho pulse sequences of the brain and surrounding structures were obtained without and with intravenous contrast. CONTRAST:  25mL GADAVIST GADOBUTROL 1 MMOL/ML IV SOLN COMPARISON:  None available. FINDINGS: Brain: Cerebral volume within normal limits for age. Few scattered foci of subcentimeter T2/FLAIR hyperintensity noted within the periventricular deep white matter both cerebral hemispheres, nonspecific, but most like related chronic microvascular ischemic disease, mild for age. Superimposed remote lacunar infarcts noted within the left thalamus and basal ganglia. No abnormal foci of restricted diffusion to suggest acute or subacute ischemia. Gray-white matter differentiation maintained. No encephalomalacia to suggest chronic cortical infarction elsewhere within the brain. No acute intracranial hemorrhage. Few punctate chronic micro  hemorrhages noted within the cerebellum, brainstem, and about the deep gray nuclei, most likely related to chronic underlying hypertension. No mass lesion, midline shift or mass effect. No abnormal enhancement or evidence for intracranial metastatic disease. Ventricles normal size without hydrocephalus. No extra-axial fluid collection. Pituitary gland suprasellar region within normal limits. Midline structures intact. Vascular: Major intracranial vascular flow voids are maintained. Skull and upper cervical spine: Craniocervical junction within normal limits. Bone marrow signal intensity normal. No visible focal marrow replacing lesion. No scalp soft tissue abnormality. Sinuses/Orbits: Globes orbital soft tissues within normal limits. Paranasal sinuses are largely clear. No mastoid effusion. Inner ear structures within normal limits. Other: None. IMPRESSION: 1. No acute intracranial abnormality or evidence for intracranial metastatic disease.  2. Mild chronic microvascular ischemic disease with superimposed remote lacunar infarcts involving the left thalamus and basal ganglia. 3. Few punctate chronic micro hemorrhages involving the cerebellum, brainstem, and deep gray nuclei, most likely related to chronic underlying hypertension. Electronically Signed   By: Jeannine Boga M.D.   On: 12/20/2019 22:55   DG Abd 2 Views  Result Date: 12/19/2019 CLINICAL DATA:  Generalized weakness EXAM: ABDOMEN - 2 VIEW COMPARISON:  None. FINDINGS: The bowel gas pattern is normal. Moderate amount of right colonic stool is present. There is no evidence of free air. No radio-opaque calculi or other significant radiographic abnormality is seen. IMPRESSION: Nonobstructive bowel gas pattern. Electronically Signed   By: Prudencio Pair M.D.   On: 12/19/2019 16:30      Scheduled Meds:  chlorhexidine  15 mL Mouth Rinse BID   Chlorhexidine Gluconate Cloth  6 each Topical Daily   ciprofloxacin  500 mg Oral BID   enoxaparin  (LOVENOX) injection  65 mg Subcutaneous Q24H   feeding supplement (PROSource TF)  45 mL Per Tube BID   fentaNYL  1 patch Transdermal Q72H   free water  200 mL Per Tube Q4H   influenza vac split quadrivalent PF  0.5 mL Intramuscular Tomorrow-1000   lidocaine-prilocaine  1 application Topical Once   mouth rinse  15 mL Mouth Rinse q12n4p   metoCLOPramide  5 mg Oral TID AC   OLANZapine  10 mg Oral QHS   pantoprazole  40 mg Oral BID   sennosides  10 mL Oral QHS   Continuous Infusions:  feeding supplement (OSMOLITE 1.5 CAL) 1,000 mL (12/20/19 1530)     LOS: 2 days      Time spent: 25 minutes   Dessa Phi, DO [facility] 12/21/2019, 9:28 AM   Available via Epic secure chat 7am-7pm After these hours, please refer to coverage provider listed on [email or web address]

## 2019-12-21 NOTE — Progress Notes (Signed)
Occupational Therapy Treatment Patient Details Name: Jacob Henson MRN: [id number] DOB: [birth year] Today's Date: 12/21/2019    History of present illness 59 y.o. male with PMH most significant for metastatic esophageal adenocarcinoma with bone metastasis, on active chemotherapy who presents to ER for progressive generalized weakness, nausea and vomiting after tube feeding   OT comments  Jacob Henson was seen for OT treatment on this date. Upon arrival to room pt reporting need to have BM - BSC found for pt. MIN A exit R side of bed - assist for trunk elevation and scooting to EOB as pt uses LUE only. Pt reports improved RUE pain free shoulder AROM this date requiring only MIN A don gown seated EOB. MIN A + HW for sit<>stand - assist for lift off, CGA + HW for SPT bed<>BSC and MAX A perihygiene in standing. Pt had multiple questions re: d/c planning; OT addressed concerns and notified CSW pt requesting further info for STR. Pt making good progress toward goals. Pt continues to benefit from skilled OT services to maximize return to PLOF and minimize risk of future falls, injury, caregiver burden, and readmission. Will continue to follow POC. Discharge recommendation remains appropriate.    Follow Up Recommendations  SNF    Equipment Recommendations  Other (comment);3 in 1 bedside commode (TBD; BSC if d/c home)    Recommendations for Other Services      Precautions / Restrictions Precautions Precautions: Fall       Mobility Bed Mobility Overal bed mobility: Needs Assistance Bed Mobility: Supine to Sit;Sit to Supine     Supine to sit: Min assist;HOB elevated Sit to supine: Min guard   General bed mobility comments: assist for trunk elevation and scooting to EOB - pt uses LUE only on L rail  Transfers Overall transfer level: Needs assistance Equipment used: Hemi-walker Transfers: Risk manager;Sit to/from Stand Sit to Stand: Min assist Stand pivot transfers: Min guard        General transfer comment: MIN A for lift off, CGA for SPT bed<>BSC using L hemiwalker    Balance Overall balance assessment: Needs assistance Sitting-balance support: Single extremity supported;Feet supported Sitting balance-Leahy Scale: Good     Standing balance support: Single extremity supported Standing balance-Leahy Scale: Fair Standing balance comment: tolerates ~70min standing for pericare        ADL either performed or assessed with clinical judgement   ADL Overall ADL's : Needs assistance/impaired           General ADL Comments: MIN A don gown seated EOB - improved RUE pain free shoulder AROM. MIN A for BSC t/f - MAX A perihygiene in standing               Cognition Arousal/Alertness: Awake/alert Behavior During Therapy: WFL for tasks assessed/performed Overall Cognitive Status: Within Functional Limits for tasks assessed             Exercises Exercises: Other exercises Other Exercises Other Exercises: Pt educated re: d/c recs, hemiwalker use, home/routines modifications, safe t/f technique Other Exercises: Don gown, sup<>sit, sit<>stand, sitting/standing balance/tolerance, toileting   Shoulder Instructions       General Comments VSS    Pertinent Vitals/ Pain       Pain Assessment: Faces Faces Pain Scale: Hurts little more Pain Location: R shoulder and arm Pain Descriptors / Indicators: Discomfort;Grimacing Pain Intervention(s): Limited activity within patient's tolerance;Repositioned         Frequency  Min 2X/week        Progress  Toward Goals  OT Goals(current goals can now be found in the care plan section)     Acute Rehab OT Goals Patient Stated Goal: to go home OT Goal Formulation: With patient Time For Goal Achievement: 01/02/20 Potential to Achieve Goals: Good  Plan         AM-PAC OT "6 Clicks" Daily Activity     Outcome Measure   Help from another person eating meals?: None (PEG, Independent drinking) Help from  another person taking care of personal grooming?: A Little Help from another person toileting, which includes using toliet, bedpan, or urinal?: A Lot Help from another person bathing (including washing, rinsing, drying)?: A Lot Help from another person to put on and taking off regular upper body clothing?: A Little Help from another person to put on and taking off regular lower body clothing?: A Lot 6 Click Score: 16    End of Session Equipment Utilized During Treatment: Other (comment) (hemiwalker)  OT Visit Diagnosis: Other abnormalities of gait and mobility (R26.89)   Activity Tolerance Patient tolerated treatment well   Patient Left in bed;with call bell/phone within reach;with bed alarm set (bed in chair position)   Nurse Communication Mobility status (Pt had BM)        Time: 1224-4975 OT Time Calculation (min): 29 min  Charges: OT General Charges $OT Visit: 1 Visit OT Treatments $Self Care/Home Management : 8-22 mins $Therapeutic Activity: 8-22 mins  Dessie Coma, M.S. OTR/L  12/21/19, 10:34 AM  ascom [phone]

## 2019-12-21 NOTE — Progress Notes (Signed)
Nutrition Follow-up  DOCUMENTATION CODES:   Non-severe (moderate) malnutrition in context of chronic illness  INTERVENTION:  Continue Osmolite 1.5 Cal at 80 mL/hr (1920 mL goal daily volume) + PROSource TF 45 mL Bid per tube. Provides 2960 kcal, 142 grams of protein, 1459 mL H2O daily.  With free water flush of 200 mL Q4hrs patient receives a total of 2659 mL H2O daily including water in tube feeding.  NUTRITION DIAGNOSIS:   Moderate Malnutrition related to chronic illness (stage IV esophageal cancer) as evidenced by moderate fat depletion, mild muscle depletion, moderate muscle depletion, 14.3% weight loss over 4 months.  New nutrition diagnosis.  GOAL:   Patient will meet greater than or equal to 90% of their needs  Met with TF regimen.  MONITOR:   Labs, Weight trends, TF tolerance, I & O's  REASON FOR ASSESSMENT:   Consult Enteral/tube feeding initiation and management  ASSESSMENT:   58 y.o. 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  Met with patient at bedside. He is now at goal rate of Osmolite 1.5 Cal at 80 mL/hr. He reports he is tolerating well. He denies any N/V or abdominal pain. Last BM was on 11/3. Per chart patient is deciding on rehab vs home health after discharge.  Patient reports his UBW was 350 lbs in July. Per chart patient was 332.5 lbs on 09/03/2019. He is currently documented to be 129.3 kg (285 lbs). If this weight is correct he has lost 47.5 lbs (14.3% body weight) over the past 4 months, which is significant for time frame. Patient may have lost even more weight than that as weights in 10/21 were lower than current weight documented.  Medications reviewed and include: ciprofloxacin, free water 200 mL Q4hrs, Reglan 5 mg TID,  Protonix, sennosides 10 mL QHS.  Labs reviewed: Sodium 133, Chloride 97, BUN 25. On 11/8 potassium, phosphorus, and magnesium all WNL.  I/O: 700 mL UOP yesterday (0.2 mL/kg/hr)  No weight to trend since 11/7  Enteral Access: 18 Fr. G-tube present on admission  TF regimen: Osmolite 1.5 Cal at 80 mL/hr + PROSource TF 45 mL BID  Discussed with RN.  NUTRITION - FOCUSED PHYSICAL EXAM:    Most Recent Value  Orbital Region Moderate depletion  Upper Arm Region Moderate depletion  Thoracic and Lumbar Region No depletion  Buccal Region Moderate depletion  Temple Region Moderate depletion  Clavicle Bone Region Mild depletion  Clavicle and Acromion Bone Region Mild depletion  Scapular Bone Region No depletion  Dorsal Hand Mild depletion  Patellar Region No depletion  Anterior Thigh Region No depletion  Posterior Calf Region Moderate depletion  Edema (RD Assessment) --  [non-pitting]  Hair Reviewed  Eyes Reviewed  Mouth Reviewed  Skin Reviewed  Nails Reviewed     Diet Order:   Diet Order            DIET SOFT Room service appropriate? Yes; Fluid consistency: Thin  Diet effective now                EDUCATION NEEDS:   No education needs have been identified at this time  Skin:  Skin Assessment: Reviewed RN Assessment  Last BM:  12/15/2019 per chart  Height:   Ht Readings from Last 1 Encounters:  12/19/19 _0  (1.956 m)  Weight:   Wt Readings from Last 1 Encounters:  12/19/19 129.3 kg   BMI:  Body mass index is 33.8 kg/m.  Estimated Nutritional Needs:   Kcal:  2700-3000kcal/day  Protein:  135-150g/day  Fluid:  2.9-3.2L/day  Jacklynn Barnacle, MS, RD, LDN Pager number available on Amion

## 2019-12-21 NOTE — TOC Progression Note (Addendum)
Transition of Care (TOC) - Progression Note    Patient Details  Name: Coran D Coll MRN: [id number] Date of Birth: 04/03/1960  Transition of Care (TOC) CM/SW Contact   E , LCSW Phone Number: 12/21/2019, 10:44 AM  Clinical Narrative:   CSW informed by MD and OT that patient now agreeable to SNF. Patient on Protective Precautions. Attempted call into room and to spouse to discuss. Left voicemail requesting a return call.    12:55- Tried to call patient room again. No answer. Asked RN to have patient call CSW back.   2:13- Attempted call to patient room again. No answer.   3:11- CSW reached out to Oncology about patient's chemo regimen. Per Dr. Rao, would put chemo on hold for 2 weeks while patient in rehab if he chooses to go to SNF for rehab.  CSW called [employer] regarding coverage for SNF versus Home Health Services. Spoke to Representative Anita.   Per Anita: Patient has met $137 of his $1,000 deductible, Anita reported patient's hospital bills from his current stay would go towards this.    If patient goes to SNF, the facility will have to get prior authorization by calling [phone]. Patient would need to go to an in network SNF and [employer] pays 90% after patient's $1,000 dedutcible is met. Limit of 100 days in SNF.   If patient goes home with Home Health Serivces, [employer] would cover RN, PT, OT, and/or Aide services. No authorization needed. [employer] would cover 90% after $1,000 deductible is met and no limit on visits.   Attempted call to patient's room again. No answer. Asked RN again to have patient call CSW. Per RN, patient asked his wife to call CSW back.   4:30- Call from patient's wife Carol. Explained the above information from insurance company. Carol reported she is still trying to decide if they want patient to go to SNF or home with Home Health Services. She is agreeable to CSW starting SNF work up to see which SNFs can accept patient and then she and  patient will decide based on that. CSW looked on [employer] website, unable to see list of in network SNFs. Carol reported she will reach out to [employer] for list.     Expected Discharge Plan: Home w Home Health Services Barriers to Discharge: Continued Medical Work up  Expected Discharge Plan and Services Expected Discharge Plan: Home w Home Health Services       Living arrangements for the past 2 months: Single Family Home                                       Social Determinants of Health (SDOH) Interventions    Readmission Risk Interventions No flowsheet data found.  

## 2019-12-21 NOTE — Progress Notes (Signed)
Ch visited pt. earlier in afternoon to finalize AD; pt. names Lindley Hiney (wife) as MPOA and Doyce Loose as alt. MPOA; pt. does not want life-prolonging measures continued/attempted in any of the situations described in Living Will (apparently imminent death from terminal illness; apparently permanent unconsciousness; severe dementia); MPOA is to follow wishes on Living Will.  Copies and original given to pt., copy placed in chart, copy scanned to [facility].  CH followed up later this afternoon, and pt. shared that he has been dealing with symptoms of cancer since this past July; pt. eventually diagnosed in early September w/mass in shoulder and esophagus; pt. currently admitted after severe weakness and inability to get up; medical team performed scan last night --> possible cancer in brain and bones.  Pt. seems to be accepting of the prognosis of six months to a year, voicing his belief that he cannot work against God and change the time when God will 'call him home.'  Pt.'s faith is major source of coping and comfort.  He is weighing how much treatment to undergo to address his cancer and has decided for now to pursue radiation and chemo as soon as he is physically able.  Pt. lives w/his wife and several 'four-legged children'; he is estranged from his mother and in loose contact with his brother and sister.  Wife joined visit after some time; shared that pt. has good support and that she is praying for God to give him strength to at least regain ability to eat, walk, etc. if complete healing does not happen.  Pt. and wife requested prayer for strength to face the numerous challenges posed by pt.'s illness.  [place] hopes to follow up later this week.

## 2019-12-21 NOTE — TOC Progression Note (Signed)
Transition of Care [facility]) - Progression Note    Patient Details  Name: Jacob Henson MRN: [id number] Date of Birth: [birth year]  Transition of Care [facility]) CM/SW [place], LCSW Phone Number: 12/21/2019, 8:28 AM  Clinical Narrative:   Corene Cornea with [place] unable to take patient for services.  Reached out to [facility] with [place], waiting to hear back.    Expected Discharge Plan: [place] Barriers to Discharge: Continued Medical Work up  Expected Discharge Plan and Services Expected Discharge Plan: [place] arrangements for the past 2 months: Single Family Home                                       Social Determinants of Health (SDOH) Interventions    Readmission Risk Interventions No flowsheet data found.

## 2019-12-22 ENCOUNTER — Ambulatory Visit: Payer: 59

## 2019-12-22 DIAGNOSIS — N179 Acute kidney failure, unspecified: Secondary | ICD-10-CM | POA: Diagnosis not present

## 2019-12-22 DIAGNOSIS — C158 Malignant neoplasm of overlapping sites of esophagus: Secondary | ICD-10-CM | POA: Diagnosis not present

## 2019-12-22 DIAGNOSIS — R112 Nausea with vomiting, unspecified: Secondary | ICD-10-CM | POA: Diagnosis not present

## 2019-12-22 LAB — BASIC METABOLIC PANEL
Anion gap: 6 (ref 5–15)
BUN: 21 mg/dL — ABNORMAL HIGH (ref 6–20)
CO2: 31 mmol/L (ref 22–32)
Calcium: 9.8 mg/dL (ref 8.9–10.3)
Chloride: 93 mmol/L — ABNORMAL LOW (ref 98–111)
Creatinine, Ser: 1.06 mg/dL (ref 0.61–1.24)
GFR, Estimated: >60 mL/min (ref >60)
Glucose, Bld: 131 mg/dL — ABNORMAL HIGH (ref 70–99)
Potassium: 4.1 mmol/L (ref 3.5–5.1)
Sodium: 130 mmol/L — ABNORMAL LOW (ref 135–145)

## 2019-12-22 LAB — CBC
HCT: 30.7 % — ABNORMAL LOW (ref 39.0–52.0)
Hemoglobin: 10.1 g/dL — ABNORMAL LOW (ref 13.0–17.0)
MCH: 30.3 pg (ref 26.0–34.0)
MCHC: 32.9 g/dL (ref 30.0–36.0)
MCV: 92.2 fL (ref 80.0–100.0)
Platelets: 197 10*3/uL (ref 150–400)
RBC: 3.33 MIL/uL — ABNORMAL LOW (ref 4.22–5.81)
RDW: 14.5 % (ref 11.5–15.5)
WBC: 5 10*3/uL (ref 4.0–10.5)
nRBC: 0.6 % — ABNORMAL HIGH (ref 0.0–0.2)

## 2019-12-22 LAB — GLUCOSE, CAPILLARY
Glucose-Capillary: 145 mg/dL — ABNORMAL HIGH (ref 70–99)
Glucose-Capillary: 146 mg/dL — ABNORMAL HIGH (ref 70–99)
Glucose-Capillary: 104 mg/dL — ABNORMAL HIGH (ref 70–99)

## 2019-12-22 MED ORDER — INFLUENZA VAC SPLIT QUAD 0.5 ML IM SUSY
0.5000 mL | PREFILLED_SYRINGE | INTRAMUSCULAR | Status: DC
  Filled 2019-12-22: qty 0.5

## 2019-12-22 NOTE — TOC Progression Note (Addendum)
Transition of Care [facility]) - Progression Note    Patient Details  Name: CABOT CROMARTIE MRN: [id number] Date of Birth: 1960-06-21  Transition of Care [facility]) CM/SW [place], LCSW Phone Number: 12/22/2019, 1:32 PM  Clinical Narrative:   CSW completed PASRR and SNF work up. Sent patient's information to local SNFS. Waiting to hear from patient's wife on preferred/in network SNFs.  Cory with [facility] reported he would be able to accommodate patient for Home Health if family chooses that route.   Expected Discharge Plan: [place] Barriers to Discharge: Continued Medical Work up  Expected Discharge Plan and Services Expected Discharge Plan: [place] arrangements for the past 2 months: Single Family Home                                       Social Determinants of Health (SDOH) Interventions    Readmission Risk Interventions No flowsheet data found.

## 2019-12-22 NOTE — NC FL2 (Signed)
[place] LEVEL OF CARE SCREENING TOOL     IDENTIFICATION  Patient Name: Jacob Henson Birthdate: Jul 12, 1960 Sex: male Admission Date (Current Location): 12/19/2019  [place] and [place] Number:  Engineering geologist and Address:  [facility], [address] [place], [place] [address]      Provider Number: [id number]  Attending Physician Name and Address:  Mckinley Jewel, MD  Relative Name and Phone Number:  [place], Corella (Spouse) [phone] [facility])    Current Level of Care: Hospital Recommended Level of Care: [place] Prior Approval Number:    Date Approved/Denied:   PASRR Number: [id number]  Discharge Plan: SNF    Current Diagnoses: Patient Active Problem List   Diagnosis Date Noted  . Palliative care encounter   . Weakness 12/19/2019  . AKI (acute kidney injury) ([place])   . Nausea and vomiting   . Malignant neoplasm of overlapping sites of esophagus ([place])   . Anemia   . Thrombocytopenia ([place])   . Neutropenia ([place])   . Palliative care patient 11/30/2019  . Goals of care, counseling/discussion 11/15/2019  . 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 Mass of joint of right shoulder 10/28/2019  . Essential hypertension 10/28/2019  . GERD (gastroesophageal reflux disease) 10/28/2019    Orientation RESPIRATION BLADDER Height & Weight     Self, Time, Situation, Place  Normal Incontinent, External catheter Weight: 285 lb (129.3 kg) Height:  6\' 5"  (195.6 cm)  BEHAVIORAL SYMPTOMS/MOOD NEUROLOGICAL BOWEL NUTRITION STATUS      Continent Diet (soft diet, thin liquids)  AMBULATORY STATUS COMMUNICATION OF NEEDS Skin   Extensive Assist Verbally Other (Comment) (incision- abodmen- closed)                       Personal Care Assistance Level of Assistance  Bathing, Feeding, Dressing Bathing Assistance: Maximum assistance Feeding assistance: Independent Dressing Assistance: Maximum assistance      Functional Limitations Info             SPECIAL CARE FACTORS FREQUENCY  OT (By licensed OT), PT (By licensed PT)     PT Frequency: 5 x/week OT Frequency: 5 x/week            Contractures      Additional Factors Info  Code Status, Allergies Code Status Info: full code Allergies Info: nka           Current Medications (12/22/2019):  This is the current hospital active medication list Current Facility-Administered Medications  Medication Dose Route Frequency Provider Last Rate Last Admin  . acetaminophen (TYLENOL) tablet 650 mg  650 mg Oral Q6H PRN Mansy, Jan A, MD       Or  . acetaminophen (TYLENOL) suppository 650 mg  650 mg Rectal Q6H PRN Mansy, Jan A, MD      . chlorhexidine (PERIDEX) 0.12 % solution 15 mL  15 mL Mouth Rinse BID Lorella Nimrod, MD   15 mL at 12/20/19 2236  . Chlorhexidine Gluconate Cloth 2 % PADS 6 each  6 each Topical Daily Dessa Phi, DO   6 each at 12/21/19 1524  . ciprofloxacin (CIPRO) tablet 500 mg  500 mg Oral BID Earlie Server, MD   500 mg at 12/22/19 0834  . enoxaparin (LOVENOX) injection 65 mg  65 mg Subcutaneous Q24H Mansy, Jan A, MD   65 mg at 12/22/19 0831  . feeding supplement (OSMOLITE 1.5 CAL) liquid 1,000 mL  1,000 mL Per Tube Continuous Dessa Phi, DO  80 mL/hr at 12/20/19 1530 1,000 mL at 12/20/19 1530  . feeding supplement (PROSource TF) liquid 45 mL  45 mL Per Tube BID Dessa Phi, DO   45 mL at 12/22/19 0840  . fentaNYL (DURAGESIC) 12 MCG/HR 1 patch  1 patch Transdermal Q72H Mansy, Jan A, MD   1 patch at 12/22/19 0541  . free water 200 mL  200 mL Per Tube Q4H Dessa Phi, DO   200 mL at 12/22/19 0840  . [START ON 12/23/2019] influenza vac split quadrivalent PF (FLUARIX) injection 0.5 mL  0.5 mL Intramuscular Tomorrow-1000 Pahwani, Rinka R, MD      . lidocaine-prilocaine (EMLA) cream 1 application  1 application Topical Once Mansy, Jan A, MD      . magnesium hydroxide (MILK OF MAGNESIA) suspension 30 mL  30 mL Oral Daily PRN  Mansy, Jan A, MD      . MEDLINE mouth rinse  15 mL Mouth Rinse q12n4p Lorella Nimrod, MD   15 mL at 12/21/19 1200  . metoCLOPramide (REGLAN) 5 MG/5ML solution 5 mg  5 mg Oral TID AC BelueAlver Sorrow, RPH   5 mg at 12/22/19 1158  . morphine 2 MG/ML injection 2 mg  2 mg Intravenous Q4H PRN Mansy, Jan A, MD      . OLANZapine (ZYPREXA) tablet 10 mg  10 mg Oral QHS Mansy, Jan A, MD   10 mg at 12/21/19 2051  . ondansetron (ZOFRAN) tablet 4 mg  4 mg Oral Q6H PRN Mansy, Jan A, MD       Or  . ondansetron [facility]) injection 4 mg  4 mg Intravenous Q6H PRN Mansy, Jan A, MD      . ondansetron (ZOFRAN-ODT) disintegrating tablet 8 mg  8 mg Oral Q8H PRN Mansy, Jan A, MD      . oxyCODONE (ROXICODONE) 5 MG/5ML solution 5 mg  5 mg Oral Q4H PRN Mansy, Jan A, MD   5 mg at 12/21/19 2049  . pantoprazole (PROTONIX) EC tablet 40 mg  40 mg Oral BID Mansy, Jan A, MD   40 mg at 12/22/19 0834  . sennosides (SENOKOT) 8.8 MG/5ML syrup 10 mL  10 mL Oral QHS Mansy, Jan A, MD   10 mL at 12/20/19 2236  . traZODone (DESYREL) tablet 25 mg  25 mg Oral QHS PRN Mansy, Arvella Merles, MD         Discharge Medications: Please see discharge summary for a list of discharge medications.  Relevant Imaging Results:  Relevant Lab Results:   Additional Information SS #: [place], LCSW

## 2019-12-22 NOTE — Progress Notes (Signed)
PROGRESS NOTE    Jacob Henson  HKV:[id number] DOB: 05/30/1960 DOA: 12/19/2019 PCP: Elby Beck, FNP   Brief Narrative:  Jacob Henson is a 59 y.o.Caucasian malewith a known history of stage IVesophageal cancermetastatic to the bones, lungs and liveronactivechemotherapy and radiotherapy with last session of chemotherapy 1 week ago, and radiotherapy 2 weeks ago,GERD and hypertension,who presented to the emergency room with acute onset of progressive weakness over the last 3 days.   Assessment & Plan:  Generalized weakness likelysecondary to chemotherapyas well as mild acute kidney injury and dehydration: -PT OT eval, recommended SNF placement.  TOC consulted-working on patient's placement SNF versus home with home health -continue Zofran, Reglan, PPI -continue to monitor vitals closely.  Hyponatremia likely hypovolemic due to volume depletion and anorexia -Continue free water flushes and monitor BMP.   -Patient remains asymptomatic  Stage IV esophageal canceron activeradiotherapy andchemotherapy -Oncology following -Tube feeding management per dietitian -Continue chronic pain medications-oxycodone and fentanyl.  Pancytopenia -Likely secondary to chemotherapy.  Continue to monitor CBC and fevers, bleeding.  Cipro started empirically  CKD stage IIIa -Remains stable  Elevated troponin secondary to demand ischemia -Trend has been flat 22 --> 18  Depression -Continue Zyprexa  DVT prophylaxis: Lovenox Code Status: Full code  family Communication: None present at bedside.  Plan of care discussed with patient in length and he verbalized understanding and agreed with it. Disposition Plan: SNF versus home with home health  Consultants:  Oncology palliative care  Procedures:   None  Antimicrobials:  Cipro  Status is: Inpatient  Dispo: The patient is from: Home              Anticipated d/c is to: SNF versus home with home health               Anticipated d/c date is: 12/23/2019              Patient currently not medically stable for the discharge   Subjective: Patient seen and examined.  Resting comfortably on the bed.  Tells me that he is overall feeling better.  Denies any  complaints such as chest pain, shortness of breath, fever, chills, lightheadedness, dizziness or confusion.  Objective: Vitals:   12/22/19 0032 12/22/19 0458 12/22/19 0904 12/22/19 1218  BP: 120/76 125/76 119/67 (!) 111/59  Pulse: (!) 105 (!) 107 (!) 104 63  Resp: 18 20 16 18   Temp: 97.9 F (36.6 C) 98.7 F (37.1 C) 99.3 F (37.4 C) (!) 97.5 F (36.4 C)  TempSrc: Oral  Axillary Oral  SpO2: 96% 94% 95% 97%  Weight:      Height:        Intake/Output Summary (Last 24 hours) at 12/22/2019 1322 Last data filed at 12/22/2019 0840 Gross per 24 hour  Intake --  Output 600 ml  Net -600 ml   Filed Weights   12/19/19 0139  Weight: 129.3 kg    Examination:  General exam: Appears calm and comfortable, on room air Respiratory system: Clear to auscultation. Respiratory effort normal. Cardiovascular system: S1 & S2 heard, RRR. No JVD, murmurs, rubs, gallops or clicks. No pedal edema. Gastrointestinal system: Abdomen is nondistended, soft and nontender. No organomegaly or masses felt. Normal bowel sounds heard.  Has PEG tube.  No signs of active bleeding or discharge seen around the PEG tube site. Central nervous system: Alert and oriented. No focal neurological deficits. Extremities: Symmetric 5 x 5 power. Skin: No rashes, lesions or ulcers Psychiatry: Judgement and insight appear  normal. Mood & affect appropriate.    Data Reviewed: I have personally reviewed following labs and imaging studies  CBC: Recent Labs  Lab 12/19/19 0150 12/19/19 0150 12/19/19 0445 12/19/19 0453 12/20/19 0454 12/21/19 0622 12/22/19 0527  WBC 1.0*   < > 1.2* 1.2* 1.5* 2.5* 5.0  NEUTROABS 0.1*  --   --  0.2*  --   --   --   HGB 11.4*   < > 10.7* 10.7* 10.0*  9.8* 10.1*  HCT 34.3*   < > 32.4* 33.6* 30.4* 29.8* 30.7*  MCV 93.5   < > 93.4 95.2 93.0 93.1 92.2  PLT 130*   < > 131* 127* 135* 175 197   < > = values in this interval not displayed.   Basic Metabolic Panel: Recent Labs  Lab 12/19/19 0145 12/19/19 0445 12/20/19 0454 12/21/19 0622 12/22/19 0527  NA 131* 131* 134* 133* 130*  K 4.4 4.2 4.0 3.7 4.1  CL 91* 93* 98 97* 93*  CO2 30 32 29 27 31   GLUCOSE 116* 132* 127* 155* 131*  BUN 24* 23* 24* 25* 21*  CREATININE 1.48* 1.44* 1.38* 1.20 1.06  CALCIUM 9.8 9.6 9.7 9.8 9.8  MG  --   --  1.9  --   --   PHOS  --   --  2.6  --   --    GFR: Estimated Creatinine Clearance: 111.7 mL/min (by C-G formula based on SCr of 1.06 mg/dL). Liver Function Tests: Recent Labs  Lab 12/19/19 0145 12/19/19 0445  AST 39 37  ALT 47* 44  ALKPHOS 84 78  BILITOT 1.1 1.1  PROT 6.4* 6.0*  ALBUMIN 2.7* 2.5*   No results for input(s): LIPASE, AMYLASE in the last 168 hours. No results for input(s): AMMONIA in the last 168 hours. Coagulation Profile: Recent Labs  Lab 12/19/19 0445  INR 1.3*   Cardiac Enzymes: No results for input(s): CKTOTAL, CKMB, CKMBINDEX, TROPONINI in the last 168 hours. BNP (last 3 results) No results for input(s): PROBNP in the last 8760 hours. HbA1C: No results for input(s): HGBA1C in the last 72 hours. CBG: Recent Labs  Lab 12/22/19 0914 12/22/19 1219  GLUCAP 145* 146*   Lipid Profile: No results for input(s): CHOL, HDL, LDLCALC, TRIG, CHOLHDL, LDLDIRECT in the last 72 hours. Thyroid Function Tests: No results for input(s): TSH, T4TOTAL, FREET4, T3FREE, THYROIDAB in the last 72 hours. Anemia Panel: No results for input(s): VITAMINB12, FOLATE, FERRITIN, TIBC, IRON, RETICCTPCT in the last 72 hours. Sepsis Labs: No results for input(s): PROCALCITON, LATICACIDVEN in the last 168 hours.  Recent Results (from the past 240 hour(s))  Respiratory Panel by RT PCR (Flu A&B, Covid) - Nasopharyngeal Swab     Status: None    Collection Time: 12/19/19  1:50 AM   Specimen: Nasopharyngeal Swab  Result Value Ref Range Status   SARS Coronavirus 2 by RT PCR NEGATIVE NEGATIVE Final    Comment: (NOTE) SARS-CoV-2 target nucleic acids are NOT DETECTED.  The SARS-CoV-2 RNA is generally detectable in upper respiratoy specimens during the acute phase of infection. The lowest concentration of SARS-CoV-2 viral copies this assay can detect is 131 copies/mL. A negative result does not preclude SARS-Cov-2 infection and should not be used as the sole basis for treatment or other patient management decisions. A negative result may occur with  improper specimen collection/handling, submission of specimen other than nasopharyngeal swab, presence of viral mutation(s) within the areas targeted by this assay, and inadequate number of viral copies (<  131 copies/mL). A negative result must be combined with clinical observations, patient history, and epidemiological information. The expected result is Negative.  Fact Sheet for Patients:  PinkCheek.be  Fact Sheet for Healthcare Providers:  GravelBags.it  This test is no t yet approved or cleared by the [place] FDA and  has been authorized for detection and/or diagnosis of SARS-CoV-2 by FDA under an Emergency Use Authorization (EUA). This EUA will remain  in effect (meaning this test can be used) for the duration of the COVID-19 declaration under Section 564(b)(1) of the Act, 21 U.S.C. section 360bbb-3(b)(1), unless the authorization is terminated or revoked sooner.     Influenza A by PCR NEGATIVE NEGATIVE Final   Influenza B by PCR NEGATIVE NEGATIVE Final    Comment: (NOTE) The Xpert Xpress SARS-CoV-2/FLU/RSV assay is intended as an aid in  the diagnosis of influenza from Nasopharyngeal swab specimens and  should not be used as a sole basis for treatment. Nasal washings and  aspirates are unacceptable for Xpert  Xpress SARS-CoV-2/FLU/RSV  testing.  Fact Sheet for Patients: PinkCheek.be  Fact Sheet for Healthcare Providers: GravelBags.it  This test is not yet approved or cleared by the [place] FDA and  has been authorized for detection and/or diagnosis of SARS-CoV-2 by  FDA under an Emergency Use Authorization (EUA). This EUA will remain  in effect (meaning this test can be used) for the duration of the  Covid-19 declaration under Section 564(b)(1) of the Act, 21  U.S.C. section 360bbb-3(b)(1), unless the authorization is  terminated or revoked. Performed at [facility], [address] [place], [place] [address]       Radiology Studies: MR BRAIN W WO CONTRAST  Result Date: 12/20/2019 CLINICAL DATA:  Initial evaluation for intractable nausea, history of stage IV esophageal cancer. Evaluate for metastatic disease. EXAM: MRI HEAD WITHOUT AND WITH CONTRAST TECHNIQUE: Multiplanar, multiecho pulse sequences of the brain and surrounding structures were obtained without and with intravenous contrast. CONTRAST:  11mL GADAVIST GADOBUTROL 1 MMOL/ML IV SOLN COMPARISON:  None available. FINDINGS: Brain: Cerebral volume within normal limits for age. Few scattered foci of subcentimeter T2/FLAIR hyperintensity noted within the periventricular deep white matter both cerebral hemispheres, nonspecific, but most like related chronic microvascular ischemic disease, mild for age. Superimposed remote lacunar infarcts noted within the left thalamus and basal ganglia. No abnormal foci of restricted diffusion to suggest acute or subacute ischemia. Gray-white matter differentiation maintained. No encephalomalacia to suggest chronic cortical infarction elsewhere within the brain. No acute intracranial hemorrhage. Few punctate chronic micro hemorrhages noted within the cerebellum, brainstem, and about the deep gray nuclei, most likely related to chronic  underlying hypertension. No mass lesion, midline shift or mass effect. No abnormal enhancement or evidence for intracranial metastatic disease. Ventricles normal size without hydrocephalus. No extra-axial fluid collection. Pituitary gland suprasellar region within normal limits. Midline structures intact. Vascular: Major intracranial vascular flow voids are maintained. Skull and upper cervical spine: Craniocervical junction within normal limits. Bone marrow signal intensity normal. No visible focal marrow replacing lesion. No scalp soft tissue abnormality. Sinuses/Orbits: Globes orbital soft tissues within normal limits. Paranasal sinuses are largely clear. No mastoid effusion. Inner ear structures within normal limits. Other: None. IMPRESSION: 1. No acute intracranial abnormality or evidence for intracranial metastatic disease. 2. Mild chronic microvascular ischemic disease with superimposed remote lacunar infarcts involving the left thalamus and basal ganglia. 3. Few punctate chronic micro hemorrhages involving the cerebellum, brainstem, and deep gray nuclei, most likely related to chronic underlying  hypertension. Electronically Signed   By: Jeannine Boga M.D.   On: 12/20/2019 22:55    Scheduled Meds: . chlorhexidine  15 mL Mouth Rinse BID  . Chlorhexidine Gluconate Cloth  6 each Topical Daily  . ciprofloxacin  500 mg Oral BID  . enoxaparin (LOVENOX) injection  65 mg Subcutaneous Q24H  . feeding supplement (PROSource TF)  45 mL Per Tube BID  . fentaNYL  1 patch Transdermal Q72H  . free water  200 mL Per Tube Q4H  . [START ON 12/23/2019] influenza vac split quadrivalent PF  0.5 mL Intramuscular Tomorrow-1000  . lidocaine-prilocaine  1 application Topical Once  . mouth rinse  15 mL Mouth Rinse q12n4p  . metoCLOPramide  5 mg Oral TID AC  . OLANZapine  10 mg Oral QHS  . pantoprazole  40 mg Oral BID  . sennosides  10 mL Oral QHS   Continuous Infusions: . feeding supplement (OSMOLITE 1.5 CAL)  1,000 mL (12/20/19 1530)     LOS: 3 days   Time spent: 35 minutes   Hareem Surowiec Loann Quill, MD [facility]  If 7PM-7AM, please contact night-coverage [email or web address] 12/22/2019, 1:22 PM

## 2019-12-22 NOTE — Progress Notes (Signed)
Physical Therapy Treatment Patient Details Name: Jacob Henson MRN: [id number] DOB: [birth year] Today's Date: 12/22/2019    History of Present Illness 59 y.o. male with PMH most significant for metastatic esophageal adenocarcinoma with bone metastasis, on active chemotherapy who presents to ER for progressive generalized weakness, nausea and vomiting after tube feeding    PT Comments    Pt showed great effort and willingness to work with PT despite feeling worn out/weak post radiation earlier today.  He showed reasonable strength in LEs with supine exercises, tolerating light resistance with most tasks.  He needed assist with getting to/from supine, but again showed good effort with these tasks.  He needed bed height elevated ~5" to be able to rise to standing, using hemiwalker he was able to maintain static standing reasonably well.  He was very slow and guarded with limited side stepping along EOB, but did not have any LOBs or overt safety issues.  O2 remained in the high 90s t/o the session, however HR increased to 130s with modest EOB side stepping.  Follow Up Recommendations  SNF;Supervision for mobility/OOB     Equipment Recommendations   (tall hemiwalker)    Recommendations for Other Services       Precautions / Restrictions Precautions Precautions: Fall Restrictions Weight Bearing Restrictions: No Other Position/Activity Restrictions: RUE very painful at rest due to cancerous mass near R shoulder    Mobility  Bed Mobility Overal bed mobility: Needs Assistance Bed Mobility: Supine to Sit;Sit to Supine     Supine to sit: Min assist Sit to supine: Mod assist (heavy assist with LEs to get back into bed)      Transfers Overall transfer level: Needs assistance Equipment used: Hemi-walker Transfers: Sit to/from Stand Sit to Stand: From elevated surface;Min assist;Min guard         General transfer comment: unable to rise w/o assist from bed raised ~3", Needed  only light guidance with ~5" elevated surface with HW L UE use  Ambulation/Gait Ambulation/Gait assistance: Min guard   Assistive device: Hemi-walker       General Gait Details: deferred ambulation away from bed secondary to feeling weak and limited post radiation earlier.  However he did manage to do EOB side stepping ~3 ft to R, ~6 ft to L and another 6 ft.  Pt with some issues lifting/clearing R foot but no unsteadiness apart from needing to be more deliberate/slow with R foot advancement   Stairs             Wheelchair Mobility    Modified Rankin (Stroke Patients Only)       Balance Overall balance assessment: Needs assistance Sitting-balance support: Single extremity supported;Feet supported Sitting balance-Leahy Scale: Good Sitting balance - Comments: Pt able to maintain sitting balance confidently, able to adjust/scoot while sitting EOB with good confidence   Standing balance support: Single extremity supported Standing balance-Leahy Scale: Fair Standing balance comment: pt able to do weight shifts cautiously but w/o unsteadiness                            Cognition Arousal/Alertness: Awake/alert Behavior During Therapy: WFL for tasks assessed/performed Overall Cognitive Status: Within Functional Limits for tasks assessed                                        Exercises General Exercises - Lower Extremity  Ankle Circles/Pumps: Strengthening;10 reps Short Arc Quad: Strengthening;10 reps Heel Slides: Strengthening;10 reps (stronger on R than L, resisted led extensions) Hip ABduction/ADduction: Strengthening;10 reps (stronger on L than R)    General Comments General comments (skin integrity, edema, etc.): despite pain and fatigue pt motivated to do what he can with PT      Pertinent Vitals/Pain Pain Assessment: No/denies pain Pain Score: 5  Pain Location: R shoulder and arm    Home Living                      Prior  Function            PT Goals (current goals can now be found in the care plan section) Progress towards PT goals: Progressing toward goals    Frequency    Min 2X/week      PT Plan Current plan remains appropriate    Co-evaluation              AM-PAC PT "6 Clicks" Mobility   Outcome Measure  Help needed turning from your back to your side while in a flat bed without using bedrails?: A Little Help needed moving from lying on your back to sitting on the side of a flat bed without using bedrails?: A Lot Help needed moving to and from a bed to a chair (including a wheelchair)?: A Lot Help needed standing up from a chair using your arms (e.g., wheelchair or bedside chair)?: A Little Help needed to walk in hospital room?: A Lot Help needed climbing 3-5 steps with a railing? : A Lot 6 Click Score: 14    End of Session Equipment Utilized During Treatment: Gait belt Activity Tolerance: Patient limited by fatigue;Patient tolerated treatment well Patient left: with bed alarm set;with call bell/phone within reach;with family/visitor present   PT Visit Diagnosis: Unsteadiness on feet (R26.81);Other abnormalities of gait and mobility (R26.89);Muscle weakness (generalized) (M62.81);History of falling (Z91.81);Pain Pain - Right/Left: Right Pain - part of body: Arm     Time: 8889-1694 PT Time Calculation (min) (ACUTE ONLY): 33 min  Charges:  $Therapeutic Exercise: 8-22 mins $Therapeutic Activity: 8-22 mins                     Kreg Shropshire, DPT 12/22/2019, 5:41 PM

## 2019-12-23 ENCOUNTER — Inpatient Hospital Stay: Payer: 59

## 2019-12-23 ENCOUNTER — Telehealth: Payer: Self-pay

## 2019-12-23 ENCOUNTER — Ambulatory Visit: Payer: 59

## 2019-12-23 DIAGNOSIS — E440 Moderate protein-calorie malnutrition: Secondary | ICD-10-CM | POA: Insufficient documentation

## 2019-12-23 DIAGNOSIS — R112 Nausea with vomiting, unspecified: Secondary | ICD-10-CM | POA: Diagnosis not present

## 2019-12-23 DIAGNOSIS — R531 Weakness: Secondary | ICD-10-CM | POA: Diagnosis not present

## 2019-12-23 DIAGNOSIS — N179 Acute kidney failure, unspecified: Secondary | ICD-10-CM | POA: Diagnosis not present

## 2019-12-23 DIAGNOSIS — C158 Malignant neoplasm of overlapping sites of esophagus: Secondary | ICD-10-CM | POA: Diagnosis not present

## 2019-12-23 DIAGNOSIS — D696 Thrombocytopenia, unspecified: Secondary | ICD-10-CM | POA: Diagnosis not present

## 2019-12-23 LAB — BASIC METABOLIC PANEL
Anion gap: 7 (ref 5–15)
BUN: 20 mg/dL (ref 6–20)
CO2: 31 mmol/L (ref 22–32)
Calcium: 9.9 mg/dL (ref 8.9–10.3)
Chloride: 91 mmol/L — ABNORMAL LOW (ref 98–111)
Creatinine, Ser: 1.1 mg/dL (ref 0.61–1.24)
GFR, Estimated: >60 mL/min (ref >60)
Glucose, Bld: 136 mg/dL — ABNORMAL HIGH (ref 70–99)
Potassium: 4 mmol/L (ref 3.5–5.1)
Sodium: 129 mmol/L — ABNORMAL LOW (ref 135–145)

## 2019-12-23 LAB — CBC
HCT: 31 % — ABNORMAL LOW (ref 39.0–52.0)
Hemoglobin: 10.2 g/dL — ABNORMAL LOW (ref 13.0–17.0)
MCH: 30.3 pg (ref 26.0–34.0)
MCHC: 32.9 g/dL (ref 30.0–36.0)
MCV: 92 fL (ref 80.0–100.0)
Platelets: 201 10*3/uL (ref 150–400)
RBC: 3.37 MIL/uL — ABNORMAL LOW (ref 4.22–5.81)
RDW: 14.7 % (ref 11.5–15.5)
WBC: 6.3 10*3/uL (ref 4.0–10.5)
nRBC: 0.6 % — ABNORMAL HIGH (ref 0.0–0.2)

## 2019-12-23 IMAGING — CR DG HIP (WITH OR WITHOUT PELVIS) 2-3V*R*
1 series · 3 of 3 positions shown · non-contrast
Comparison: PET-CT, [DATE]

CLINICAL DATA: Bilateral hip pain, esophageal cancer with osseous
metastatic disease

EXAM:
DG HIP (WITH OR WITHOUT PELVIS) 2-3V LEFT; DG HIP (WITH OR WITHOUT
PELVIS) 2-3V RIGHT

[Series 1: dg hip unilat w or w/o pelvis 2-3 views  · non-contrast · 0.14mm/px · 3 of 3 slices shown]
[im 1/3]
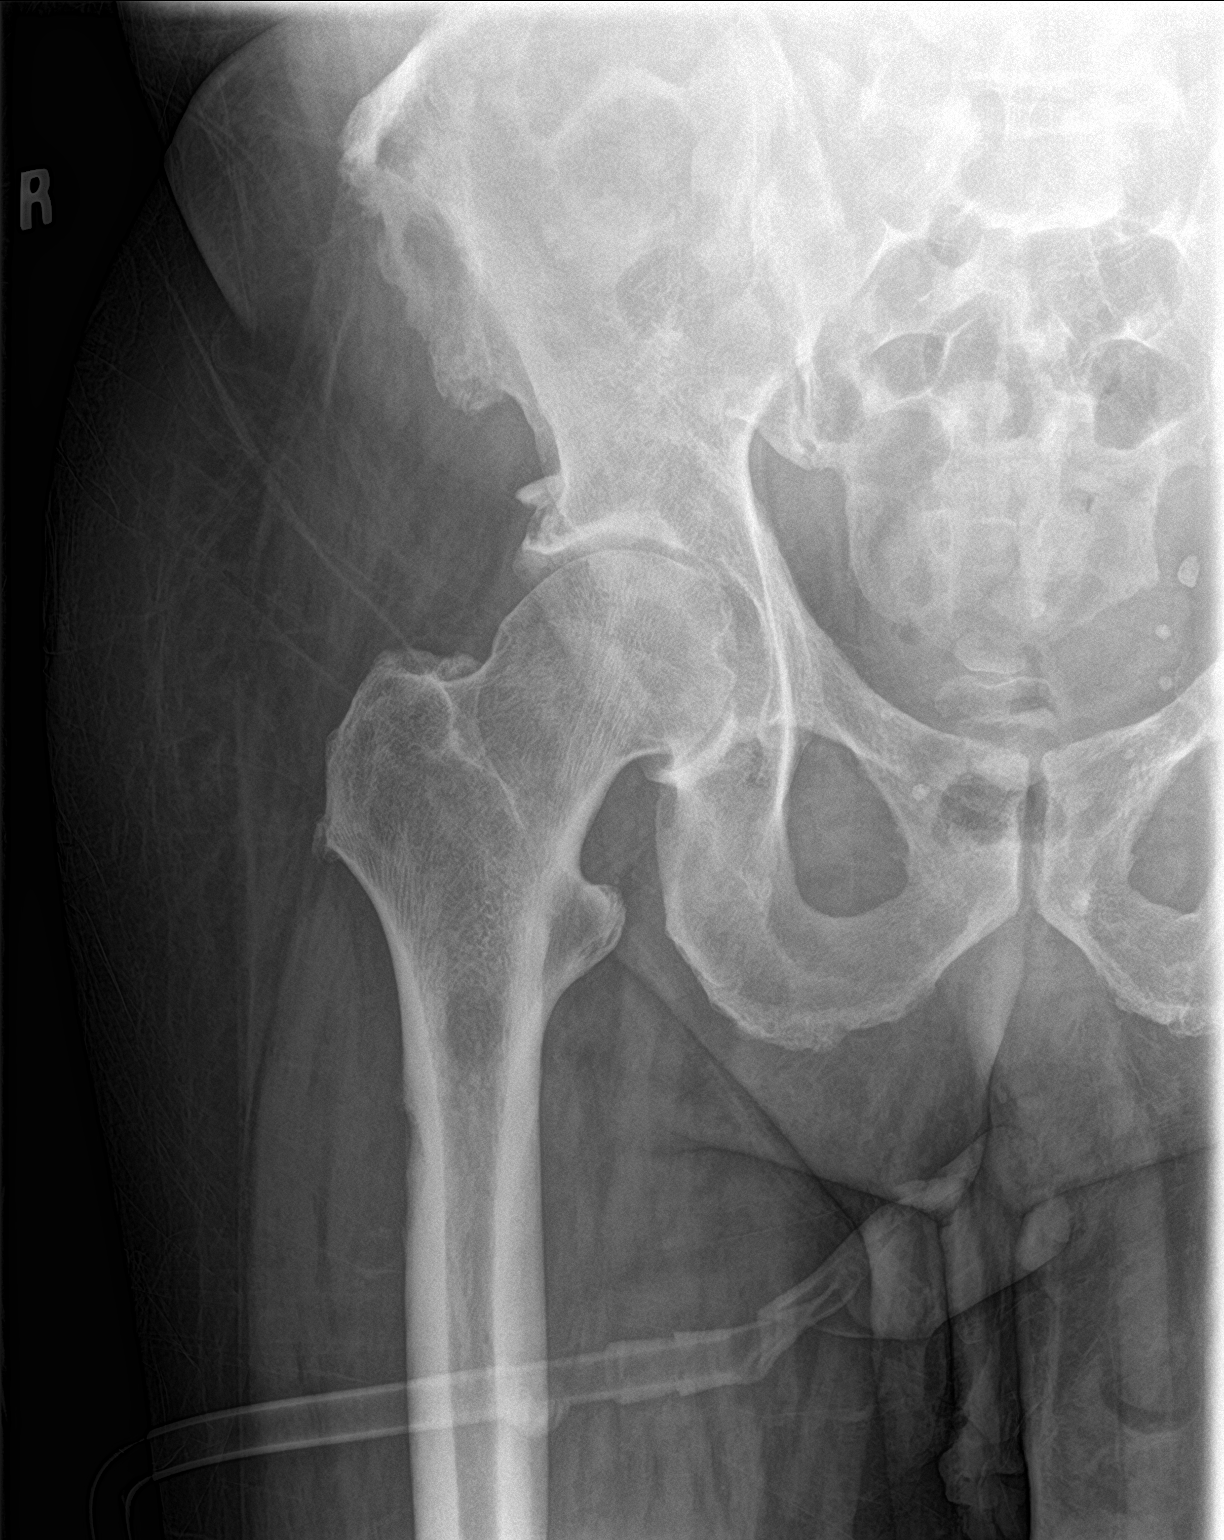
[im 2/3]
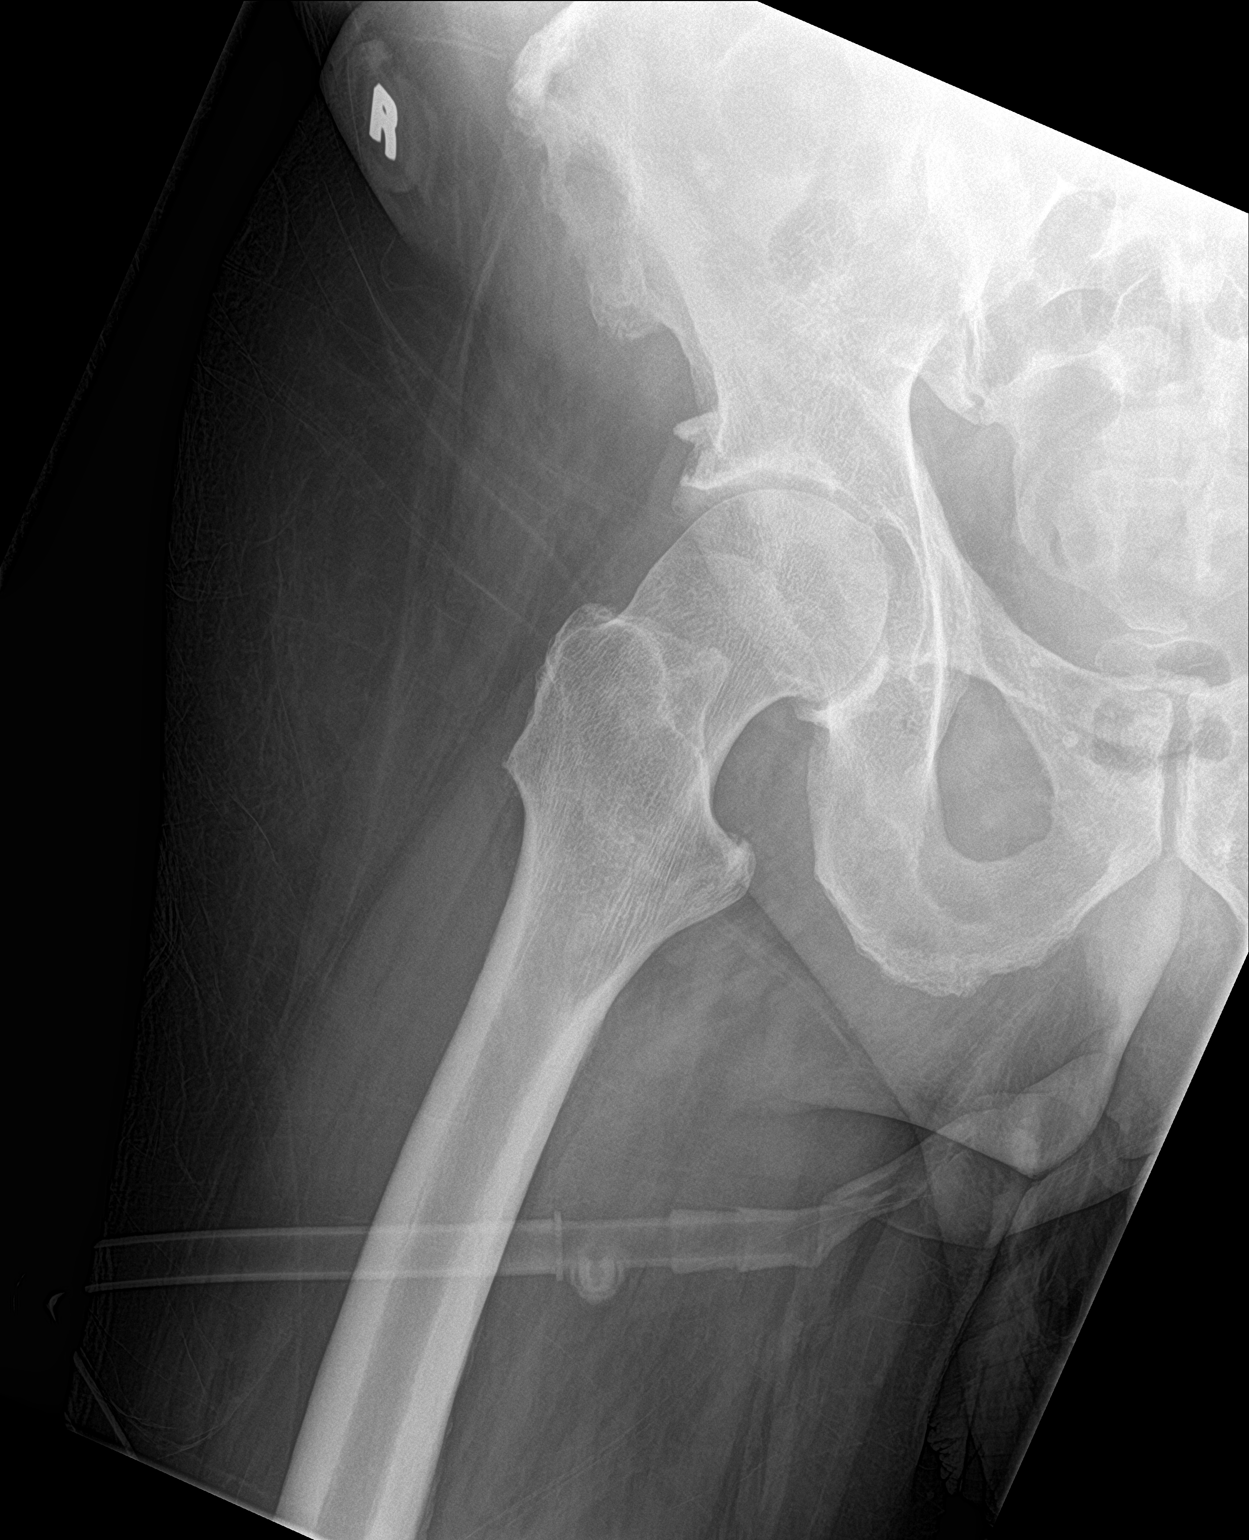
[im 3/3]
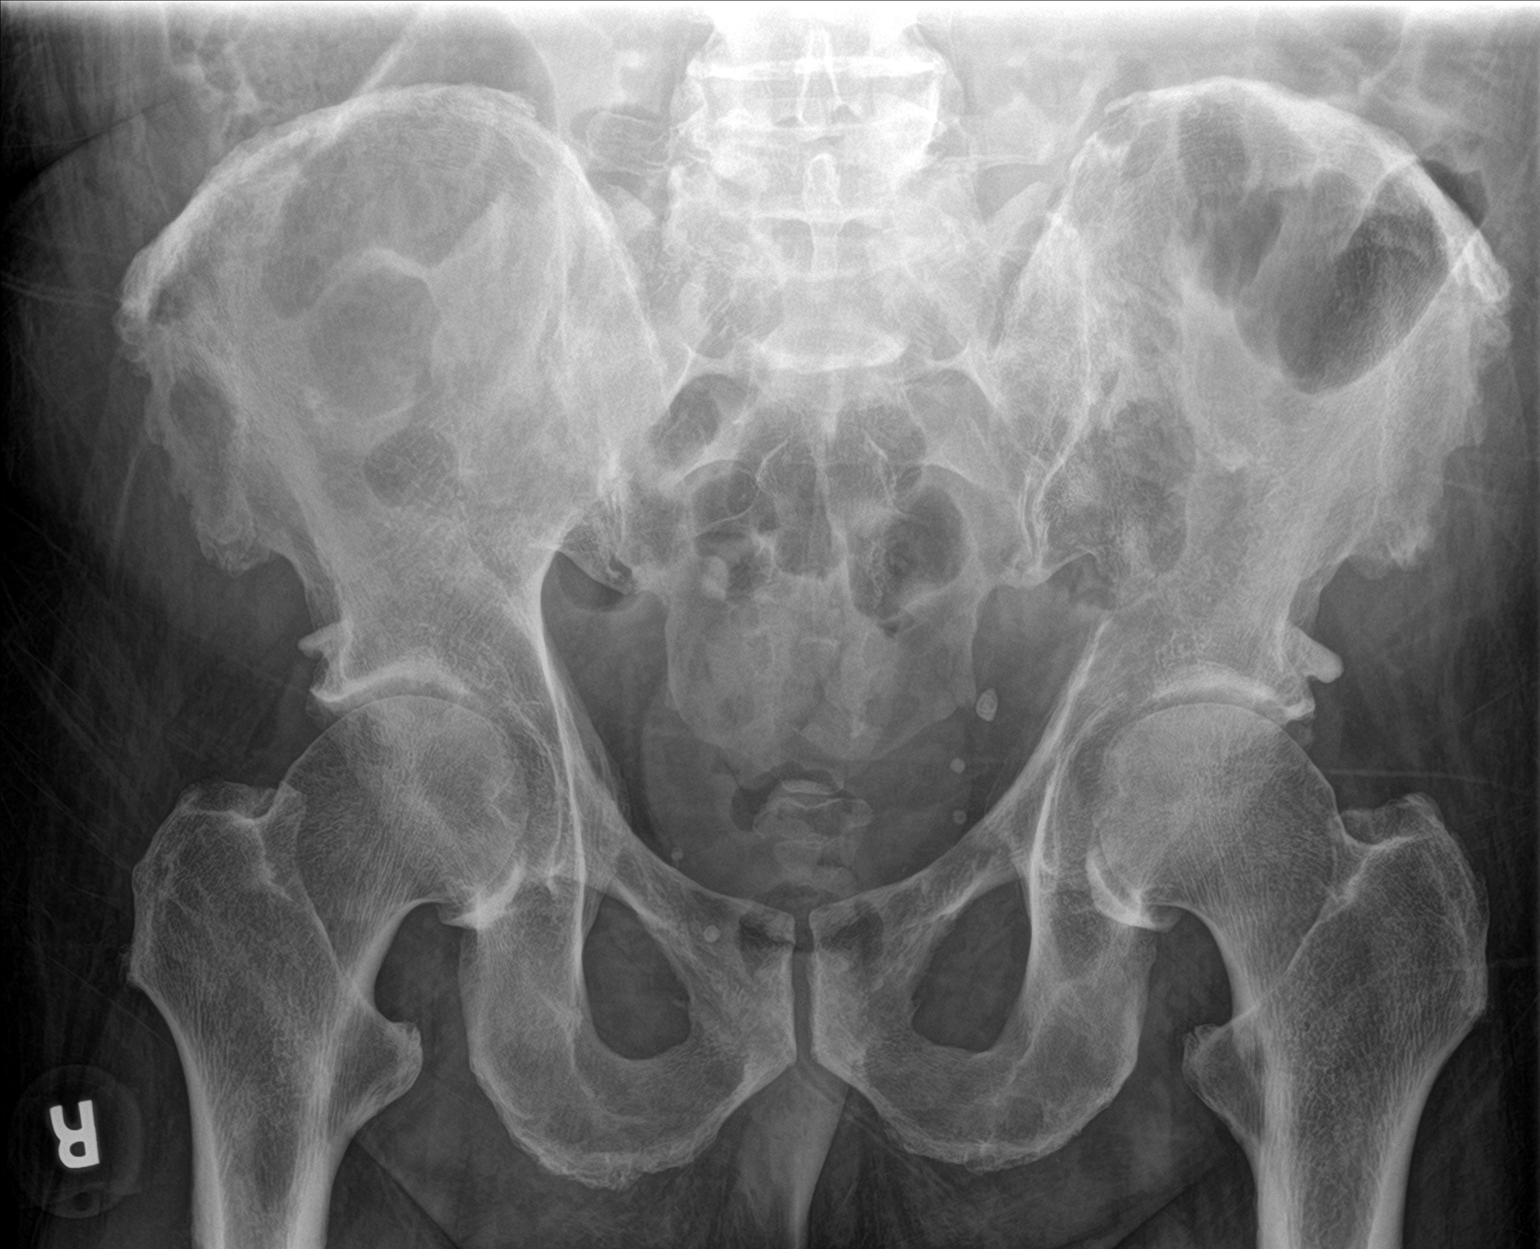

[3 of 3 positions shown; findings below may reference images not displayed]

FINDINGS: No fracture or dislocation of the bilateral hips. There is a large,
lytic lesion of the left ilium, better characterized by prior
PET-CT. There are no lesions of the bilateral proximal femurs
appreciated radiographically. Mild superior joint space height loss
and acetabular osteophytosis of the bilateral hips. Enthesopathic
change about the iliac crests.
IMPRESSION: 1. No fracture or dislocation of the bilateral hips. Mild arthrosis.
2. There is a large, lytic lesion of the left ilium, better
characterized by prior PET-CT. There are no lesions of the bilateral
proximal femurs appreciated radiographically. Contrast enhanced MRI
is the test of choice to assess for osseous metastatic lesions.

## 2019-12-23 IMAGING — CR DG HIP (WITH OR WITHOUT PELVIS) 2-3V*L*
1 series · 3 of 3 positions shown · non-contrast
Comparison: PET-CT, [DATE]

CLINICAL DATA: Bilateral hip pain, esophageal cancer with osseous
metastatic disease

EXAM:
DG HIP (WITH OR WITHOUT PELVIS) 2-3V LEFT; DG HIP (WITH OR WITHOUT
PELVIS) 2-3V RIGHT

[Series 1: dg hip unilat w or w/o pelvis 2-3 views  · non-contrast · 0.14mm/px · 3 of 3 slices shown]
[im 1/3]
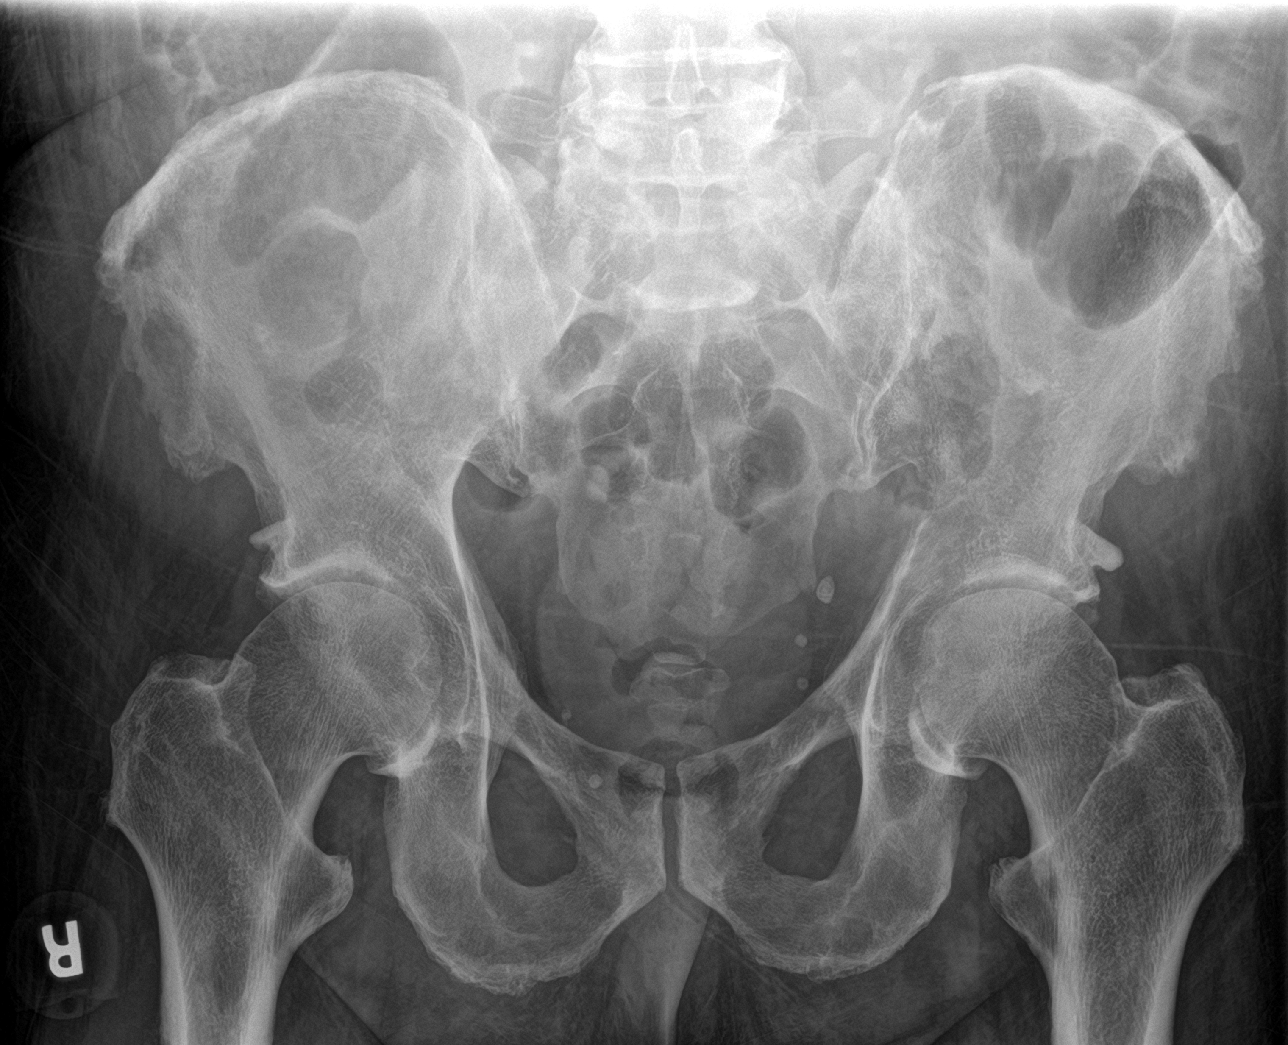
[im 2/3]
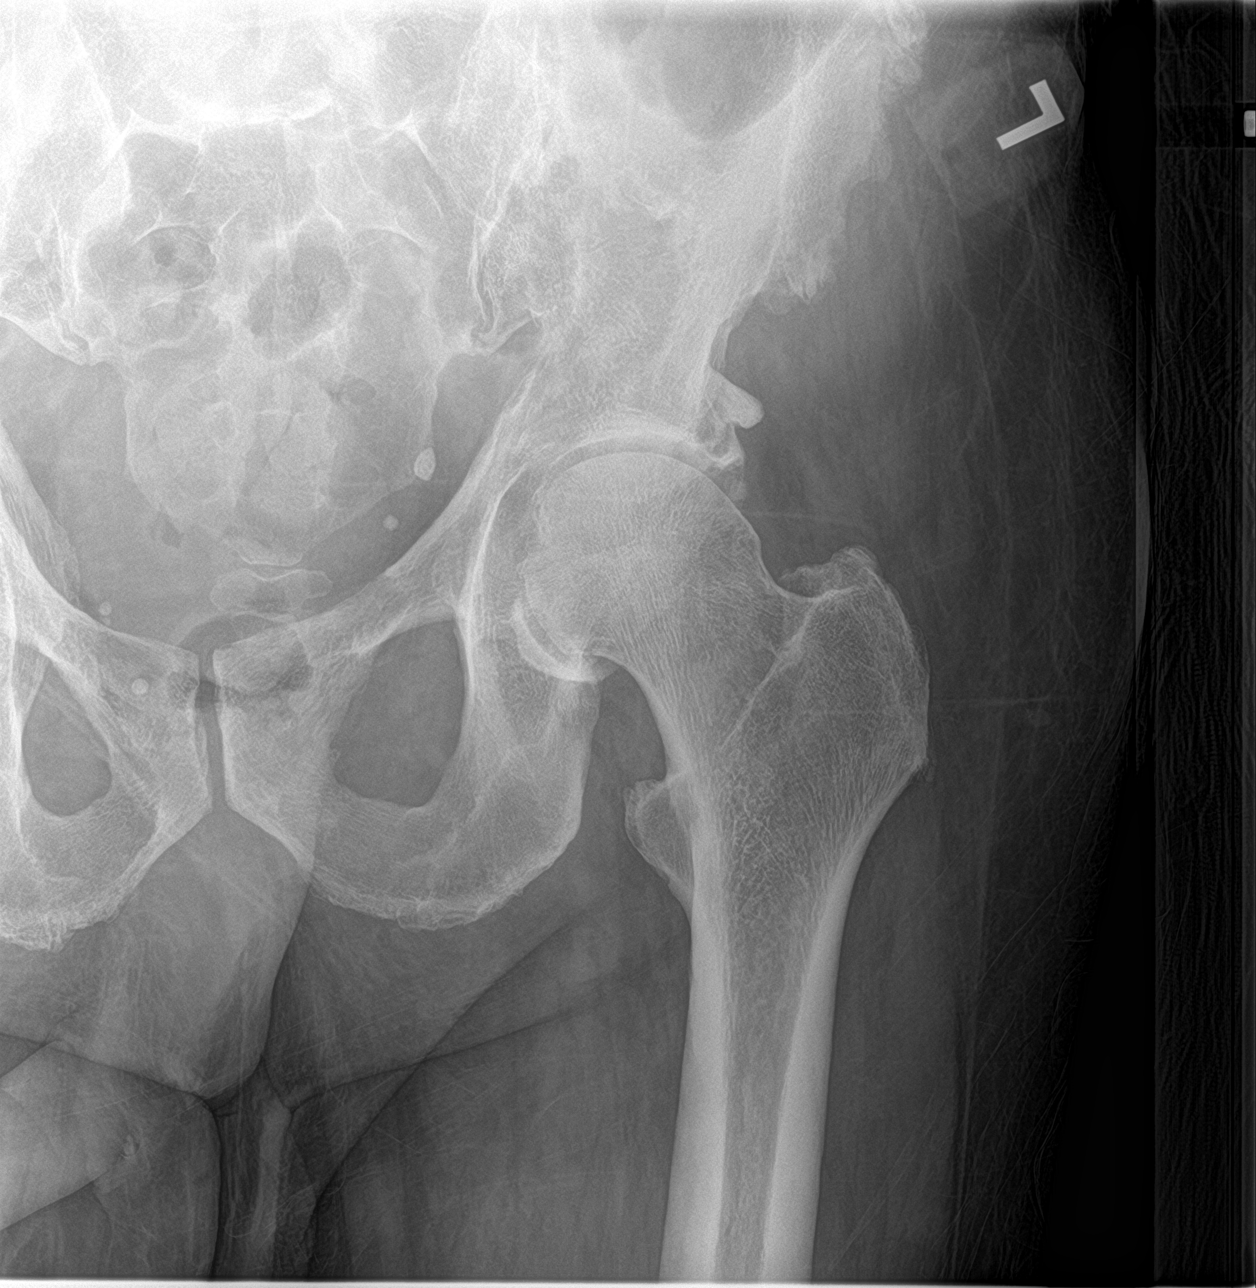
[im 3/3]
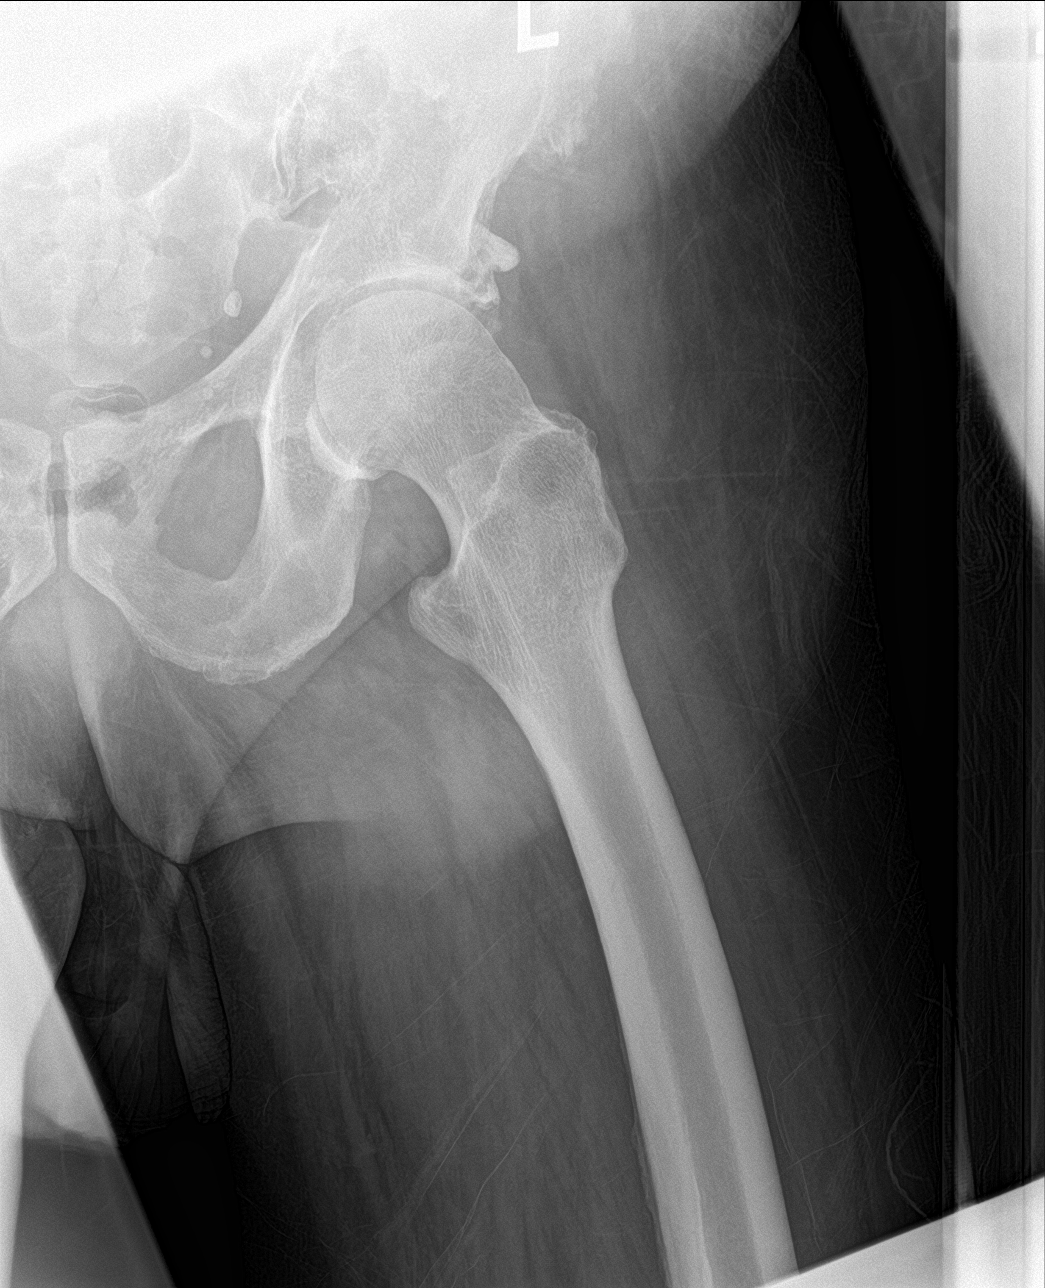

[3 of 3 positions shown; findings below may reference images not displayed]

FINDINGS: No fracture or dislocation of the bilateral hips. There is a large,
lytic lesion of the left ilium, better characterized by prior
PET-CT. There are no lesions of the bilateral proximal femurs
appreciated radiographically. Mild superior joint space height loss
and acetabular osteophytosis of the bilateral hips. Enthesopathic
change about the iliac crests.
IMPRESSION: 1. No fracture or dislocation of the bilateral hips. Mild arthrosis.
2. There is a large, lytic lesion of the left ilium, better
characterized by prior PET-CT. There are no lesions of the bilateral
proximal femurs appreciated radiographically. Contrast enhanced MRI
is the test of choice to assess for osseous metastatic lesions.

## 2019-12-23 MED ORDER — INFLUENZA VAC SPLIT QUAD 0.5 ML IM SUSY
0.5000 mL | PREFILLED_SYRINGE | INTRAMUSCULAR | Status: DC
  Filled 2019-12-23: qty 0.5

## 2019-12-23 MED ORDER — SUCRALFATE 1 GM/10ML PO SUSP
1.0000 g | Freq: Three times a day (TID) | ORAL | Status: DC
  Administered 2019-12-23 – 2019-12-27 (×17): 1 g via ORAL
  Filled 2019-12-23 (×17): qty 10

## 2019-12-23 NOTE — Progress Notes (Signed)
Occupational Therapy Treatment Patient Details Name: Jacob Henson MRN: [id number] DOB: October 30, 1960 Today's Date: 12/23/2019    History of present illness 59 y.o. male with PMH most significant for metastatic esophageal adenocarcinoma with bone metastasis, on active chemotherapy who presents to ER for progressive generalized weakness, nausea and vomiting after tube feeding   OT comments  Despite receiving pain meds 1 hr prior to session, Mr. Simonet described constant 10/10 pain in R shoulder, plus intermittent stabbing, shooting pains throughout R hip and LE, also 10/10. Mr. Paar stated this latter, shooting pain was new, beginning yesterday evening. Session focused on functional movement and positioning to address pain, as well as therex as tolerated for strengthening and endurance. Wife was present throughout session, and the couple expressed concern about next steps -- whether pt would benefit from SNF or if pt's needs could be meet at home. Recommend ongoing OT while pt is hospitalized, and, if D/C to home, then [facility] as well as other home-based services, both clinical and non-clinical. Recommend consult with palliative care.   Follow Up Recommendations  Supervision/Assistance - 24 hour;Home health OT    Equipment Recommendations  3 in 1 bedside commode    Recommendations for Other Services  (palliative care consult, possible hospice)    Precautions / Restrictions Precautions Precautions: Fall Restrictions Weight Bearing Restrictions: No Other Position/Activity Restrictions: RUE very painful at rest due to cancerous mass near R shoulder       Mobility Bed Mobility Overal bed mobility: Needs Assistance Bed Mobility: Supine to Sit;Sit to Supine     Supine to sit: Mod assist Sit to supine: Mod assist   General bed mobility comments: assist for trunk elevation. pt uses LUE only on L rail. Mod A for moving up towards [place] while supine, pt able to assist with  feet  Transfers Overall transfer level: Needs assistance Equipment used: Hemi-walker Transfers: Sit to/from Stand Sit to Stand: Max assist;From elevated surface Stand pivot transfers: Max assist      Lateral/Scoot Transfers: Mod assist General transfer comment: extreme pain with attempts to t/f    Balance Overall balance assessment: Needs assistance Sitting-balance support: Single extremity supported;Feet supported Sitting balance-Leahy Scale: Good Sitting balance - Comments: Pt able to maintain sitting balance confidently, able to adjust/scoot while sitting EOB with good confidence   Standing balance support: Single extremity supported Standing balance-Leahy Scale: Poor Standing balance comment: extreme pain with standing, unsteady on feet                           ADL either performed or assessed with clinical judgement   ADL Overall ADL's : Needs assistance/impaired   Eating/Feeding Details (indicate cue type and reason): With set up, able to suck on sponge. Grooming: Sitting;Set up;Minimal assistance                               Functional mobility during ADLs: Maximal assistance General ADL Comments: Max A 2/2 severe pain with movement     Vision Patient Visual Report: No change from baseline     Perception     Praxis      Cognition Arousal/Alertness: Awake/alert Behavior During Therapy: WFL for tasks assessed/performed Overall Cognitive Status: Within Functional Limits for tasks assessed  Exercises Total Joint Exercises Ankle Circles/Pumps: AROM;15 reps;Both;Seated Heel Slides: Both;Supine;10 reps Hip ABduction/ADduction: 15 reps;Strengthening;Both;Seated Straight Leg Raises: Strengthening;Both;Seated;15 reps Knee Flexion: Strengthening;Both;10 reps;Seated Marching in Standing: Strengthening;Seated;Both;10 reps Other Exercises Other Exercises: Bed mobility, t/f; Therex  for strengthening + pain mgmt; Positioning in sitting and supine for pain mgmt; Educ re: d/c   Shoulder Instructions       General Comments Pt and wife expressing great uncertainity about DC options    Pertinent Vitals/ Pain       Pain Assessment: 0-10 Pain Score: 10-Worst pain ever Pain Location: R shoulder and arm, constantly, with stabbing, shooting pains intermittently through R side Pain Intervention(s): Limited activity within patient's tolerance;Repositioned;Patient requesting pain meds-RN notified;Monitored during session;Relaxation  Home Living                                          Prior Functioning/Environment              Frequency  Min 2X/week        Progress Toward Goals  OT Goals(current goals can now be found in the care plan section)  Progress towards OT goals: Progressing toward goals  Acute Rehab OT Goals Patient Stated Goal: to go home OT Goal Formulation: With patient Time For Goal Achievement: 01/02/20 Potential to Achieve Goals: Good  Plan Frequency remains appropriate;Discharge plan needs to be updated    Co-evaluation                 AM-PAC OT "6 Clicks" Daily Activity     Outcome Measure   Help from another person eating meals?: A Little Help from another person taking care of personal grooming?: A Little Help from another person toileting, which includes using toliet, bedpan, or urinal?: A Lot Help from another person bathing (including washing, rinsing, drying)?: A Lot Help from another person to put on and taking off regular upper body clothing?: A Lot Help from another person to put on and taking off regular lower body clothing?: A Lot 6 Click Score: 14    End of Session Equipment Utilized During Treatment: Other (comment) (hemiwalker)  OT Visit Diagnosis: Other abnormalities of gait and mobility (R26.89)   Activity Tolerance Patient limited by pain   Patient Left in bed;with call bell/phone within  reach;with bed alarm set;with family/visitor present   Nurse Communication Patient requests pain meds        Time: 3888-2800 OT Time Calculation (min): 70 min  Charges: OT Treatments $Self Care/Home Management : 68-82 mins  Josiah Lobo, PhD, MS, OTR/L ascom [phone] 12/23/19, 4:05 PM

## 2019-12-23 NOTE — Progress Notes (Addendum)
PROGRESS NOTE    Jacob Henson  HKV:[id number] DOB: 1961-01-05 DOA: 12/19/2019 PCP: Elby Beck, FNP   Brief Narrative:  Jacob Henson is a 59 y.o.Caucasian malewith a known history of stage IVesophageal cancermetastatic to the bones, lungs and liveronactivechemotherapy and radiotherapy with last session of chemotherapy 1 week ago, and radiotherapy 2 weeks ago,GERD and hypertension,who presented to the emergency room with acute onset of progressive weakness over the last 3 days.   Assessment & Plan:  Generalized weakness likelysecondary to chemotherapyas well as mild acute kidney injury and dehydration: -PT OT eval, recommended SNF placement.  TOC consulted-working on patient's placement SNF versus home with home health -continue Zofran, Reglan, PPI, added Carafate for acid reflux. -continue to monitor vitals closely.  Hyponatremia likely hypovolemic due to volume depletion and anorexia -Continue free water flushes and monitor BMP.  Sodium 129 today. -Patient remains asymptomatic  Stage IV esophageal canceron activeradiotherapy andchemotherapy -Oncology following -Tube feeding management per dietitian -Continue chronic pain medications-oxycodone, morphine and fentanyl.  Pancytopenia -H&H, WBC and platelet: Stable.  Likely secondary to chemotherapy.  Continue to monitor CBC and fevers, bleeding.  Patient started empirically on Cipro due to leukopenia.  As per oncology note: Continue Cipro until [place] more than 500.  WBC is 6.3.  DC Cipro on 11/11-appreciate pharmacy help  CKD stage IIIa -Remains stable  Elevated troponin secondary to demand ischemia -Trend has been flat 22 --> 18.  Patient is asymptomatic.  Depression -Continue Zyprexa  DVT prophylaxis: Lovenox Code Status: Full code  family Communication: None present at bedside.  Plan of care discussed with patient in length and he verbalized understanding and agreed with it.  I tried to  call patient's wife this morning with no response.  I will try to call her again later this afternoon.  Disposition Plan: SNF versus home with home health  Consultants:  Oncology palliative care  Procedures:   None  Antimicrobials:  Cipro  Status is: Inpatient  Dispo: The patient is from: Home              Anticipated d/c is to: SNF versus home with home health              Anticipated d/c date is: 12/24/2019              Patient currently not medically stable for the discharge   Subjective: Patient seen and examined.  Tells me that he continues to have acid reflux symptoms even though he takes PPI.  Tells me that he continues to have generalized weakness however he thinks that it is improving slowly and gradually.  Objective: Vitals:   12/22/19 1948 12/23/19 0100 12/23/19 0453 12/23/19 0806  BP: 132/73 134/86 127/73 135/77  Pulse: (!) 110 61 (!) 108 (!) 106  Resp: 20 16 18 14   Temp: 97.6 F (36.4 C) 97.9 F (36.6 C) 98.1 F (36.7 C) 98.7 F (37.1 C)  TempSrc:    Oral  SpO2: 96% 96% 94% 96%  Weight:      Height:       No intake or output data in the 24 hours ending 12/23/19 1054 Filed Weights   12/19/19 0139  Weight: 129.3 kg    Examination:  General exam: Appears calm and comfortable, on room air, communicating well Respiratory system: Clear to auscultation. Respiratory effort normal. Cardiovascular system: S1 & S2 heard, RRR. No JVD, murmurs, rubs, gallops or clicks. No pedal edema. Gastrointestinal system: Abdomen is nondistended, soft and nontender. No  organomegaly or masses felt. Normal bowel sounds heard.  PEG tube noted.  No signs of active bleeding or discharge seen around the PEG tube area. Central nervous system: Alert and oriented. No focal neurological deficits. Extremities: Symmetric 5 x 5 power. Skin: No rashes, lesions or ulcers. Psychiatry: Judgement and insight appear normal. Mood & affect appropriate.   Data Reviewed: I have personally  reviewed following labs and imaging studies  CBC: Recent Labs  Lab 12/19/19 0150 12/19/19 0445 12/19/19 0453 12/20/19 0454 12/21/19 0622 12/22/19 0527 12/23/19 0627  WBC 1.0*   < > 1.2* 1.5* 2.5* 5.0 6.3  NEUTROABS 0.1*  --  0.2*  --   --   --   --   HGB 11.4*   < > 10.7* 10.0* 9.8* 10.1* 10.2*  HCT 34.3*   < > 33.6* 30.4* 29.8* 30.7* 31.0*  MCV 93.5   < > 95.2 93.0 93.1 92.2 92.0  PLT 130*   < > 127* 135* 175 197 201   < > = values in this interval not displayed.   Basic Metabolic Panel: Recent Labs  Lab 12/19/19 0445 12/20/19 0454 12/21/19 0622 12/22/19 0527 12/23/19 0627  NA 131* 134* 133* 130* 129*  K 4.2 4.0 3.7 4.1 4.0  CL 93* 98 97* 93* 91*  CO2 32 29 27 31 31   GLUCOSE 132* 127* 155* 131* 136*  BUN 23* 24* 25* 21* 20  CREATININE 1.44* 1.38* 1.20 1.06 1.10  CALCIUM 9.6 9.7 9.8 9.8 9.9  MG  --  1.9  --   --   --   PHOS  --  2.6  --   --   --    GFR: Estimated Creatinine Clearance: 107.6 mL/min (by C-G formula based on SCr of 1.1 mg/dL). Liver Function Tests: Recent Labs  Lab 12/19/19 0145 12/19/19 0445  AST 39 37  ALT 47* 44  ALKPHOS 84 78  BILITOT 1.1 1.1  PROT 6.4* 6.0*  ALBUMIN 2.7* 2.5*   No results for input(s): LIPASE, AMYLASE in the last 168 hours. No results for input(s): AMMONIA in the last 168 hours. Coagulation Profile: Recent Labs  Lab 12/19/19 0445  INR 1.3*   Cardiac Enzymes: No results for input(s): CKTOTAL, CKMB, CKMBINDEX, TROPONINI in the last 168 hours. BNP (last 3 results) No results for input(s): PROBNP in the last 8760 hours. HbA1C: No results for input(s): HGBA1C in the last 72 hours. CBG: Recent Labs  Lab 12/22/19 0914 12/22/19 1219 12/22/19 1611  GLUCAP 145* 146* 104*   Lipid Profile: No results for input(s): CHOL, HDL, LDLCALC, TRIG, CHOLHDL, LDLDIRECT in the last 72 hours. Thyroid Function Tests: No results for input(s): TSH, T4TOTAL, FREET4, T3FREE, THYROIDAB in the last 72 hours. Anemia Panel: No results  for input(s): VITAMINB12, FOLATE, FERRITIN, TIBC, IRON, RETICCTPCT in the last 72 hours. Sepsis Labs: No results for input(s): PROCALCITON, LATICACIDVEN in the last 168 hours.  Recent Results (from the past 240 hour(s))  Respiratory Panel by RT PCR (Flu A&B, Covid) - Nasopharyngeal Swab     Status: None   Collection Time: 12/19/19  1:50 AM   Specimen: Nasopharyngeal Swab  Result Value Ref Range Status   SARS Coronavirus 2 by RT PCR NEGATIVE NEGATIVE Final    Comment: (NOTE) SARS-CoV-2 target nucleic acids are NOT DETECTED.  The SARS-CoV-2 RNA is generally detectable in upper respiratoy specimens during the acute phase of infection. The lowest concentration of SARS-CoV-2 viral copies this assay can detect is 131 copies/mL. A negative result  does not preclude SARS-Cov-2 infection and should not be used as the sole basis for treatment or other patient management decisions. A negative result may occur with  improper specimen collection/handling, submission of specimen other than nasopharyngeal swab, presence of viral mutation(s) within the areas targeted by this assay, and inadequate number of viral copies (<131 copies/mL). A negative result must be combined with clinical observations, patient history, and epidemiological information. The expected result is Negative.  Fact Sheet for Patients:  PinkCheek.be  Fact Sheet for Healthcare Providers:  GravelBags.it  This test is no t yet approved or cleared by the [place] FDA and  has been authorized for detection and/or diagnosis of SARS-CoV-2 by FDA under an Emergency Use Authorization (EUA). This EUA will remain  in effect (meaning this test can be used) for the duration of the COVID-19 declaration under Section 564(b)(1) of the Act, 21 U.S.C. section 360bbb-3(b)(1), unless the authorization is terminated or revoked sooner.     Influenza A by PCR NEGATIVE NEGATIVE Final     Influenza B by PCR NEGATIVE NEGATIVE Final    Comment: (NOTE) The Xpert Xpress SARS-CoV-2/FLU/RSV assay is intended as an aid in  the diagnosis of influenza from Nasopharyngeal swab specimens and  should not be used as a sole basis for treatment. Nasal washings and  aspirates are unacceptable for Xpert Xpress SARS-CoV-2/FLU/RSV  testing.  Fact Sheet for Patients: PinkCheek.be  Fact Sheet for Healthcare Providers: GravelBags.it  This test is not yet approved or cleared by the [place] FDA and  has been authorized for detection and/or diagnosis of SARS-CoV-2 by  FDA under an Emergency Use Authorization (EUA). This EUA will remain  in effect (meaning this test can be used) for the duration of the  Covid-19 declaration under Section 564(b)(1) of the Act, 21  U.S.C. section 360bbb-3(b)(1), unless the authorization is  terminated or revoked. Performed at [facility], [address] [place], [place] [address]       Radiology Studies: No results found.  Scheduled Meds: . chlorhexidine  15 mL Mouth Rinse BID  . Chlorhexidine Gluconate Cloth  6 each Topical Daily  . ciprofloxacin  500 mg Oral BID  . enoxaparin (LOVENOX) injection  65 mg Subcutaneous Q24H  . feeding supplement (PROSource TF)  45 mL Per Tube BID  . fentaNYL  1 patch Transdermal Q72H  . free water  200 mL Per Tube Q4H  . [START ON 12/24/2019] influenza vac split quadrivalent PF  0.5 mL Intramuscular Tomorrow-1000  . lidocaine-prilocaine  1 application Topical Once  . mouth rinse  15 mL Mouth Rinse q12n4p  . metoCLOPramide  5 mg Oral TID AC  . OLANZapine  10 mg Oral QHS  . pantoprazole  40 mg Oral BID  . sennosides  10 mL Oral QHS  . sucralfate  1 g Oral TID WC & HS   Continuous Infusions: . feeding supplement (OSMOLITE 1.5 CAL) 80 mL/hr at 12/22/19 1400     LOS: 4 days   Time spent: 35 minutes   Tommey Barret Loann Quill, MD [facility]  If 7PM-7AM, please contact night-coverage [email or web address] 12/23/2019, 10:54 AM

## 2019-12-23 NOTE — Progress Notes (Signed)
Hematology/Oncology Consult note [facility]  Telephone:([phone] [phone]  Patient Care Team: Elby Beck, FNP as PCP - General (Nurse Practitioner) Clent Jacks, RN as Oncology Nurse Navigator Sindy Guadeloupe, MD as Consulting Physician (Oncology)   Name of the patient: Jacob Henson  [id number]  1960-12-06   Date of visit: 12/23/2019   Interval history- Feels quite fatigued. Could not participate with PT. Reports bilateral hip pain  ECOG PS- 3 Pain scale- 3 Opioid associated constipation- no  Review of systems- Review of Systems  Constitutional: Positive for malaise/fatigue and weight loss. Negative for chills and fever.  HENT: Negative for congestion, ear discharge and nosebleeds.   Eyes: Negative for blurred vision.  Respiratory: Negative for cough, hemoptysis, sputum production, shortness of breath and wheezing.   Cardiovascular: Negative for chest pain, palpitations, orthopnea and claudication.  Gastrointestinal: Positive for nausea and vomiting. Negative for abdominal pain, blood in stool, constipation, diarrhea, heartburn and melena.  Genitourinary: Negative for dysuria, flank pain, frequency, hematuria and urgency.  Musculoskeletal: Positive for joint pain. Negative for back pain and myalgias.  Skin: Negative for rash.  Neurological: Negative for dizziness, tingling, focal weakness, seizures, weakness and headaches.  Endo/Heme/Allergies: Does not bruise/bleed easily.  Psychiatric/Behavioral: Negative for depression and suicidal ideas. The patient does not have insomnia.     No Known Allergies   Past Medical History:  Diagnosis Date  . Allergy   . Cancer ([place])   . Esophageal cancer ([place])   . GERD (gastroesophageal reflux disease)    Has required esophageal dilation in past  . Hypertension      Past Surgical History:  Procedure Laterality Date  . ESOPHAGEAL DILATION    . PEG TUBE PLACEMENT  11/30/2019    . PORTA CATH INSERTION N/A 11/19/2019   Procedure: PORTA CATH INSERTION;  Surgeon: Algernon Huxley, MD;  Location: [facility];  Service: Cardiovascular;  Laterality: N/A;    Social History   Socioeconomic History  . Marital status: Married    Spouse name: Not on file  . Number of children: Not on file  . Years of education: Not on file  . Highest education level: Not on file  Occupational History  . Occupation: full time  Tobacco Use  . Smoking status: Former Smoker    Packs/day: 1.00    Years: 4.00    Pack years: 4.00    Types: Cigarettes    Quit date: 10/14/1978    Years since quitting: 41.2  . Smokeless tobacco: Never Used  Vaping Use  . Vaping Use: Never used  Substance and Sexual Activity  . Alcohol use: No  . Drug use: Never  . Sexual activity: Not Currently  Other Topics Concern  . Not on file  Social History Narrative  . Not on file   Social Determinants of Health   Financial Resource Strain:   . Difficulty of Paying Living Expenses: Not on file  Food Insecurity:   . Worried About Charity fundraiser in the Last Year: Not on file  . Ran Out of Food in the Last Year: Not on file  Transportation Needs:   . Lack of Transportation (Medical): Not on file  . Lack of Transportation (Non-Medical): Not on file  Physical Activity:   . Days of Exercise per Week: Not on file  . Minutes of Exercise per Session: Not on file  Stress:   . Feeling of Stress : Not on file  Social  Connections:   . Frequency of Communication with Friends and Family: Not on file  . Frequency of Social Gatherings with Friends and Family: Not on file  . Attends Religious Services: Not on file  . Active Member of Clubs or Organizations: Not on file  . Attends Archivist Meetings: Not on file  . Marital Status: Not on file  Intimate Partner Violence:   . Fear of Current or Ex-Partner: Not on file  . Emotionally Abused: Not on file  . Physically Abused: Not on file  .  Sexually Abused: Not on file    Family History  Problem Relation Age of Onset  . Heart disease Father   . Hearing loss Father   . Diabetes Father   . Heart disease Paternal Grandmother   . Diabetes Paternal Grandmother   . Heart attack Paternal Grandfather      Current Facility-Administered Medications:  .  acetaminophen (TYLENOL) tablet 650 mg, 650 mg, Oral, Q6H PRN **OR** acetaminophen (TYLENOL) suppository 650 mg, 650 mg, Rectal, Q6H PRN, Mansy, Jan A, MD .  chlorhexidine (PERIDEX) 0.12 % solution 15 mL, 15 mL, Mouth Rinse, BID, Lorella Nimrod, MD, 15 mL at 12/20/19 2236 .  Chlorhexidine Gluconate Cloth 2 % PADS 6 each, 6 each, Topical, Daily, Dessa Phi, DO, 6 each at 12/23/19 1231 .  ciprofloxacin (CIPRO) tablet 500 mg, 500 mg, Oral, BID, Earlie Server, MD, 500 mg at 12/23/19 0819 .  enoxaparin (LOVENOX) injection 65 mg, 65 mg, Subcutaneous, Q24H, Mansy, Jan A, MD, 65 mg at 12/23/19 1231 .  feeding supplement (OSMOLITE 1.5 CAL) liquid 1,000 mL, 1,000 mL, Per Tube, Continuous, Dessa Phi, DO, Last Rate: 80 mL/hr at 12/22/19 1400, Rate Verify at 12/22/19 1400 .  feeding supplement (PROSource TF) liquid 45 mL, 45 mL, Per Tube, BID, Dessa Phi, DO, 45 mL at 12/23/19 0821 .  fentaNYL (DURAGESIC) 12 MCG/HR 1 patch, 1 patch, Transdermal, Q72H, Mansy, Jan A, MD, 1 patch at 12/22/19 0541 .  free water 200 mL, 200 mL, Per Tube, Q4H, Choi, Jennifer, DO, 200 mL at 12/23/19 1400 .  [START ON 12/24/2019] influenza vac split quadrivalent PF (FLUARIX) injection 0.5 mL, 0.5 mL, Intramuscular, Tomorrow-1000, Belue, Alver Sorrow, RPH .  lidocaine-prilocaine (EMLA) cream 1 application, 1 application, Topical, Once, Mansy, Jan A, MD .  magnesium hydroxide (MILK OF MAGNESIA) suspension 30 mL, 30 mL, Oral, Daily PRN, Mansy, Jan A, MD .  MEDLINE mouth rinse, 15 mL, Mouth Rinse, q12n4p, Amin, Sumayya, MD, 15 mL at 12/21/19 1200 .  metoCLOPramide (REGLAN) 5 MG/5ML solution 5 mg, 5 mg, Oral, TID AC, Belue,  Alver Sorrow, RPH, 5 mg at 12/23/19 1231 .  morphine 2 MG/ML injection 2 mg, 2 mg, Intravenous, Q4H PRN, Mansy, Jan A, MD .  OLANZapine (ZYPREXA) tablet 10 mg, 10 mg, Oral, QHS, Mansy, Jan A, MD, 10 mg at 12/22/19 2128 .  ondansetron (ZOFRAN) tablet 4 mg, 4 mg, Oral, Q6H PRN **OR** ondansetron (ZOFRAN) injection 4 mg, 4 mg, Intravenous, Q6H PRN, Mansy, Jan A, MD .  oxyCODONE (ROXICODONE) 5 MG/5ML solution 5 mg, 5 mg, Oral, Q4H PRN, Mansy, Jan A, MD, 5 mg at 12/23/19 0819 .  pantoprazole (PROTONIX) EC tablet 40 mg, 40 mg, Oral, BID, Mansy, Jan A, MD, 40 mg at 12/23/19 0819 .  sennosides (SENOKOT) 8.8 MG/5ML syrup 10 mL, 10 mL, Oral, QHS, Mansy, Jan A, MD, 10 mL at 12/20/19 2236 .  sucralfate (CARAFATE) 1 GM/10ML suspension 1 g, 1 g, Oral, TID WC &  HS, Pahwani, Rinka R, MD, 1 g at 12/23/19 1231 .  traZODone (DESYREL) tablet 25 mg, 25 mg, Oral, QHS PRN, Mansy, Arvella Merles, MD  Physical exam:  Vitals:   12/23/19 0100 12/23/19 0453 12/23/19 0806 12/23/19 1155  BP: 134/86 127/73 135/77 127/68  Pulse: 61 (!) 108 (!) 106 (!) 108  Resp: 16 18 14 18   Temp: 97.9 F (36.6 C) 98.1 F (36.7 C) 98.7 F (37.1 C) 98.3 F (36.8 C)  TempSrc:   Oral   SpO2: 96% 94% 96% 95%  Weight:      Height:       Physical Exam Constitutional:      Comments: Appears fatigued  Cardiovascular:     Rate and Rhythm: Normal rate and regular rhythm.     Heart sounds: Normal heart sounds.  Pulmonary:     Effort: Pulmonary effort is normal.  Abdominal:     Palpations: Abdomen is soft.     Comments: Peg tube in place  Skin:    General: Skin is warm and dry.  Neurological:     Mental Status: He is alert and oriented to person, place, and time.      CMP Latest Ref Rng & Units 12/23/2019  Glucose 70 - 99 mg/dL 136(H)  BUN 6 - 20 mg/dL 20  Creatinine 0.61 - 1.24 mg/dL 1.10  Sodium 135 - 145 mmol/L 129(L)  Potassium 3.5 - 5.1 mmol/L 4.0  Chloride 98 - 111 mmol/L 91(L)  CO2 22 - 32 mmol/L 31  Calcium 8.9 - 10.3 mg/dL 9.9   Total Protein 6.5 - 8.1 g/dL -  Total Bilirubin 0.3 - 1.2 mg/dL -  Alkaline Phos 38 - 126 U/L -  AST 15 - 41 U/L -  ALT 0 - 44 U/L -   CBC Latest Ref Rng & Units 12/23/2019  WBC 4.0 - 10.5 K/uL 6.3  Hemoglobin 13.0 - 17.0 g/dL 10.2(L)  Hematocrit 39 - 52 % 31.0(L)  Platelets 150 - 400 K/uL 201    @IMAGES @  MR BRAIN W WO CONTRAST  Result Date: 12/20/2019 CLINICAL DATA:  Initial evaluation for intractable nausea, history of stage IV esophageal cancer. Evaluate for metastatic disease. EXAM: MRI HEAD WITHOUT AND WITH CONTRAST TECHNIQUE: Multiplanar, multiecho pulse sequences of the brain and surrounding structures were obtained without and with intravenous contrast. CONTRAST:  43mL GADAVIST GADOBUTROL 1 MMOL/ML IV SOLN COMPARISON:  None available. FINDINGS: Brain: Cerebral volume within normal limits for age. Few scattered foci of subcentimeter T2/FLAIR hyperintensity noted within the periventricular deep white matter both cerebral hemispheres, nonspecific, but most like related chronic microvascular ischemic disease, mild for age. Superimposed remote lacunar infarcts noted within the left thalamus and basal ganglia. No abnormal foci of restricted diffusion to suggest acute or subacute ischemia. Gray-white matter differentiation maintained. No encephalomalacia to suggest chronic cortical infarction elsewhere within the brain. No acute intracranial hemorrhage. Few punctate chronic micro hemorrhages noted within the cerebellum, brainstem, and about the deep gray nuclei, most likely related to chronic underlying hypertension. No mass lesion, midline shift or mass effect. No abnormal enhancement or evidence for intracranial metastatic disease. Ventricles normal size without hydrocephalus. No extra-axial fluid collection. Pituitary gland suprasellar region within normal limits. Midline structures intact. Vascular: Major intracranial vascular flow voids are maintained. Skull and upper cervical spine:  Craniocervical junction within normal limits. Bone marrow signal intensity normal. No visible focal marrow replacing lesion. No scalp soft tissue abnormality. Sinuses/Orbits: Globes orbital soft tissues within normal limits. Paranasal sinuses are largely  clear. No mastoid effusion. Inner ear structures within normal limits. Other: None. IMPRESSION: 1. No acute intracranial abnormality or evidence for intracranial metastatic disease. 2. Mild chronic microvascular ischemic disease with superimposed remote lacunar infarcts involving the left thalamus and basal ganglia. 3. Few punctate chronic micro hemorrhages involving the cerebellum, brainstem, and deep gray nuclei, most likely related to chronic underlying hypertension. Electronically Signed   By: Jeannine Boga M.D.   On: 12/20/2019 22:55   DG Chest Portable 1 View  Result Date: 12/19/2019 CLINICAL DATA:  Weakness. EXAM: PORTABLE CHEST 1 VIEW COMPARISON:  October 10, 2018.  PET-CT dated 11/23/2019 FINDINGS: There is a well-positioned right-sided Port-A-Cath. Again noted is a lytic lesion involving the right glenoid. There is no pneumothorax. No large pleural effusion. There is some atelectasis at the lung bases. There is a lytic lesion involving the posterior eighth rib on the right. The heart size is unremarkable. IMPRESSION: 1. No acute cardiopulmonary process. 2. Well-positioned right-sided Port-A-Cath. 3. Lytic lesion involving the right glenoid and right eighth rib. Electronically Signed   By: Constance Holster M.D.   On: 12/19/2019 02:37   DG Abd 2 Views  Result Date: 12/19/2019 CLINICAL DATA:  Generalized weakness EXAM: ABDOMEN - 2 VIEW COMPARISON:  None. FINDINGS: The bowel gas pattern is normal. Moderate amount of right colonic stool is present. There is no evidence of free air. No radio-opaque calculi or other significant radiographic abnormality is seen. IMPRESSION: Nonobstructive bowel gas pattern. Electronically Signed   By: Prudencio Pair  M.D.   On: 12/19/2019 16:30     Assessment and plan- Patient is a 59 y.o. male with metastatic esophageal cancer and bone mets admitted for refractory nausea/ vomiting  1. Nausea/vomiting- still having random episodes of nausea/vomiting. Last chemo more than 2 weeks ago. Symptoms not entirely explained by chemo. MRI brain showed no brain mets. Presently on tube feeds at 80 ml/hr and tolerating it relatively well although he did have episode of vomiting during my visit. On reglan, olanzapine and prn zofran  2. Hip pain- on fentanyl patch and prn oxycodone. Has known large pelvic lesion and right proximal femur involvement on recent PET scan. Will get plain films of bilateral femur and pelvis today  3. Metastatic esophageal cancer- chemo on hold until acute issues resolve. RT to esophageal mass started yesterday but unclear if it can continue with rehab  4. Disposition- OOB/OT PT. Patient agreeable to considering subacute rehab to improve his strength and endurance with hopes of eventual discharge to home. Will involve palliative care if [facility] goes [facility].   I have spoken to patient and wife in detail.    Visit Diagnosis 1. Weakness   2. Neutropenia, unspecified type ([place])   3. AKI (acute kidney injury) ([place])   4. Nausea with vomiting      Dr. Randa Evens, MD, MPH [facility] [phone] 12/23/2019 9:40 PM

## 2019-12-23 NOTE — Telephone Encounter (Signed)
Nutrition  RD planning to follow-up with patient today.  Noted hospital admission.  Patient being taken care of by inpatient RD team.   RD called wife and left message.  Contact number provided.   RD to follow-up next week.   Ellina Sivertsen B. Zenia Resides, [place], [place] Registered Dietitian [phone] (mobile)

## 2019-12-23 NOTE — TOC Progression Note (Addendum)
Transition of Care [facility]) - Progression Note    Patient Details  Name: Jacob Henson MRN: [id number] Date of Birth: August 04, 1960  Transition of Care [facility]) CM/SW [place], LCSW Phone Number: 12/23/2019, 11:17 AM  Clinical Narrative:   Bed offer at [facility]. CSW left voicemail for spouse requesting a return call.    1:00- Attempted another call to patient's wife, left another voicemail requesting a return call.   3:00- Called patient's wife, she is going to Radiation protection practitioner and let CSW know what she and patient decide. Asked if she wants CSW to extend bed search to [facility], she reported she does. CSW extended bed search. Explained that they will need to make a decision soon as patient is nearing being medically ready for discharge, she is agreeable to deciding tomorrow.     Expected Discharge Plan: [place] Barriers to Discharge: Continued Medical Work up  Expected Discharge Plan and Services Expected Discharge Plan: [place] arrangements for the past 2 months: Single Family Home                                       Social Determinants of Health (SDOH) Interventions    Readmission Risk Interventions No flowsheet data found.

## 2019-12-24 ENCOUNTER — Ambulatory Visit: Payer: 59

## 2019-12-24 ENCOUNTER — Encounter: Payer: Self-pay | Admitting: Family Medicine

## 2019-12-24 DIAGNOSIS — Z515 Encounter for palliative care: Secondary | ICD-10-CM | POA: Diagnosis not present

## 2019-12-24 DIAGNOSIS — C158 Malignant neoplasm of overlapping sites of esophagus: Secondary | ICD-10-CM | POA: Diagnosis not present

## 2019-12-24 DIAGNOSIS — N179 Acute kidney failure, unspecified: Secondary | ICD-10-CM | POA: Diagnosis not present

## 2019-12-24 DIAGNOSIS — E440 Moderate protein-calorie malnutrition: Secondary | ICD-10-CM | POA: Diagnosis not present

## 2019-12-24 LAB — BASIC METABOLIC PANEL
Anion gap: 6 (ref 5–15)
BUN: 20 mg/dL (ref 6–20)
CO2: 31 mmol/L (ref 22–32)
Calcium: 9.9 mg/dL (ref 8.9–10.3)
Chloride: 88 mmol/L — ABNORMAL LOW (ref 98–111)
Creatinine, Ser: 1.05 mg/dL (ref 0.61–1.24)
GFR, Estimated: >60 mL/min (ref >60)
Glucose, Bld: 136 mg/dL — ABNORMAL HIGH (ref 70–99)
Potassium: 4.2 mmol/L (ref 3.5–5.1)
Sodium: 125 mmol/L — ABNORMAL LOW (ref 135–145)

## 2019-12-24 LAB — SODIUM, URINE, RANDOM: Sodium, Ur: 24 mmol/L

## 2019-12-24 LAB — OSMOLALITY: Osmolality: 272 mOsm/kg — ABNORMAL LOW (ref 275–295)

## 2019-12-24 LAB — OSMOLALITY, URINE: Osmolality, Ur: 545 mOsm/kg (ref 300–900)

## 2019-12-24 MED ORDER — METOCLOPRAMIDE HCL 5 MG/5ML PO SOLN
10.0000 mg | Freq: Three times a day (TID) | ORAL | Status: DC
  Administered 2019-12-24 – 2019-12-27 (×9): 10 mg via ORAL
  Filled 2019-12-24 (×13): qty 10

## 2019-12-24 MED ORDER — FREE WATER
120.0000 mL | Status: DC
  Administered 2019-12-24 – 2019-12-27 (×19): 120 mL

## 2019-12-24 MED ORDER — OSMOLITE 1.5 CAL PO LIQD
1000.0000 mL | ORAL | Status: DC
  Administered 2019-12-24 – 2019-12-25 (×2): 1000 mL

## 2019-12-24 MED ORDER — SODIUM CHLORIDE 0.9 % IV SOLN
12.5000 mg | Freq: Four times a day (QID) | INTRAVENOUS | Status: DC | PRN
  Filled 2019-12-24: qty 0.5

## 2019-12-24 MED ORDER — PROSOURCE TF PO LIQD
45.0000 mL | Freq: Four times a day (QID) | ORAL | Status: DC
  Administered 2019-12-24 – 2019-12-27 (×11): 45 mL
  Filled 2019-12-24: qty 45

## 2019-12-24 MED ORDER — CALCIUM CARBONATE ANTACID 500 MG PO CHEW
1.0000 | CHEWABLE_TABLET | Freq: Two times a day (BID) | ORAL | Status: DC | PRN
  Administered 2019-12-24: 200 mg via ORAL
  Filled 2019-12-24: qty 1

## 2019-12-24 NOTE — Progress Notes (Signed)
Occupational Therapy Treatment Patient Details Name: Jacob Henson MRN: [id number] DOB: [birth year] Today's Date: 12/24/2019    History of present illness 59 y.o. male with PMH most significant for metastatic esophageal adenocarcinoma with bone metastasis, on active chemotherapy who presents to ER for progressive generalized weakness, nausea and vomiting after tube feeding   OT comments  Mr Mceachron was seen for OT treatment on this date. Upon arrival to room pt reclined in bed reporting pain and fatigue following radiation tx this AM. Pt instructed in HEP (bed level exercises - glute squeezes, AAROM shoulder flaxion, fist punps, L heel slides, elbow flexion/extension) and joint protection strategies. Pt return demonstrated exercises and verbalized plan to implement t/o day. Pt making good progress toward goals. Pt continues to benefit from skilled OT services to maximize return to PLOF and minimize risk of future falls, injury, caregiver burden, and readmission. Will continue to follow POC. Discharge recommendation of SNF appropriate.      Follow Up Recommendations  SNF    Equipment Recommendations  3 in 1 bedside commode    Recommendations for Other Services      Precautions / Restrictions Precautions Precautions: Fall       Mobility Bed Mobility    General bed mobility comments: Pt defered citing pain  Transfers  General transfer comment: Pt deferred citing pain and fatigue following radiation tx        ADL either performed or assessed with clinical judgement   ADL Overall ADL's : Needs assistance/impaired    General ADL Comments: SETUP + CGA self-drinking c RUE assisting LUE                Cognition Arousal/Alertness: Awake/alert Behavior During Therapy: WFL for tasks assessed/performed Overall Cognitive Status: Within Functional Limits for tasks assessed         Exercises Exercises: Other exercises Other Exercises Other Exercises: Pt educated re: d/c  recs, HEP (bed level exercises - glute squeezes, AAROM shoulder flaxion, fist punps, L heel slides, elbow flexion/extension), joint protection strategies Other Exercises: self-drinking           Pertinent Vitals/ Pain       Pain Assessment: Faces Faces Pain Scale: Hurts even more Pain Location: R shoulder and hip Pain Descriptors / Indicators: Discomfort;Grimacing Pain Intervention(s): Limited activity within patient's tolerance;Repositioned         Frequency  Min 2X/week        Progress Toward Goals  OT Goals(current goals can now be found in the care plan section)  Progress towards OT goals: Progressing toward goals  Acute Rehab OT Goals Patient Stated Goal: to go home OT Goal Formulation: With patient Time For Goal Achievement: 01/02/20 Potential to Achieve Goals: Good ADL Goals Pt Will Perform Grooming: with modified independence;sitting Pt Will Perform Lower Body Dressing: with min guard assist;sit to/from stand ( c LRAD PRN) Pt Will Transfer to Toilet: bedside commode;ambulating;with min guard assist ( c LRAD PRN)  Plan Discharge plan needs to be updated;Frequency remains appropriate       AM-PAC OT "6 Clicks" Daily Activity     Outcome Measure   Help from another person eating meals?: A Little Help from another person taking care of personal grooming?: A Little Help from another person toileting, which includes using toliet, bedpan, or urinal?: A Lot Help from another person bathing (including washing, rinsing, drying)?: A Lot Help from another person to put on and taking off regular upper body clothing?: A Lot Help from another person to  put on and taking off regular lower body clothing?: A Lot 6 Click Score: 14    End of Session    OT Visit Diagnosis: Other abnormalities of gait and mobility (R26.89)   Activity Tolerance Patient limited by pain   Patient Left in bed;with call bell/phone within reach;with bed alarm set   Nurse Communication           Time: 1125-1140 OT Time Calculation (min): 15 min  Charges: OT General Charges $OT Visit: 1 Visit OT Treatments $Self Care/Home Management : 8-22 mins  Dessie Coma, M.S. OTR/L  12/24/19, 1:57 PM  ascom [phone]

## 2019-12-24 NOTE — Progress Notes (Signed)
°   12/24/19 0110  Assess: MEWS Score  Temp 97.7 F (36.5 C)  BP 116/89  Pulse Rate (!) 114  Resp 20  SpO2 98 %  Assess: MEWS Score  MEWS Temp 0  MEWS Systolic 0  MEWS Pulse 2  MEWS RR 0  MEWS LOC 0  MEWS Score 2  MEWS Score Color Yellow  Assess: if the MEWS score is Yellow or Red  Were vital signs taken at a resting state? Yes  Focused Assessment Change from prior assessment (see assessment flowsheet)  Early Detection of Sepsis Score *See Row Information* Low  MEWS guidelines implemented *See Row Information* Yes  Treat  MEWS Interventions Escalated (See documentation below)  Pain Scale 0-10  Pain Score 0  Take Vital Signs  Increase Vital Sign Frequency  Yellow: Q 2hr X 2 then Q 4hr X 2, if remains yellow, continue Q 4hrs  Escalate  MEWS: Escalate Yellow: discuss with charge nurse/RN and consider discussing with provider and RRT  Notify: Charge Nurse/RN  Name of Charge Nurse/RN Notified Magdalen Spatz  Date Charge Nurse/RN Notified 12/24/19  Time Charge Nurse/RN Notified 0113  Document  Patient Outcome Not stable and remains on department

## 2019-12-24 NOTE — Progress Notes (Signed)
   12/24/19 0110  Assess: MEWS Score  Temp 97.7 F (36.5 C)  BP 116/89  Pulse Rate (!) 114  Resp 20  SpO2 98 %  Assess: MEWS Score  MEWS Temp 0  MEWS Systolic 0  MEWS Pulse 2  MEWS RR 0  MEWS LOC 0  MEWS Score 2  MEWS Score Color Yellow  Assess: if the MEWS score is Yellow or Red  Were vital signs taken at a resting state? Yes  Focused Assessment Change from prior assessment (see assessment flowsheet)  Early Detection of Sepsis Score *See Row Information* Low  MEWS guidelines implemented *See Row Information* Yes  Treat  MEWS Interventions Escalated (See documentation below)  Pain Scale 0-10  Pain Score 0  Take Vital Signs  Increase Vital Sign Frequency  Yellow: Q 2hr X 2 then Q 4hr X 2, if remains yellow, continue Q 4hrs  Escalate  MEWS: Escalate Yellow: discuss with charge nurse/RN and consider discussing with provider and RRT  Notify: Charge Nurse/RN  Name of Charge Nurse/RN Notified Magdalen Spatz  Date Charge Nurse/RN Notified 12/24/19  Time Charge Nurse/RN Notified 0113

## 2019-12-24 NOTE — Progress Notes (Signed)
[facility]) Hospital Liaison RN note:  Received new referral for [facility] out patient palliative program to follow post discharge from Altha Harm, NP. Patient information given to referral. Megan Hagwood, TOC notified. Plan is to discharge to [facility] this weekend or early next week. Will follow for disposition.  Thank you.  Zandra Abts, RN [facility] Liaison [phone]

## 2019-12-24 NOTE — Progress Notes (Signed)
Patient returned to floor at 0920. He is resting in bed with call bell in reach. Nutrition is currently infusing along with frequent flushes. Bed in low position.

## 2019-12-24 NOTE — Progress Notes (Signed)
[place]  [phone] [phone]   Name: BROC CASPERS Date: 12/24/2019 MRN: [id number]  DOB: Jan 23, 1961  Patient Care Team: Elby Beck, FNP as PCP - General (Nurse Practitioner) Clent Jacks, RN as Oncology Nurse Navigator Sindy Guadeloupe, MD as Consulting Physician (Oncology)    REASON FOR CONSULTATION: Jacob Henson is a 59 y.o. 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  Palliative care was consulted over address goals and manage ongoing symptoms..    CODE STATUS: Full code  PAST MEDICAL HISTORY: Past Medical History:  Diagnosis Date  . Allergy   . Cancer ([place])   . Esophageal cancer ([place])   . GERD (gastroesophageal reflux disease)    Has required esophageal dilation in past  . Hypertension     PAST SURGICAL HISTORY:  Past Surgical History:  Procedure Laterality Date  . ESOPHAGEAL DILATION    . PEG TUBE PLACEMENT  11/30/2019  . PORTA CATH INSERTION N/A 11/19/2019   Procedure: PORTA CATH INSERTION;  Surgeon: Algernon Huxley, MD;  Location: [facility];  Service: Cardiovascular;  Laterality: N/A;    HEMATOLOGY/ONCOLOGY HISTORY:  Oncology History  Esophageal adenocarcinoma ([place])  11/15/2019 Initial Diagnosis   Esophageal adenocarcinoma ([place])   11/22/2019 -  Chemotherapy   The patient had dexamethasone (DECADRON) 4 MG tablet, 8 mg, Oral, Daily, 1 of 1 cycle, Start date: --, End date: -- palonosetron (ALOXI) injection 0.25 mg, 0.25 mg, Intravenous,  Once, 2 of 6 cycles Administration: 0.25 mg (11/22/2019), 0.25 mg (12/07/2019) pegfilgrastim-cbqv (UDENYCA) injection 6 mg, 6 mg, Subcutaneous, Once, 1 of 5 cycles Administration: 6 mg (12/09/2019) leucovorin 1,000  mg in dextrose 5 % 250 mL infusion, 383 mg/m2 = 1,044 mg, Intravenous,  Once, 2 of 6 cycles Administration: 1,000 mg (11/22/2019), 1,000 mg (12/07/2019) oxaliplatin (ELOXATIN) 170 mg in dextrose 5 % 500 mL chemo infusion, 65 mg/m2 = 170 mg (100 % of original dose 65 mg/m2), Intravenous,  Once, 2 of 6 cycles Dose modification: 65 mg/m2 (original dose 65 mg/m2, Cycle 1, Reason: Other (see comments), Comment: Minimize toxicity in palliative setting) Administration: 170 mg (11/22/2019), 170 mg (12/07/2019) fluorouracil (ADRUCIL) chemo injection 1,000 mg, 383 mg/m2 = 1,050 mg, Intravenous,  Once, 2 of 6 cycles Administration: 1,000 mg (11/22/2019), 1,000 mg (12/07/2019) pembrolizumab (KEYTRUDA) 400 mg in sodium chloride 0.9 % 50 mL chemo infusion, 400 mg (100 % of original dose 400 mg), Intravenous, Once, 1 of 2 cycles Dose modification: 400 mg (original dose 400 mg, Cycle 2) Administration: 400 mg (12/09/2019) fluorouracil (ADRUCIL) 6,250 mg in sodium chloride 0.9 % 125 mL chemo infusion, 2,400 mg/m2 = 6,250 mg, Intravenous, 1 Day/Dose, 2 of 6 cycles Administration: 6,250 mg (11/22/2019), 6,250 mg (12/07/2019)  for chemotherapy treatment.      ALLERGIES:  has No Known Allergies.  MEDICATIONS:  Current Facility-Administered Medications  Medication Dose Route Frequency Provider Last Rate Last Admin  . acetaminophen (TYLENOL) tablet 650 mg  650 mg Oral Q6H PRN Mansy, Jan A, MD   650 mg at 12/24/19 1303   Or  . acetaminophen (TYLENOL) suppository 650 mg  650 mg Rectal Q6H PRN Mansy, Jan A, MD      . calcium carbonate (TUMS - dosed in mg elemental calcium) chewable tablet 200 mg of elemental  calcium  1 tablet Oral BID PRN Lang Snow, NP   200 mg of elemental calcium at 12/24/19 0312  . chlorhexidine (PERIDEX) 0.12 % solution 15 mL  15 mL Mouth Rinse BID Lorella Nimrod, MD   15 mL at 12/24/19 0927  . Chlorhexidine Gluconate Cloth 2 % PADS 6 each  6 each Topical Daily Dessa Phi, DO    6 each at 12/24/19 0959  . enoxaparin (LOVENOX) injection 65 mg  65 mg Subcutaneous Q24H Mansy, Jan A, MD   65 mg at 12/24/19 0937  . feeding supplement (OSMOLITE 1.5 CAL) liquid 1,000 mL  1,000 mL Per Tube Continuous Pahwani, Rinka R, MD 70 mL/hr at 12/24/19 0930 1,000 mL at 12/24/19 0930  . feeding supplement (PROSource TF) liquid 45 mL  45 mL Per Tube QID Pahwani, Rinka R, MD   45 mL at 12/24/19 1303  . fentaNYL (DURAGESIC) 12 MCG/HR 1 patch  1 patch Transdermal Q72H Mansy, Jan A, MD   1 patch at 12/22/19 0541  . free water 120 mL  120 mL Per Tube Q4H Pahwani, Rinka R, MD   120 mL at 12/24/19 1127  . influenza vac split quadrivalent PF (FLUARIX) injection 0.5 mL  0.5 mL Intramuscular Tomorrow-1000 Belue, Alver Sorrow, RPH      . lidocaine-prilocaine (EMLA) cream 1 application  1 application Topical Once Mansy, Jan A, MD      . magnesium hydroxide (MILK OF MAGNESIA) suspension 30 mL  30 mL Oral Daily PRN Mansy, Jan A, MD      . MEDLINE mouth rinse  15 mL Mouth Rinse q12n4p Lorella Nimrod, MD   15 mL at 12/24/19 1128  . metoCLOPramide (REGLAN) 5 MG/5ML solution 5 mg  5 mg Oral TID AC Belue, Alver Sorrow, RPH   5 mg at 12/24/19 1300  . morphine 2 MG/ML injection 2 mg  2 mg Intravenous Q4H PRN Mansy, Jan A, MD      . OLANZapine (ZYPREXA) tablet 10 mg  10 mg Oral QHS Mansy, Jan A, MD   10 mg at 12/23/19 2204  . ondansetron (ZOFRAN) tablet 4 mg  4 mg Oral Q6H PRN Mansy, Jan A, MD   4 mg at 12/23/19 2231   Or  . ondansetron [facility]) injection 4 mg  4 mg Intravenous Q6H PRN Mansy, Jan A, MD   4 mg at 12/24/19 0928  . oxyCODONE (ROXICODONE) 5 MG/5ML solution 5 mg  5 mg Oral Q4H PRN Mansy, Jan A, MD   5 mg at 12/24/19 0927  . pantoprazole (PROTONIX) EC tablet 40 mg  40 mg Oral BID Mansy, Jan A, MD   40 mg at 12/24/19 0927  . sennosides (SENOKOT) 8.8 MG/5ML syrup 10 mL  10 mL Oral QHS Mansy, Jan A, MD   10 mL at 12/23/19 2204  . sucralfate (CARAFATE) 1 GM/10ML suspension 1 g  1 g Oral TID WC & HS Pahwani, Rinka R,  MD   1 g at 12/24/19 1300  . traZODone (DESYREL) tablet 25 mg  25 mg Oral QHS PRN Mansy, Arvella Merles, MD        VITAL SIGNS: BP 122/75 (BP Location: Right Arm)   Pulse (!) 109   Temp 97.8 F (36.6 C) (Oral)   Resp 18   Ht 6\' 5"  (1.956 m)   Wt 285 lb (129.3 kg)   SpO2 97%   BMI 33.80 kg/m  Filed Weights   12/19/19 0139  Weight: 285 lb (129.3 kg)  Estimated body mass index is 33.8 kg/m as calculated from the following:   Height as of this encounter: 6\' 5"  (1.956 m).   Weight as of this encounter: 285 lb (129.3 kg).  LABS: CBC:    Component Value Date/Time   WBC 6.3 12/23/2019 0627   HGB 10.2 (L) 12/23/2019 0627   HCT 31.0 (L) 12/23/2019 0627   PLT 201 12/23/2019 0627   MCV 92.0 12/23/2019 0627   NEUTROABS 0.2 (LL) 12/19/2019 0453   LYMPHSABS 0.5 (L) 12/19/2019 0453   MONOABS 0.5 12/19/2019 0453   EOSABS 0.0 12/19/2019 0453   BASOSABS 0.0 12/19/2019 0453   Comprehensive Metabolic Panel:    Component Value Date/Time   NA 125 (L) 12/24/2019 0459   NA 134 (A) 10/20/2019 0000   K 4.2 12/24/2019 0459   CL 88 (L) 12/24/2019 0459   CO2 31 12/24/2019 0459   BUN 20 12/24/2019 0459   BUN 29 (A) 10/20/2019 0000   CREATININE 1.05 12/24/2019 0459   GLUCOSE 136 (H) 12/24/2019 0459   CALCIUM 9.9 12/24/2019 0459   AST 37 12/19/2019 0445   ALT 44 12/19/2019 0445   ALKPHOS 78 12/19/2019 0445   BILITOT 1.1 12/19/2019 0445   PROT 6.0 (L) 12/19/2019 0445   ALBUMIN 2.5 (L) 12/19/2019 0445    RADIOGRAPHIC STUDIES: MR BRAIN W WO CONTRAST  Result Date: 12/20/2019 CLINICAL DATA:  Initial evaluation for intractable nausea, history of stage IV esophageal cancer. Evaluate for metastatic disease. EXAM: MRI HEAD WITHOUT AND WITH CONTRAST TECHNIQUE: Multiplanar, multiecho pulse sequences of the brain and surrounding structures were obtained without and with intravenous contrast. CONTRAST:  71mL GADAVIST GADOBUTROL 1 MMOL/ML IV SOLN COMPARISON:  None available. FINDINGS: Brain: Cerebral volume  within normal limits for age. Few scattered foci of subcentimeter T2/FLAIR hyperintensity noted within the periventricular deep white matter both cerebral hemispheres, nonspecific, but most like related chronic microvascular ischemic disease, mild for age. Superimposed remote lacunar infarcts noted within the left thalamus and basal ganglia. No abnormal foci of restricted diffusion to suggest acute or subacute ischemia. Gray-white matter differentiation maintained. No encephalomalacia to suggest chronic cortical infarction elsewhere within the brain. No acute intracranial hemorrhage. Few punctate chronic micro hemorrhages noted within the cerebellum, brainstem, and about the deep gray nuclei, most likely related to chronic underlying hypertension. No mass lesion, midline shift or mass effect. No abnormal enhancement or evidence for intracranial metastatic disease. Ventricles normal size without hydrocephalus. No extra-axial fluid collection. Pituitary gland suprasellar region within normal limits. Midline structures intact. Vascular: Major intracranial vascular flow voids are maintained. Skull and upper cervical spine: Craniocervical junction within normal limits. Bone marrow signal intensity normal. No visible focal marrow replacing lesion. No scalp soft tissue abnormality. Sinuses/Orbits: Globes orbital soft tissues within normal limits. Paranasal sinuses are largely clear. No mastoid effusion. Inner ear structures within normal limits. Other: None. IMPRESSION: 1. No acute intracranial abnormality or evidence for intracranial metastatic disease. 2. Mild chronic microvascular ischemic disease with superimposed remote lacunar infarcts involving the left thalamus and basal ganglia. 3. Few punctate chronic micro hemorrhages involving the cerebellum, brainstem, and deep gray nuclei, most likely related to chronic underlying hypertension. Electronically Signed   By: Jeannine Boga M.D.   On: 12/20/2019 22:55    DG Chest Portable 1 View  Result Date: 12/19/2019 CLINICAL DATA:  Weakness. EXAM: PORTABLE CHEST 1 VIEW COMPARISON:  October 10, 2018.  PET-CT dated 11/23/2019 FINDINGS: There is a well-positioned right-sided Port-A-Cath. Again noted is a lytic lesion involving the  right glenoid. There is no pneumothorax. No large pleural effusion. There is some atelectasis at the lung bases. There is a lytic lesion involving the posterior eighth rib on the right. The heart size is unremarkable. IMPRESSION: 1. No acute cardiopulmonary process. 2. Well-positioned right-sided Port-A-Cath. 3. Lytic lesion involving the right glenoid and right eighth rib. Electronically Signed   By: Constance Holster M.D.   On: 12/19/2019 02:37   DG Abd 2 Views  Result Date: 12/19/2019 CLINICAL DATA:  Generalized weakness EXAM: ABDOMEN - 2 VIEW COMPARISON:  None. FINDINGS: The bowel gas pattern is normal. Moderate amount of right colonic stool is present. There is no evidence of free air. No radio-opaque calculi or other significant radiographic abnormality is seen. IMPRESSION: Nonobstructive bowel gas pattern. Electronically Signed   By: Prudencio Pair M.D.   On: 12/19/2019 16:30   DG HIP UNILAT WITH PELVIS 2-3 VIEWS LEFT  Result Date: 12/23/2019 CLINICAL DATA:  Bilateral hip pain, esophageal cancer with osseous metastatic disease EXAM: DG HIP (WITH OR WITHOUT PELVIS) 2-3V LEFT; DG HIP (WITH OR WITHOUT PELVIS) 2-3V RIGHT COMPARISON:  PET-CT, 11/23/2019 FINDINGS: No fracture or dislocation of the bilateral hips. There is a large, lytic lesion of the left ilium, better characterized by prior PET-CT. There are no lesions of the bilateral proximal femurs appreciated radiographically. Mild superior joint space height loss and acetabular osteophytosis of the bilateral hips. Enthesopathic change about the iliac crests. IMPRESSION: 1. No fracture or dislocation of the bilateral hips. Mild arthrosis. 2. There is a large, lytic lesion of the left  ilium, better characterized by prior PET-CT. There are no lesions of the bilateral proximal femurs appreciated radiographically. Contrast enhanced MRI is the test of choice to assess for osseous metastatic lesions. Electronically Signed   By: Eddie Candle M.D.   On: 12/23/2019 18:57   DG HIP UNILAT WITH PELVIS 2-3 VIEWS RIGHT  Result Date: 12/23/2019 CLINICAL DATA:  Bilateral hip pain, esophageal cancer with osseous metastatic disease EXAM: DG HIP (WITH OR WITHOUT PELVIS) 2-3V LEFT; DG HIP (WITH OR WITHOUT PELVIS) 2-3V RIGHT COMPARISON:  PET-CT, 11/23/2019 FINDINGS: No fracture or dislocation of the bilateral hips. There is a large, lytic lesion of the left ilium, better characterized by prior PET-CT. There are no lesions of the bilateral proximal femurs appreciated radiographically. Mild superior joint space height loss and acetabular osteophytosis of the bilateral hips. Enthesopathic change about the iliac crests. IMPRESSION: 1. No fracture or dislocation of the bilateral hips. Mild arthrosis. 2. There is a large, lytic lesion of the left ilium, better characterized by prior PET-CT. There are no lesions of the bilateral proximal femurs appreciated radiographically. Contrast enhanced MRI is the test of choice to assess for osseous metastatic lesions. Electronically Signed   By: Eddie Candle M.D.   On: 12/23/2019 18:57    PERFORMANCE STATUS (ECOG) : 3 - Symptomatic, >50% confined to bed  Review of Systems Unless otherwise noted, a complete review of systems is negative.  Physical Exam General: NAD Pulmonary: unlabored Abdomen: PEG noted but not visualized Extremities: no edema, no joint deformities Skin: no rashes Neurological: Weakness but otherwise nonfocal  IMPRESSION: Follow-up visit.    Patient reports that he did not sleep well last night due to frequent reflux.  He tried to eat some by mouth today but it resulted in vomiting.  I note that his TF rate has been reduced to 70 mL/h.  Could  try increasing his dose of metoclopramide to 10 mg 3 times daily.  Plan is for SNF ([facility]).  I sent a message to community palliative care NP asking for patient to be followed at SNF.  PLAN: -Continue current scope of treatment -Consider increasing metoclopramide dose due to reflux symptoms -Rehab with PC following   Time Total: 15 minutes  Visit consisted of counseling and education dealing with the complex and emotionally intense issues of symptom management and palliative care in the setting of serious and potentially life-threatening illness.Greater than 50%  of this time was spent counseling and coordinating care related to the above assessment and plan.  Signed by: Altha Harm, PhD, NP-C

## 2019-12-24 NOTE — Progress Notes (Signed)
Physical Therapy Treatment Patient Details Name: Jacob Henson MRN: [id number] DOB: 11/11/1960 Today's Date: 12/24/2019    History of Present Illness 59 y.o. male with PMH most significant for metastatic esophageal adenocarcinoma with bone metastasis, on active chemotherapy who presents to ER for progressive generalized weakness, nausea and vomiting after tube feeding    PT Comments    Pt reporting significant pain but agreeable to tx. Made note for PT team to speak with nursing to try to have pt premedicated prior to future sessions.  Pt initially requesting to sit EOB but when attempting to move RLE pt notes too much pain to attempt & requests to perform bed level exercises instead. Pt performs ankle pumps (1 set x 10 reps), glute sets, heel slides (AAROM RLE), short arc quads (AAROM RLE), hip adduction squeezes all 2 sets x 10 reps for LE strengthening.   Pt breathing through mouth vs nose but SpO2 >90%, HR = 104 bpm supine in bed - nurse made aware.   Will continue to follow pt acutely to progress functional mobility.     Follow Up Recommendations  SNF;Supervision for mobility/OOB     Equipment Recommendations   (tall hemiwalker)    Recommendations for Other Services       Precautions / Restrictions Precautions Precautions: Fall Restrictions Other Position/Activity Restrictions: RUE very painful at rest due to cancerous mass near R shoulder    Mobility  Bed Mobility               General bed mobility comments: Pt defered citing pain  Transfers                 General transfer comment: Pt deferred citing pain and fatigue following radiation tx  Ambulation/Gait                 Stairs             Wheelchair Mobility    Modified Rankin (Stroke Patients Only)       Balance                                            Cognition Arousal/Alertness: Awake/alert Behavior During Therapy: WFL for tasks  assessed/performed Overall Cognitive Status: Within Functional Limits for tasks assessed                                        Exercises Other Exercises Other Exercises: Pt educated re: d/c recs, HEP (bed level exercises - glute squeezes, AAROM shoulder flaxion, fist punps, L heel slides, elbow flexion/extension), joint protection strategies Other Exercises: self-drinking    General Comments        Pertinent Vitals/Pain Pain Assessment: 0-10 Pain Score: 8  Faces Pain Scale: Hurts even more Pain Location: R hip Pain Descriptors / Indicators: Discomfort Pain Intervention(s): Limited activity within patient's tolerance;Patient requesting pain meds-RN notified    Home Living                      Prior Function            PT Goals (current goals can now be found in the care plan section) Acute Rehab PT Goals Patient Stated Goal: to go home PT Goal Formulation: With patient Time For Goal Achievement: 01/03/20  Potential to Achieve Goals: Good Progress towards PT goals: Progressing toward goals    Frequency    Min 2X/week      PT Plan Current plan remains appropriate    Co-evaluation              AM-PAC PT "6 Clicks" Mobility   Outcome Measure  Help needed turning from your back to your side while in a flat bed without using bedrails?: A Little Help needed moving from lying on your back to sitting on the side of a flat bed without using bedrails?: A Lot Help needed moving to and from a bed to a chair (including a wheelchair)?: A Lot Help needed standing up from a chair using your arms (e.g., wheelchair or bedside chair)?: A Little Help needed to walk in hospital room?: A Lot Help needed climbing 3-5 steps with a railing? : A Lot 6 Click Score: 14    End of Session   Activity Tolerance: Patient limited by pain Patient left: with bed alarm set;with call bell/phone within reach;in bed Nurse Communication:  (HR, pain) PT Visit  Diagnosis: Unsteadiness on feet (R26.81);Other abnormalities of gait and mobility (R26.89);Muscle weakness (generalized) (M62.81);History of falling (Z91.81);Pain Pain - Right/Left: Right Pain - part of body: Hip     Time: 1856-3149 PT Time Calculation (min) (ACUTE ONLY): 27 min  Charges:  $Therapeutic Exercise: 23-37 mins                     Lavone Nian, PT, DPT 12/24/19, 3:41 PM    Waunita Schooner 12/24/2019, 3:39 PM

## 2019-12-24 NOTE — Progress Notes (Signed)
Assisted with movement of patient to transport bed for radiation treatment. Mask applied to face. Patient has left floor. Charge nurse has been informed.

## 2019-12-24 NOTE — Progress Notes (Signed)
Nutrition Follow-up  DOCUMENTATION CODES:   Non-severe (moderate) malnutrition in context of chronic illness  INTERVENTION:  Initiate new goal TF regimen of Osmolite 1.5 Cal at 70 mL/hr (1680 mL goal daily volume) + PROSource 45 mL QID per tube. Provides 2680 kcal, 149 grams of protein, 1277 mL H2O daily.  Reduce free water flush to 120 mL Q4hrs. This provides a total of 1997 mL H2O daily including water in tube feeding regimen.  NUTRITION DIAGNOSIS:   Moderate Malnutrition related to chronic illness (stage IV esophageal cancer) as evidenced by moderate fat depletion, mild muscle depletion, moderate muscle depletion, percent weight loss.  Ongoing.  GOAL:   Patient will meet greater than or equal to 90% of their needs  Met with TF regimen.  MONITOR:   Labs, Weight trends, TF tolerance, I & O's  REASON FOR ASSESSMENT:   Consult Enteral/tube feeding initiation and management  ASSESSMENT:   59 y.o. 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  RD received secure chat from MD requesting a lower tube feed rate as patient continues to have acid reflux symptoms even with PPI, Carafate, and Reglan. Also discussed reducing free water flush as patient's sodium is trending down with current free water flush.  Met with patient at bedside. He reports over the last 3 days he has developed acid reflux symptoms. He has been unable to sleep well related to this. Discussed plan to reduce rate, increase, protein modulars, and decrease free water. Patient agreeable to plan. Patient attempted PO intake but did not tolerate.   Medications reviewed and include: fentanyl patch, Reglan 5 mg TID, Protonix, sennosides 10 mL QHS, Carafate 1 gram TID and QHS.  Labs reviewed: CBG  104-146, Sodium 129, Chloride 91.  I/O: 1650 mL UOP yesterday (0.5 mL/kg/hr)  RD obtained bed scale weight today of 119.2 kg (262.79 lbs). Suspect weight of exactly 285 lbs on 11/7 was estimated and not truly measured as wt taken today is closer to weights in late October.  Enteral Access: 18 Fr. G-tube present on admission  TF regimen: Osmolite 1.5 Cal at 80 mL/hr + PROSource TF 45 mL BID per tube  Discussed with RN via secure chat.  Diet Order:   Diet Order            DIET SOFT Room service appropriate? Yes; Fluid consistency: Thin  Diet effective now                EDUCATION NEEDS:   No education needs have been identified at this time  Skin:  Skin Assessment: Reviewed RN Assessment  Last BM:  12/22/2019 per chart  Height:   Ht Readings from Last 1 Encounters:  12/19/19 6' 5"  (1.956 m)   Weight:   Wt Readings from Last 1 Encounters:  12/24/19 119.2 kg   BMI:  Body mass index is 31.16 kg/m.  Estimated Nutritional Needs:   Kcal:  2700-3000kcal/day  Protein:  135-150g/day  Fluid:  2-2.4 L/day  Jacklynn Barnacle, MS, RD, LDN Pager number available on Amion

## 2019-12-24 NOTE — Progress Notes (Addendum)
PROGRESS NOTE    Jacob Henson  MPN:[id number] DOB: 11/30/1960 DOA: 12/19/2019 PCP: Elby Beck, FNP   Brief Narrative:  YAZAN GATLING is a 59 y.o.Caucasian malewith a known history of stage IVesophageal cancermetastatic to the bones, lungs and liveronactivechemotherapy and radiotherapy with last session of chemotherapy 1 week ago, and radiotherapy 2 weeks ago,GERD and hypertension,who presented to the emergency room with acute onset of progressive weakness over the last 3 days.   Assessment & Plan:  Generalized weakness likelysecondary to chemotherapyas well as mild acute kidney injury and dehydration: -PT OT eval, recommended SNF placement.  TOC consulted-working on patient's placement SNF versus home with home health -continue Zofran, Reglan, PPI, added Carafate for acid reflux. -continue to monitor vitals closely.  Nausea and vomiting: Continues to have episodes even though he is on Zofran, Reglan, olanzapine. -His last chemo was more than 2 weeks ago.  He is getting radiation therapy every day.  MRI brain showed no brain mets. -Continue with the tube feedings.  Dietitian on board.  Discussed with the dietitian about-decrease tube feeding rate considering his continuous acid reflux symptoms-she will look into it about his total protein calorie requirement per day. -Agree to  increase Reglan dose.  Hyponatremia likely hypovolemic due to volume depletion and anorexia -Likely in the setting of nausea and vomiting.  Discussed with dietitian-decrease free water flushes. - BMP shows sodium 125 today. -Patient remains asymptomatic -Check urine sodium, serum osmolality, urine osmolality.  Metastatic stage IV esophageal canceron activeradiotherapy andchemotherapy -Oncology following -Tube feeding management per dietitian -Continue chronic pain medications-oxycodone, morphine and fentanyl.  Pancytopenia -H&H, WBC and platelet: Stable.  Likely secondary to  chemotherapy.  Continue to monitor CBC and fevers, bleeding.  Patient started empirically on Cipro due to leukopenia.  As per oncology note: Continue Cipro until [place] more than 500.  WBC is 6.3.  DC Cipro on 11/11-appreciate pharmacy help  Hip pain: Reviewed x-ray from yesterday which shows no fracture or dislocation of bilateral hips.  Mild arthrosis.  There is a large lytic lesion of the left ilium -Continue with the pain medications.   -Continue PT  CKD stage IIIa -Remains stable  Elevated troponin secondary to demand ischemia -Trend has been flat 22 --> 18.  Patient is asymptomatic.  Sinus tachycardia: Heart rate noted to be elevated this morning.  Likely in the setting of pain and dehydration due to nausea and vomiting.  Continue with tube feedings.  Continue with the pain medications.  Continue to monitor heart rate.  Moderate malnutrition: Related to chronic illness.  Stage IV esophageal cancer. -Continue tube feedings. -Consulted dietitian-appreciate help with tube feedings.    DVT prophylaxis: Lovenox Code Status: Full code  family Communication: None present at bedside.  Plan of care discussed with patient in length and he verbalized understanding and agreed with it.  Disposition Plan: SNF versus home with home health  Consultants:  Oncology palliative care  Procedures:   None  Antimicrobials:  Cipro-DC on 11/11  Status is: Inpatient  Dispo: The patient is from: Home              Anticipated d/c is to: SNF versus home with home health              Anticipated d/c date is: More than 3 days              Patient currently not medically stable for the discharge   Subjective: Patient seen and examined.  Resting comfortably on  the bed.  Tells me that he continues to have nausea and vomiting, acid reflux symptoms and hip pain.  He still feels weak and has no energy to walk.  He denies chest pain, shortness of breath, abdominal pain, urinary or bowel  changes.  Objective: Vitals:   12/24/19 0110 12/24/19 0340 12/24/19 0700 12/24/19 0748  BP: 116/89 121/62 127/73 126/76  Pulse: (!) 114 (!) 109 (!) 114 (!) 109  Resp: 20 20 18 20   Temp: 97.7 F (36.5 C) 98 F (36.7 C) 97.9 F (36.6 C) 97.7 F (36.5 C)  TempSrc:    Oral  SpO2: 98% 93% 91% 94%  Weight:      Height:        Intake/Output Summary (Last 24 hours) at 12/24/2019 0956 Last data filed at 12/23/2019 1015 Gross per 24 hour  Intake --  Output 650 ml  Net -650 ml   Filed Weights   12/19/19 0139  Weight: 129.3 kg    Examination: General exam: Appears calm and comfortable, on room air, communicating well, appears weak and lethargic. Respiratory system: Clear to auscultation. Respiratory effort normal. Cardiovascular system: Tachycardic, S1 & S2 heard, RRR. No JVD, murmurs, rubs, gallops or clicks. No pedal edema. Gastrointestinal system: Abdomen is nondistended, soft and nontender. No organomegaly or masses felt. Normal bowel sounds heard.  PEG tube noted. Central nervous system: Alert and oriented. No focal neurological deficits. Extremities: Symmetric 5 x 5 power. Skin: No rashes, lesions or ulcers. Psychiatry: Judgement and insight appear normal. Mood & affect appropriate.   Data Reviewed: I have personally reviewed following labs and imaging studies  CBC: Recent Labs  Lab 12/19/19 0150 12/19/19 0445 12/19/19 0453 12/20/19 0454 12/21/19 0622 12/22/19 0527 12/23/19 0627  WBC 1.0*   < > 1.2* 1.5* 2.5* 5.0 6.3  NEUTROABS 0.1*  --  0.2*  --   --   --   --   HGB 11.4*   < > 10.7* 10.0* 9.8* 10.1* 10.2*  HCT 34.3*   < > 33.6* 30.4* 29.8* 30.7* 31.0*  MCV 93.5   < > 95.2 93.0 93.1 92.2 92.0  PLT 130*   < > 127* 135* 175 197 201   < > = values in this interval not displayed.   Basic Metabolic Panel: Recent Labs  Lab 12/20/19 0454 12/21/19 0622 12/22/19 0527 12/23/19 0627 12/24/19 0459  NA 134* 133* 130* 129* 125*  K 4.0 3.7 4.1 4.0 4.2  CL 98 97*  93* 91* 88*  CO2 29 27 31 31 31   GLUCOSE 127* 155* 131* 136* 136*  BUN 24* 25* 21* 20 20  CREATININE 1.38* 1.20 1.06 1.10 1.05  CALCIUM 9.7 9.8 9.8 9.9 9.9  MG 1.9  --   --   --   --   PHOS 2.6  --   --   --   --    GFR: Estimated Creatinine Clearance: 112.7 mL/min (by C-G formula based on SCr of 1.05 mg/dL). Liver Function Tests: Recent Labs  Lab 12/19/19 0145 12/19/19 0445  AST 39 37  ALT 47* 44  ALKPHOS 84 78  BILITOT 1.1 1.1  PROT 6.4* 6.0*  ALBUMIN 2.7* 2.5*   No results for input(s): LIPASE, AMYLASE in the last 168 hours. No results for input(s): AMMONIA in the last 168 hours. Coagulation Profile: Recent Labs  Lab 12/19/19 0445  INR 1.3*   Cardiac Enzymes: No results for input(s): CKTOTAL, CKMB, CKMBINDEX, TROPONINI in the last 168 hours. BNP (last 3 results)  No results for input(s): PROBNP in the last 8760 hours. HbA1C: No results for input(s): HGBA1C in the last 72 hours. CBG: Recent Labs  Lab 12/22/19 0914 12/22/19 1219 12/22/19 1611  GLUCAP 145* 146* 104*   Lipid Profile: No results for input(s): CHOL, HDL, LDLCALC, TRIG, CHOLHDL, LDLDIRECT in the last 72 hours. Thyroid Function Tests: No results for input(s): TSH, T4TOTAL, FREET4, T3FREE, THYROIDAB in the last 72 hours. Anemia Panel: No results for input(s): VITAMINB12, FOLATE, FERRITIN, TIBC, IRON, RETICCTPCT in the last 72 hours. Sepsis Labs: No results for input(s): PROCALCITON, LATICACIDVEN in the last 168 hours.  Recent Results (from the past 240 hour(s))  Respiratory Panel by RT PCR (Flu A&B, Covid) - Nasopharyngeal Swab     Status: None   Collection Time: 12/19/19  1:50 AM   Specimen: Nasopharyngeal Swab  Result Value Ref Range Status   SARS Coronavirus 2 by RT PCR NEGATIVE NEGATIVE Final    Comment: (NOTE) SARS-CoV-2 target nucleic acids are NOT DETECTED.  The SARS-CoV-2 RNA is generally detectable in upper respiratoy specimens during the acute phase of infection. The  lowest concentration of SARS-CoV-2 viral copies this assay can detect is 131 copies/mL. A negative result does not preclude SARS-Cov-2 infection and should not be used as the sole basis for treatment or other patient management decisions. A negative result may occur with  improper specimen collection/handling, submission of specimen other than nasopharyngeal swab, presence of viral mutation(s) within the areas targeted by this assay, and inadequate number of viral copies (<131 copies/mL). A negative result must be combined with clinical observations, patient history, and epidemiological information. The expected result is Negative.  Fact Sheet for Patients:  PinkCheek.be  Fact Sheet for Healthcare Providers:  GravelBags.it  This test is no t yet approved or cleared by the [place] FDA and  has been authorized for detection and/or diagnosis of SARS-CoV-2 by FDA under an Emergency Use Authorization (EUA). This EUA will remain  in effect (meaning this test can be used) for the duration of the COVID-19 declaration under Section 564(b)(1) of the Act, 21 U.S.C. section 360bbb-3(b)(1), unless the authorization is terminated or revoked sooner.     Influenza A by PCR NEGATIVE NEGATIVE Final   Influenza B by PCR NEGATIVE NEGATIVE Final    Comment: (NOTE) The Xpert Xpress SARS-CoV-2/FLU/RSV assay is intended as an aid in  the diagnosis of influenza from Nasopharyngeal swab specimens and  should not be used as a sole basis for treatment. Nasal washings and  aspirates are unacceptable for Xpert Xpress SARS-CoV-2/FLU/RSV  testing.  Fact Sheet for Patients: PinkCheek.be  Fact Sheet for Healthcare Providers: GravelBags.it  This test is not yet approved or cleared by the [place] FDA and  has been authorized for detection and/or diagnosis of SARS-CoV-2 by  FDA under  an Emergency Use Authorization (EUA). This EUA will remain  in effect (meaning this test can be used) for the duration of the  Covid-19 declaration under Section 564(b)(1) of the Act, 21  U.S.C. section 360bbb-3(b)(1), unless the authorization is  terminated or revoked. Performed at [facility], [address] [place], [place] [address]       Radiology Studies: DG HIP UNILAT WITH PELVIS 2-3 VIEWS LEFT  Result Date: 12/23/2019 CLINICAL DATA:  Bilateral hip pain, esophageal cancer with osseous metastatic disease EXAM: DG HIP (WITH OR WITHOUT PELVIS) 2-3V LEFT; DG HIP (WITH OR WITHOUT PELVIS) 2-3V RIGHT COMPARISON:  PET-CT, 11/23/2019 FINDINGS: No fracture or dislocation of the bilateral  hips. There is a large, lytic lesion of the left ilium, better characterized by prior PET-CT. There are no lesions of the bilateral proximal femurs appreciated radiographically. Mild superior joint space height loss and acetabular osteophytosis of the bilateral hips. Enthesopathic change about the iliac crests. IMPRESSION: 1. No fracture or dislocation of the bilateral hips. Mild arthrosis. 2. There is a large, lytic lesion of the left ilium, better characterized by prior PET-CT. There are no lesions of the bilateral proximal femurs appreciated radiographically. Contrast enhanced MRI is the test of choice to assess for osseous metastatic lesions. Electronically Signed   By: Eddie Candle M.D.   On: 12/23/2019 18:57   DG HIP UNILAT WITH PELVIS 2-3 VIEWS RIGHT  Result Date: 12/23/2019 CLINICAL DATA:  Bilateral hip pain, esophageal cancer with osseous metastatic disease EXAM: DG HIP (WITH OR WITHOUT PELVIS) 2-3V LEFT; DG HIP (WITH OR WITHOUT PELVIS) 2-3V RIGHT COMPARISON:  PET-CT, 11/23/2019 FINDINGS: No fracture or dislocation of the bilateral hips. There is a large, lytic lesion of the left ilium, better characterized by prior PET-CT. There are no lesions of the bilateral proximal femurs appreciated  radiographically. Mild superior joint space height loss and acetabular osteophytosis of the bilateral hips. Enthesopathic change about the iliac crests. IMPRESSION: 1. No fracture or dislocation of the bilateral hips. Mild arthrosis. 2. There is a large, lytic lesion of the left ilium, better characterized by prior PET-CT. There are no lesions of the bilateral proximal femurs appreciated radiographically. Contrast enhanced MRI is the test of choice to assess for osseous metastatic lesions. Electronically Signed   By: Eddie Candle M.D.   On: 12/23/2019 18:57    Scheduled Meds: . chlorhexidine  15 mL Mouth Rinse BID  . Chlorhexidine Gluconate Cloth  6 each Topical Daily  . enoxaparin (LOVENOX) injection  65 mg Subcutaneous Q24H  . feeding supplement (PROSource TF)  45 mL Per Tube BID  . fentaNYL  1 patch Transdermal Q72H  . free water  200 mL Per Tube Q4H  . influenza vac split quadrivalent PF  0.5 mL Intramuscular Tomorrow-1000  . lidocaine-prilocaine  1 application Topical Once  . mouth rinse  15 mL Mouth Rinse q12n4p  . metoCLOPramide  5 mg Oral TID AC  . OLANZapine  10 mg Oral QHS  . pantoprazole  40 mg Oral BID  . sennosides  10 mL Oral QHS  . sucralfate  1 g Oral TID WC & HS   Continuous Infusions: . feeding supplement (OSMOLITE 1.5 CAL) 1,000 mL (12/24/19 0937)     LOS: 5 days   Time spent: 35 minutes   Ocean Schildt Loann Quill, MD [facility]  If 7PM-7AM, please contact night-coverage [email or web address] 12/24/2019, 9:56 AM

## 2019-12-24 NOTE — TOC Progression Note (Addendum)
Transition of Care [facility]) - Progression Note    Patient Details  Name: Jacob Henson MRN: [id number] Date of Birth: July 25, 1960  [facility]) CM/SW [place], LCSW Phone Number: 12/24/2019, 9:57 AM  Clinical Narrative:   No other bed offers at this time. Spoke to patient's wife. She is agreeable to [facility] [place]. CSW updated Gerald Stabs with [facility] who will start insurance authorization.   2:30- Call from [place]. She is working on authorization and asked about patient's insurance card. Found card in chart. Tammy reported she can access this in patient's chart and will go print it off now.    Expected Discharge Plan: [place] Barriers to Discharge: Continued Medical Work up  Expected Discharge Plan and Services Expected Discharge Plan: [place] arrangements for the past 2 months: Single Family Home                                       Social Determinants of Health (SDOH) Interventions    Readmission Risk Interventions No flowsheet data found.

## 2019-12-25 DIAGNOSIS — R531 Weakness: Secondary | ICD-10-CM | POA: Diagnosis not present

## 2019-12-25 DIAGNOSIS — C158 Malignant neoplasm of overlapping sites of esophagus: Secondary | ICD-10-CM | POA: Diagnosis not present

## 2019-12-25 DIAGNOSIS — E440 Moderate protein-calorie malnutrition: Secondary | ICD-10-CM | POA: Diagnosis not present

## 2019-12-25 DIAGNOSIS — R112 Nausea with vomiting, unspecified: Secondary | ICD-10-CM | POA: Diagnosis not present

## 2019-12-25 LAB — BASIC METABOLIC PANEL
Anion gap: 11 (ref 5–15)
BUN: 21 mg/dL — ABNORMAL HIGH (ref 6–20)
CO2: 29 mmol/L (ref 22–32)
Calcium: 10.1 mg/dL (ref 8.9–10.3)
Chloride: 89 mmol/L — ABNORMAL LOW (ref 98–111)
Creatinine, Ser: 1.02 mg/dL (ref 0.61–1.24)
GFR, Estimated: >60 mL/min (ref >60)
Glucose, Bld: 106 mg/dL — ABNORMAL HIGH (ref 70–99)
Potassium: 4.4 mmol/L (ref 3.5–5.1)
Sodium: 129 mmol/L — ABNORMAL LOW (ref 135–145)

## 2019-12-25 LAB — CBC
HCT: 32.6 % — ABNORMAL LOW (ref 39.0–52.0)
Hemoglobin: 10.7 g/dL — ABNORMAL LOW (ref 13.0–17.0)
MCH: 29.9 pg (ref 26.0–34.0)
MCHC: 32.8 g/dL (ref 30.0–36.0)
MCV: 91.1 fL (ref 80.0–100.0)
Platelets: 205 10*3/uL (ref 150–400)
RBC: 3.58 MIL/uL — ABNORMAL LOW (ref 4.22–5.81)
RDW: 15.2 % (ref 11.5–15.5)
WBC: 10.6 10*3/uL — ABNORMAL HIGH (ref 4.0–10.5)
nRBC: 0.2 % (ref 0.0–0.2)

## 2019-12-25 MED ORDER — ENOXAPARIN SODIUM 60 MG/0.6ML ~~LOC~~ SOLN
0.5000 mg/kg | SUBCUTANEOUS | Status: DC
  Administered 2019-12-26 – 2019-12-27 (×2): 60 mg via SUBCUTANEOUS
  Filled 2019-12-25 (×2): qty 0.6

## 2019-12-25 MED ORDER — SODIUM CHLORIDE 0.9 % IV SOLN: INTRAVENOUS | Status: DC

## 2019-12-25 NOTE — Progress Notes (Signed)
PHARMACIST - PHYSICIAN COMMUNICATION  CONCERNING:  Enoxaparin (Lovenox) for DVT Prophylaxis    RECOMMENDATION: Patient was prescribed enoxaprin 65 mg q24 hours for VTE prophylaxis.   Filed Weights   12/19/19 0139 12/24/19 1548  Weight: 129.3 kg (285 lb) 119.2 kg (262 lb 12.6 oz)    Body mass index is 31.16 kg/m.  Estimated Creatinine Clearance: 111.5 mL/min (by C-G formula based on SCr of 1.02 mg/dL).   Based on [place] patient is candidate for enoxaparin 0.5mg /kg TBW SQ every 24 hours based on BMI being >30.   DESCRIPTION:  Patient is now receiving enoxaparin 60 mg every 24 hours -- dose changed due to change in weight and syringe size.    Rocky Morel, PharmD Clinical Pharmacist  12/25/2019 11:24 AM

## 2019-12-25 NOTE — Progress Notes (Signed)
   12/24/19 1929  Assess: MEWS Score  Temp 97.9 F (36.6 C)  BP 128/84  Pulse Rate (!) 111  Resp 18  SpO2 95 %  O2 Device Room Air  Assess: MEWS Score  MEWS Temp 0  MEWS Systolic 0  MEWS Pulse 2  MEWS RR 0  MEWS LOC 0  MEWS Score 2  MEWS Score Color Yellow  Assess: if the MEWS score is Yellow or Red  Were vital signs taken at a resting state? Yes  Focused Assessment No change from prior assessment  Early Detection of Sepsis Score *See Row Information* Low  MEWS guidelines implemented *See Row Information* No, vital signs rechecked  Take Vital Signs  Increase Vital Sign Frequency  Yellow: Q 2hr X 2 then Q 4hr X 2, if remains yellow, continue Q 4hrs  Escalate  MEWS: Escalate Yellow: discuss with charge nurse/RN and consider discussing with provider and RRT  Notify: Charge Nurse/RN  Name of Charge Nurse/RN Notified Jackie, RN  Date Charge Nurse/RN Notified 12/24/19  Time Charge Nurse/RN Notified 1930

## 2019-12-25 NOTE — Progress Notes (Signed)
PROGRESS NOTE    Jacob Henson  FYB:[id number] DOB: 1960/08/11 DOA: 12/19/2019 PCP: Elby Beck, FNP   Brief Narrative:  Jacob Henson is a 59 y.o.Caucasian malewith a known history of stage IVesophageal cancermetastatic to the bones, lungs and liveronactivechemotherapy and radiotherapy with last session of chemotherapy 1 week ago, and radiotherapy 2 weeks ago,GERD and hypertension,who presented to the emergency room with acute onset of progressive weakness over the last 3 days.   Assessment & Plan:  Generalized weakness likelysecondary to chemotherapyas well as mild acute kidney injury and dehydration: -AKI resolved. -Start patient on gentle hydration considering dehydration and tachycardia. -PT OT eval, recommended SNF placement.  TOC consulted-working on patient's placement SNF versus home with home health -continue to monitor vitals closely.  Nausea and vomiting:  -Symptoms improved with decrease tube feeding rate and increased dose of Reglan. -His last chemo was more than 2 weeks ago.  He is getting radiation therapy every day.  MRI brain showed no brain mets. -Appreciate dietitian help. -Continue Zofran, Reglan, olanzapine  Hyponatremia likely hypovolemic due to volume depletion and anorexia -Secondary to nausea and vomiting -Sodium improved from 125-129 this morning with decreased rate of free water flushes. -Patient remains asymptomatic -Appreciate dietician help.  Metastatic stage IV esophageal canceron activeradiotherapy andchemotherapy -Oncology following-appreciate help -Tube feeding management per dietitian -Continue chronic pain medications-oxycodone, morphine and fentanyl.  Pancytopenia -H&H, WBC and platelet: Stable.  Likely secondary to chemotherapy.  Continue to monitor CBC and fevers, bleeding.  Patient started empirically on Cipro due to leukopenia.  As per oncology note: Continue Cipro until [place] more than 500.  WBC: Improving.   DC Cipro on 11/11-appreciate pharmacy help  Hip pain: Reviewed x-ray from yesterday which shows no fracture or dislocation of bilateral hips.  Mild arthrosis.  There is a large lytic lesion of the left ilium -Continue with the pain medications.   -Continue PT  CKD stage IIIa -Remains stable  Elevated troponin secondary to demand ischemia -Trend has been flat 22 --> 18.  Patient is asymptomatic.  Sinus tachycardia: Heart rate noted to be elevated.   -Likely in the setting of pain and dehydration due to nausea and vomiting.  Continue with tube feedings.  Continue with the pain medications.  Continue to monitor heart rate. -Start on gentle hydration.  If continues to be elevated will add low-dose of metoprolol.  Moderate malnutrition: Related to chronic illness.  Stage IV esophageal cancer. -Continue tube feedings. -Consulted dietitian-appreciate help with tube feedings.    DVT prophylaxis: Lovenox Code Status: Full code  family Communication: None present at bedside.  Plan of care discussed with patient in length and he verbalized understanding and agreed with it.  Disposition Plan: SNF versus home with home health  Consultants:  Oncology palliative care  Procedures:   None  Antimicrobials:  Cipro-DC on 11/11  Status is: Inpatient  Dispo: The patient is from: Home              Anticipated d/c is to: SNF versus home with home health              Anticipated d/c date is: More than 3 days              Patient currently not medically stable for the discharge   Subjective: Patient seen and examined.  Tells me that he has nausea, vomiting and acid reflux symptoms has improved since yesterday.  He continues to have pain in his hip, generalized weakness and feels  like he is dehydrated.  He denies chest pain, shortness of breath, palpitation or leg swelling.  Objective: Vitals:   12/24/19 2125 12/24/19 2325 12/25/19 0433 12/25/19 0740  BP: 103/74 136/78 (!) 111/95 116/75   Pulse: (!) 113 (!) 114 (!) 116 (!) 113  Resp: 20 20 18 16   Temp: 97.6 F (36.4 C) 97.8 F (36.6 C) 98.1 F (36.7 C) 98.1 F (36.7 C)  TempSrc: Oral Oral Oral Axillary  SpO2: 93% 97% 95% 97%  Weight:      Height:        Intake/Output Summary (Last 24 hours) at 12/25/2019 0931 Last data filed at 12/25/2019 0039 Gross per 24 hour  Intake 1015.5 ml  Output 1725 ml  Net -709.5 ml   Filed Weights   12/19/19 0139 12/24/19 1548  Weight: 129.3 kg 119.2 kg    Examination: General exam: Appears calm and comfortable, on room air, communicating well, appears dehydrated and weak. Respiratory system: Clear to auscultation. Respiratory effort normal. Cardiovascular system: Tachycardic, S1 & S2 heard, RRR. No JVD, murmurs, rubs, gallops or clicks. No pedal edema. Gastrointestinal system: Abdomen is nondistended, soft and nontender. No organomegaly or masses felt. Normal bowel sounds heard.  PEG tube noted. Central nervous system: Alert and oriented. No focal neurological deficits. Skin: No rashes, lesions or ulcers. Psychiatry: Judgement and insight appear normal. Mood & affect appropriate.   Data Reviewed: I have personally reviewed following labs and imaging studies  CBC: Recent Labs  Lab 12/19/19 0150 12/19/19 0445 12/19/19 0453 12/19/19 0453 12/20/19 0454 12/21/19 0622 12/22/19 0527 12/23/19 0627 12/25/19 0507  WBC 1.0*   < > 1.2*   < > 1.5* 2.5* 5.0 6.3 10.6*  NEUTROABS 0.1*  --  0.2*  --   --   --   --   --   --   HGB 11.4*   < > 10.7*   < > 10.0* 9.8* 10.1* 10.2* 10.7*  HCT 34.3*   < > 33.6*   < > 30.4* 29.8* 30.7* 31.0* 32.6*  MCV 93.5   < > 95.2   < > 93.0 93.1 92.2 92.0 91.1  PLT 130*   < > 127*   < > 135* 175 197 201 205   < > = values in this interval not displayed.   Basic Metabolic Panel: Recent Labs  Lab 12/20/19 0454 12/20/19 0454 12/21/19 0622 12/22/19 0527 12/23/19 0627 12/24/19 0459 12/25/19 0507  NA 134*   < > 133* 130* 129* 125* 129*  K 4.0    < > 3.7 4.1 4.0 4.2 4.4  CL 98   < > 97* 93* 91* 88* 89*  CO2 29   < > 27 31 31 31 29   GLUCOSE 127*   < > 155* 131* 136* 136* 106*  BUN 24*   < > 25* 21* 20 20 21*  CREATININE 1.38*   < > 1.20 1.06 1.10 1.05 1.02  CALCIUM 9.7   < > 9.8 9.8 9.9 9.9 10.1  MG 1.9  --   --   --   --   --   --   PHOS 2.6  --   --   --   --   --   --    < > = values in this interval not displayed.   GFR: Estimated Creatinine Clearance: 111.5 mL/min (by C-G formula based on SCr of 1.02 mg/dL). Liver Function Tests: Recent Labs  Lab 12/19/19 0145 12/19/19 0445  AST 39 37  ALT 47* 44  ALKPHOS 84 78  BILITOT 1.1 1.1  PROT 6.4* 6.0*  ALBUMIN 2.7* 2.5*   No results for input(s): LIPASE, AMYLASE in the last 168 hours. No results for input(s): AMMONIA in the last 168 hours. Coagulation Profile: Recent Labs  Lab 12/19/19 0445  INR 1.3*   Cardiac Enzymes: No results for input(s): CKTOTAL, CKMB, CKMBINDEX, TROPONINI in the last 168 hours. BNP (last 3 results) No results for input(s): PROBNP in the last 8760 hours. HbA1C: No results for input(s): HGBA1C in the last 72 hours. CBG: Recent Labs  Lab 12/22/19 0914 12/22/19 1219 12/22/19 1611  GLUCAP 145* 146* 104*   Lipid Profile: No results for input(s): CHOL, HDL, LDLCALC, TRIG, CHOLHDL, LDLDIRECT in the last 72 hours. Thyroid Function Tests: No results for input(s): TSH, T4TOTAL, FREET4, T3FREE, THYROIDAB in the last 72 hours. Anemia Panel: No results for input(s): VITAMINB12, FOLATE, FERRITIN, TIBC, IRON, RETICCTPCT in the last 72 hours. Sepsis Labs: No results for input(s): PROCALCITON, LATICACIDVEN in the last 168 hours.  Recent Results (from the past 240 hour(s))  Respiratory Panel by RT PCR (Flu A&B, Covid) - Nasopharyngeal Swab     Status: None   Collection Time: 12/19/19  1:50 AM   Specimen: Nasopharyngeal Swab  Result Value Ref Range Status   SARS Coronavirus 2 by RT PCR NEGATIVE NEGATIVE Final    Comment: (NOTE) SARS-CoV-2 target  nucleic acids are NOT DETECTED.  The SARS-CoV-2 RNA is generally detectable in upper respiratoy specimens during the acute phase of infection. The lowest concentration of SARS-CoV-2 viral copies this assay can detect is 131 copies/mL. A negative result does not preclude SARS-Cov-2 infection and should not be used as the sole basis for treatment or other patient management decisions. A negative result may occur with  improper specimen collection/handling, submission of specimen other than nasopharyngeal swab, presence of viral mutation(s) within the areas targeted by this assay, and inadequate number of viral copies (<131 copies/mL). A negative result must be combined with clinical observations, patient history, and epidemiological information. The expected result is Negative.  Fact Sheet for Patients:  PinkCheek.be  Fact Sheet for Healthcare Providers:  GravelBags.it  This test is no t yet approved or cleared by the [place] FDA and  has been authorized for detection and/or diagnosis of SARS-CoV-2 by FDA under an Emergency Use Authorization (EUA). This EUA will remain  in effect (meaning this test can be used) for the duration of the COVID-19 declaration under Section 564(b)(1) of the Act, 21 U.S.C. section 360bbb-3(b)(1), unless the authorization is terminated or revoked sooner.     Influenza A by PCR NEGATIVE NEGATIVE Final   Influenza B by PCR NEGATIVE NEGATIVE Final    Comment: (NOTE) The Xpert Xpress SARS-CoV-2/FLU/RSV assay is intended as an aid in  the diagnosis of influenza from Nasopharyngeal swab specimens and  should not be used as a sole basis for treatment. Nasal washings and  aspirates are unacceptable for Xpert Xpress SARS-CoV-2/FLU/RSV  testing.  Fact Sheet for Patients: PinkCheek.be  Fact Sheet for Healthcare  Providers: GravelBags.it  This test is not yet approved or cleared by the [place] FDA and  has been authorized for detection and/or diagnosis of SARS-CoV-2 by  FDA under an Emergency Use Authorization (EUA). This EUA will remain  in effect (meaning this test can be used) for the duration of the  Covid-19 declaration under Section 564(b)(1) of the Act, 21  U.S.C. section 360bbb-3(b)(1), unless the authorization is  terminated or  revoked. Performed at [facility], [address] [place], [place] [address]       Radiology Studies: DG HIP UNILAT WITH PELVIS 2-3 VIEWS LEFT  Result Date: 12/23/2019 CLINICAL DATA:  Bilateral hip pain, esophageal cancer with osseous metastatic disease EXAM: DG HIP (WITH OR WITHOUT PELVIS) 2-3V LEFT; DG HIP (WITH OR WITHOUT PELVIS) 2-3V RIGHT COMPARISON:  PET-CT, 11/23/2019 FINDINGS: No fracture or dislocation of the bilateral hips. There is a large, lytic lesion of the left ilium, better characterized by prior PET-CT. There are no lesions of the bilateral proximal femurs appreciated radiographically. Mild superior joint space height loss and acetabular osteophytosis of the bilateral hips. Enthesopathic change about the iliac crests. IMPRESSION: 1. No fracture or dislocation of the bilateral hips. Mild arthrosis. 2. There is a large, lytic lesion of the left ilium, better characterized by prior PET-CT. There are no lesions of the bilateral proximal femurs appreciated radiographically. Contrast enhanced MRI is the test of choice to assess for osseous metastatic lesions. Electronically Signed   By: Eddie Candle M.D.   On: 12/23/2019 18:57   DG HIP UNILAT WITH PELVIS 2-3 VIEWS RIGHT  Result Date: 12/23/2019 CLINICAL DATA:  Bilateral hip pain, esophageal cancer with osseous metastatic disease EXAM: DG HIP (WITH OR WITHOUT PELVIS) 2-3V LEFT; DG HIP (WITH OR WITHOUT PELVIS) 2-3V RIGHT COMPARISON:  PET-CT, 11/23/2019 FINDINGS:  No fracture or dislocation of the bilateral hips. There is a large, lytic lesion of the left ilium, better characterized by prior PET-CT. There are no lesions of the bilateral proximal femurs appreciated radiographically. Mild superior joint space height loss and acetabular osteophytosis of the bilateral hips. Enthesopathic change about the iliac crests. IMPRESSION: 1. No fracture or dislocation of the bilateral hips. Mild arthrosis. 2. There is a large, lytic lesion of the left ilium, better characterized by prior PET-CT. There are no lesions of the bilateral proximal femurs appreciated radiographically. Contrast enhanced MRI is the test of choice to assess for osseous metastatic lesions. Electronically Signed   By: Eddie Candle M.D.   On: 12/23/2019 18:57    Scheduled Meds: . chlorhexidine  15 mL Mouth Rinse BID  . Chlorhexidine Gluconate Cloth  6 each Topical Daily  . enoxaparin (LOVENOX) injection  65 mg Subcutaneous Q24H  . feeding supplement (PROSource TF)  45 mL Per Tube QID  . fentaNYL  1 patch Transdermal Q72H  . free water  120 mL Per Tube Q4H  . influenza vac split quadrivalent PF  0.5 mL Intramuscular Tomorrow-1000  . lidocaine-prilocaine  1 application Topical Once  . mouth rinse  15 mL Mouth Rinse q12n4p  . metoCLOPramide  10 mg Oral TID AC  . OLANZapine  10 mg Oral QHS  . pantoprazole  40 mg Oral BID  . sennosides  10 mL Oral QHS  . sucralfate  1 g Oral TID WC & HS   Continuous Infusions: . sodium chloride    . chlorproMAZINE (THORAZINE) IV    . feeding supplement (OSMOLITE 1.5 CAL) 1,000 mL (12/25/19 0039)     LOS: 6 days   Time spent: 35 minutes   Treshon Stannard Loann Quill, MD [facility]  If 7PM-7AM, please contact night-coverage [email or web address] 12/25/2019, 9:31 AM

## 2019-12-25 NOTE — Progress Notes (Signed)
Patient is resting in bed with wife at bedside. He has not complained of pain today. On hourly rounding, he has said pain has been around 1-3 and is refusing pain medication at this time. Consulted with doctor this morning about patient tachycardic episodes and she stated that she was going to give IV fluids first along with treating his hip pain with PRN meds and determine if that would decrease heart rate. She stated that she talked with patient and that if medication is needed to control heart rate they would consider it later. Osmolite 1.5 infusing through peg tube with 120cc flushes every four hours. Bed in low position and call bell in reach. NS @ 75 infusing through left AC.

## 2019-12-26 ENCOUNTER — Encounter: Payer: Self-pay | Admitting: Family Medicine

## 2019-12-26 DIAGNOSIS — Z515 Encounter for palliative care: Secondary | ICD-10-CM | POA: Diagnosis not present

## 2019-12-26 DIAGNOSIS — R112 Nausea with vomiting, unspecified: Secondary | ICD-10-CM | POA: Diagnosis not present

## 2019-12-26 DIAGNOSIS — N179 Acute kidney failure, unspecified: Secondary | ICD-10-CM | POA: Diagnosis not present

## 2019-12-26 DIAGNOSIS — D696 Thrombocytopenia, unspecified: Secondary | ICD-10-CM | POA: Diagnosis not present

## 2019-12-26 LAB — BASIC METABOLIC PANEL
Anion gap: 8 (ref 5–15)
BUN: 21 mg/dL — ABNORMAL HIGH (ref 6–20)
CO2: 32 mmol/L (ref 22–32)
Calcium: 10.5 mg/dL — ABNORMAL HIGH (ref 8.9–10.3)
Chloride: 90 mmol/L — ABNORMAL LOW (ref 98–111)
Creatinine, Ser: 0.96 mg/dL (ref 0.61–1.24)
GFR, Estimated: >60 mL/min (ref >60)
Glucose, Bld: 126 mg/dL — ABNORMAL HIGH (ref 70–99)
Potassium: 4.4 mmol/L (ref 3.5–5.1)
Sodium: 130 mmol/L — ABNORMAL LOW (ref 135–145)

## 2019-12-26 MED ORDER — SODIUM CHLORIDE 0.9% FLUSH
10.0000 mL | Freq: Two times a day (BID) | INTRAVENOUS | Status: DC
  Administered 2019-12-26 – 2019-12-27 (×3): 10 mL

## 2019-12-26 MED ORDER — SODIUM CHLORIDE 0.9% FLUSH: 10.0000 mL | INTRAVENOUS | Status: DC | PRN

## 2019-12-26 MED ORDER — METOPROLOL TARTRATE 25 MG PO TABS
12.5000 mg | ORAL_TABLET | Freq: Every day | ORAL | Status: DC
  Administered 2019-12-26 – 2019-12-27 (×2): 12.5 mg via ORAL
  Filled 2019-12-26 (×2): qty 1

## 2019-12-26 NOTE — Progress Notes (Signed)
PROGRESS NOTE    Jacob Henson  KKX:[id number] DOB: 04-27-1960 DOA: 12/19/2019 PCP: Elby Beck, FNP   Brief Narrative:  Jacob Henson is a 59 y.o.Caucasian malewith a known history of stage IVesophageal cancermetastatic to the bones, lungs and liveronactivechemotherapy and radiotherapy with last session of chemotherapy 1 week ago, and radiotherapy 2 weeks ago,GERD and hypertension,who presented to the emergency room with acute onset of progressive weakness over the last 3 days.   Assessment & Plan:  Generalized weakness likelysecondary to chemotherapyas well as mild acute kidney injury and dehydration: -AKI resolved. -PT OT eval, recommended SNF placement.  TOC consulted-working on patient's placement SNF versus home with home health -continue to monitor vitals closely. Hold IVF.  Nausea and vomiting:  -Symptoms improved with decrease tube feeding rate and increased dose of Reglan. -His last chemo was more than 2-3 weeks ago.  He is getting radiation therapy every day.  MRI brain showed no brain mets. -Appreciate dietitian help. -Continue Zofran, Reglan, olanzapine  Hyponatremia likely hypovolemic due to volume depletion and anorexia -Secondary to nausea and vomiting -Sodium improved from 125-129-130 this morning with decreased rate of free water flushes. -Patient remains asymptomatic -Appreciate dietician help.  Metastatic stage IV esophageal canceron activeradiotherapy andchemotherapy -Oncology following-appreciate help -Tube feeding management per dietitian -Continue chronic pain medications-oxycodone, morphine and fentanyl.  Pancytopenia -H&H, WBC and platelet: Stable.  Likely secondary to chemotherapy.  Continue to monitor CBC and fevers, bleeding.  Patient started empirically on Cipro due to leukopenia.  As per oncology note: Continue Cipro until [place] more than 500.  WBC: Improving.  DC Cipro on 11/11-appreciate pharmacy help  Hip pain:  Reviewed x-ray from yesterday which shows no fracture or dislocation of bilateral hips.  Mild arthrosis.  There is a large lytic lesion of the left ilium -Continue with the pain medications.   -Continue PT  CKD stage IIIa -Remains stable  Elevated troponin secondary to demand ischemia -Trend has been flat 22 --> 18.  Patient is asymptomatic.  Sinus tachycardia:  -Likely in the setting of pain and dehydration due to nausea and vomiting.  Continue with tube feedings.  Continue with the pain medications.  Continue to monitor heart rate. -Stop IVF-pt has b/l leg swelling. -Add low dose metoprolol. Monitor BP closely  Moderate malnutrition: Related to chronic illness.  Stage IV esophageal cancer. -Continue tube feedings. -Consulted dietitian-appreciate help with tube feedings.    DVT prophylaxis: Lovenox Code Status: Full code  family Communication: None present at bedside.  Plan of care discussed with patient in length and he verbalized understanding and agreed with it.  I tried to call patient's wife to discuss plan of care with no response.  Disposition Plan: SNF versus home with home health-waiting for insurance approval. CM is working on it.  Consultants:  Oncology palliative care  Procedures:   None  Antimicrobials:  Cipro-DC on 11/11  Status is: Inpatient  Dispo: The patient is from: Home              Anticipated d/c is to: SNF versus home with home health              Anticipated d/c date is: More than 3 days              Patient currently not medically stable for the discharge   Subjective: Patient seen and examined.  Tells me that his nausea and acid reflux has improved significantly.  He is complaining of bilateral leg swelling, continues to  have weakness.  Denies chest pain, shortness of breath, orthopnea or PND.  Objective: Vitals:   12/25/19 2100 12/26/19 0100 12/26/19 0500 12/26/19 0858  BP: 121/87 136/87 133/84 124/81  Pulse: (!) 110 (!) 110 100 (!)  110  Resp: 20 20 18 20   Temp: 98.5 F (36.9 C) 98.2 F (36.8 C) 98.6 F (37 C) 98 F (36.7 C)  TempSrc: Oral Oral Oral Oral  SpO2: 94% 92% 97% 96%  Weight:      Height:        Intake/Output Summary (Last 24 hours) at 12/26/2019 0942 Last data filed at 12/26/2019 0500 Gross per 24 hour  Intake 520.46 ml  Output 3200 ml  Net -2679.54 ml   Filed Weights   12/19/19 0139 12/24/19 1548  Weight: 129.3 kg 119.2 kg    Examination:  General exam: Appears calm and comfortable, on room air, communicating well.  Respiratory system: Clear to auscultation. Respiratory effort normal. Cardiovascular system: S1 & S2 heard, RRR. No JVD, murmurs, rubs, gallops or clicks.  Bilateral 2+ pitting edema positive Gastrointestinal system: Abdomen is nondistended, soft and nontender. No organomegaly or masses felt. Normal bowel sounds heard.  PEG tube noted. Central nervous system: Alert and oriented. No focal neurological deficits. Skin: No rashes, lesions or ulcers. Psychiatry: Judgement and insight appear normal. Mood & affect appropriate..   Data Reviewed: I have personally reviewed following labs and imaging studies  CBC: Recent Labs  Lab 12/20/19 0454 12/21/19 0622 12/22/19 0527 12/23/19 0627 12/25/19 0507  WBC 1.5* 2.5* 5.0 6.3 10.6*  HGB 10.0* 9.8* 10.1* 10.2* 10.7*  HCT 30.4* 29.8* 30.7* 31.0* 32.6*  MCV 93.0 93.1 92.2 92.0 91.1  PLT 135* 175 197 201 938   Basic Metabolic Panel: Recent Labs  Lab 12/20/19 0454 12/21/19 0622 12/22/19 0527 12/23/19 0627 12/24/19 0459 12/25/19 0507 12/26/19 0650  NA 134*   < > 130* 129* 125* 129* 130*  K 4.0   < > 4.1 4.0 4.2 4.4 4.4  CL 98   < > 93* 91* 88* 89* 90*  CO2 29   < > 31 31 31 29  32  GLUCOSE 127*   < > 131* 136* 136* 106* 126*  BUN 24*   < > 21* 20 20 21* 21*  CREATININE 1.38*   < > 1.06 1.10 1.05 1.02 0.96  CALCIUM 9.7   < > 9.8 9.9 9.9 10.1 10.5*  MG 1.9  --   --   --   --   --   --   PHOS 2.6  --   --   --   --   --   --     < > = values in this interval not displayed.   GFR: Estimated Creatinine Clearance: 118.5 mL/min (by C-G formula based on SCr of 0.96 mg/dL). Liver Function Tests: No results for input(s): AST, ALT, ALKPHOS, BILITOT, PROT, ALBUMIN in the last 168 hours. No results for input(s): LIPASE, AMYLASE in the last 168 hours. No results for input(s): AMMONIA in the last 168 hours. Coagulation Profile: No results for input(s): INR, PROTIME in the last 168 hours. Cardiac Enzymes: No results for input(s): CKTOTAL, CKMB, CKMBINDEX, TROPONINI in the last 168 hours. BNP (last 3 results) No results for input(s): PROBNP in the last 8760 hours. HbA1C: No results for input(s): HGBA1C in the last 72 hours. CBG: Recent Labs  Lab 12/22/19 0914 12/22/19 1219 12/22/19 1611  GLUCAP 145* 146* 104*   Lipid Profile: No results for input(s):  CHOL, HDL, LDLCALC, TRIG, CHOLHDL, LDLDIRECT in the last 72 hours. Thyroid Function Tests: No results for input(s): TSH, T4TOTAL, FREET4, T3FREE, THYROIDAB in the last 72 hours. Anemia Panel: No results for input(s): VITAMINB12, FOLATE, FERRITIN, TIBC, IRON, RETICCTPCT in the last 72 hours. Sepsis Labs: No results for input(s): PROCALCITON, LATICACIDVEN in the last 168 hours.  Recent Results (from the past 240 hour(s))  Respiratory Panel by RT PCR (Flu A&B, Covid) - Nasopharyngeal Swab     Status: None   Collection Time: 12/19/19  1:50 AM   Specimen: Nasopharyngeal Swab  Result Value Ref Range Status   SARS Coronavirus 2 by RT PCR NEGATIVE NEGATIVE Final    Comment: (NOTE) SARS-CoV-2 target nucleic acids are NOT DETECTED.  The SARS-CoV-2 RNA is generally detectable in upper respiratoy specimens during the acute phase of infection. The lowest concentration of SARS-CoV-2 viral copies this assay can detect is 131 copies/mL. A negative result does not preclude SARS-Cov-2 infection and should not be used as the sole basis for treatment or other patient management  decisions. A negative result may occur with  improper specimen collection/handling, submission of specimen other than nasopharyngeal swab, presence of viral mutation(s) within the areas targeted by this assay, and inadequate number of viral copies (<131 copies/mL). A negative result must be combined with clinical observations, patient history, and epidemiological information. The expected result is Negative.  Fact Sheet for Patients:  PinkCheek.be  Fact Sheet for Healthcare Providers:  GravelBags.it  This test is no t yet approved or cleared by the [place] FDA and  has been authorized for detection and/or diagnosis of SARS-CoV-2 by FDA under an Emergency Use Authorization (EUA). This EUA will remain  in effect (meaning this test can be used) for the duration of the COVID-19 declaration under Section 564(b)(1) of the Act, 21 U.S.C. section 360bbb-3(b)(1), unless the authorization is terminated or revoked sooner.     Influenza A by PCR NEGATIVE NEGATIVE Final   Influenza B by PCR NEGATIVE NEGATIVE Final    Comment: (NOTE) The Xpert Xpress SARS-CoV-2/FLU/RSV assay is intended as an aid in  the diagnosis of influenza from Nasopharyngeal swab specimens and  should not be used as a sole basis for treatment. Nasal washings and  aspirates are unacceptable for Xpert Xpress SARS-CoV-2/FLU/RSV  testing.  Fact Sheet for Patients: PinkCheek.be  Fact Sheet for Healthcare Providers: GravelBags.it  This test is not yet approved or cleared by the [place] FDA and  has been authorized for detection and/or diagnosis of SARS-CoV-2 by  FDA under an Emergency Use Authorization (EUA). This EUA will remain  in effect (meaning this test can be used) for the duration of the  Covid-19 declaration under Section 564(b)(1) of the Act, 21  U.S.C. section 360bbb-3(b)(1), unless the  authorization is  terminated or revoked. Performed at [facility], [address], [place],  [address]       Radiology Studies: No results found.  Scheduled Meds: . chlorhexidine  15 mL Mouth Rinse BID  . Chlorhexidine Gluconate Cloth  6 each Topical Daily  . enoxaparin (LOVENOX) injection  0.5 mg/kg Subcutaneous Q24H  . feeding supplement (PROSource TF)  45 mL Per Tube QID  . fentaNYL  1 patch Transdermal Q72H  . free water  120 mL Per Tube Q4H  . influenza vac split quadrivalent PF  0.5 mL Intramuscular Tomorrow-1000  . lidocaine-prilocaine  1 application Topical Once  . mouth rinse  15 mL Mouth Rinse q12n4p  . metoCLOPramide  10 mg Oral TID AC  . OLANZapine  10 mg Oral QHS  . pantoprazole  40 mg Oral BID  . sennosides  10 mL Oral QHS  . sucralfate  1 g Oral TID WC & HS   Continuous Infusions: . sodium chloride 75 mL/hr at 12/26/19 0500  . chlorproMAZINE (THORAZINE) IV    . feeding supplement (OSMOLITE 1.5 CAL) 1,000 mL (12/25/19 0039)     LOS: 7 days   Time spent: 35 minutes   Lynnix Schoneman Loann Quill, MD [facility]  If 7PM-7AM, please contact night-coverage [email or web address] 12/26/2019, 9:42 AM

## 2019-12-26 NOTE — Plan of Care (Signed)
  Problem: Education: Goal: Knowledge of General Education information will improve Description Including pain rating scale, medication(s)/side effects and non-pharmacologic comfort measures Outcome: Progressing   

## 2019-12-27 ENCOUNTER — Ambulatory Visit: Payer: 59

## 2019-12-27 DIAGNOSIS — C158 Malignant neoplasm of overlapping sites of esophagus: Secondary | ICD-10-CM | POA: Diagnosis not present

## 2019-12-27 DIAGNOSIS — R531 Weakness: Secondary | ICD-10-CM | POA: Diagnosis not present

## 2019-12-27 DIAGNOSIS — R112 Nausea with vomiting, unspecified: Secondary | ICD-10-CM | POA: Diagnosis not present

## 2019-12-27 DIAGNOSIS — D701 Agranulocytosis secondary to cancer chemotherapy: Secondary | ICD-10-CM

## 2019-12-27 DIAGNOSIS — N179 Acute kidney failure, unspecified: Secondary | ICD-10-CM | POA: Diagnosis not present

## 2019-12-27 DIAGNOSIS — D649 Anemia, unspecified: Secondary | ICD-10-CM | POA: Diagnosis not present

## 2019-12-27 DIAGNOSIS — T451X5A Adverse effect of antineoplastic and immunosuppressive drugs, initial encounter: Secondary | ICD-10-CM

## 2019-12-27 LAB — BASIC METABOLIC PANEL
Anion gap: 9 (ref 5–15)
BUN: 23 mg/dL — ABNORMAL HIGH (ref 6–20)
CO2: 31 mmol/L (ref 22–32)
Calcium: 10.8 mg/dL — ABNORMAL HIGH (ref 8.9–10.3)
Chloride: 92 mmol/L — ABNORMAL LOW (ref 98–111)
Creatinine, Ser: 1.01 mg/dL (ref 0.61–1.24)
GFR, Estimated: >60 mL/min (ref >60)
Glucose, Bld: 122 mg/dL — ABNORMAL HIGH (ref 70–99)
Potassium: 4.7 mmol/L (ref 3.5–5.1)
Sodium: 132 mmol/L — ABNORMAL LOW (ref 135–145)

## 2019-12-27 LAB — CBC
HCT: 33.5 % — ABNORMAL LOW (ref 39.0–52.0)
Hemoglobin: 10.8 g/dL — ABNORMAL LOW (ref 13.0–17.0)
MCH: 29.8 pg (ref 26.0–34.0)
MCHC: 32.2 g/dL (ref 30.0–36.0)
MCV: 92.3 fL (ref 80.0–100.0)
Platelets: 194 10*3/uL (ref 150–400)
RBC: 3.63 MIL/uL — ABNORMAL LOW (ref 4.22–5.81)
RDW: 15.6 % — ABNORMAL HIGH (ref 11.5–15.5)
WBC: 10.4 10*3/uL (ref 4.0–10.5)
nRBC: 0.2 % (ref 0.0–0.2)

## 2019-12-27 LAB — ALBUMIN: Albumin: 2.2 g/dL — ABNORMAL LOW (ref 3.5–5.0)

## 2019-12-27 LAB — SARS CORONAVIRUS 2 BY RT PCR (HOSPITAL ORDER, PERFORMED IN ~~LOC~~ HOSPITAL LAB): SARS Coronavirus 2: NEGATIVE

## 2019-12-27 MED ORDER — CALCIUM CARBONATE ANTACID 500 MG PO CHEW: 1.0000 | CHEWABLE_TABLET | Freq: Two times a day (BID) | ORAL | 0 refills | Status: AC | PRN

## 2019-12-27 MED ORDER — TRAZODONE HCL 50 MG PO TABS: 25.0000 mg | ORAL_TABLET | Freq: Every evening | ORAL | 0 refills | Status: AC | PRN

## 2019-12-27 MED ORDER — ZOLEDRONIC ACID 4 MG/5ML IV CONC
4.0000 mg | Freq: Once | INTRAVENOUS | Status: AC
  Administered 2019-12-27: 4 mg via INTRAVENOUS
  Filled 2019-12-27: qty 5

## 2019-12-27 MED ORDER — PROSOURCE TF PO LIQD: 45.0000 mL | Freq: Four times a day (QID) | ORAL | 0 refills | Status: AC

## 2019-12-27 MED ORDER — ACETAMINOPHEN 325 MG PO TABS: 650.0000 mg | ORAL_TABLET | Freq: Four times a day (QID) | ORAL | Status: AC | PRN

## 2019-12-27 MED ORDER — FENTANYL 12 MCG/HR TD PT72: 1.0000 | MEDICATED_PATCH | TRANSDERMAL | 0 refills | Status: AC

## 2019-12-27 MED ORDER — METOCLOPRAMIDE HCL 5 MG/5ML PO SOLN: 10.0000 mg | Freq: Three times a day (TID) | ORAL | 0 refills | Status: AC

## 2019-12-27 MED ORDER — METOPROLOL TARTRATE 25 MG PO TABS: 12.5000 mg | ORAL_TABLET | Freq: Every day | ORAL | 0 refills | Status: AC

## 2019-12-27 MED ORDER — OXYCODONE HCL 5 MG/5ML PO SOLN: 5.0000 mg | ORAL | 0 refills | Status: AC | PRN

## 2019-12-27 MED ORDER — MAGNESIUM HYDROXIDE 400 MG/5ML PO SUSP: 30.0000 mL | Freq: Every day | ORAL | 0 refills | Status: AC | PRN

## 2019-12-27 NOTE — Progress Notes (Signed)
Hematology/Oncology Consult note [facility]  Telephone:([phone] [phone]  Patient Care Team: Elby Beck, FNP as PCP - General (Nurse Practitioner) Clent Jacks, RN as Oncology Nurse Navigator Sindy Guadeloupe, MD as Consulting Physician (Oncology)   Name of the patient: Jacob Henson  [id number]  07/05/1960   Date of visit: 12/27/2019   Interval history-feels significantly fatigued.  Is tolerating tube feeds at 70 mL an hour.  Presently denies any significant nausea or vomiting  ECOG PS- 2-3 Pain scale- 0 Opioid associated constipation- no  Review of systems- Review of Systems  Constitutional: Positive for malaise/fatigue. Negative for chills, fever and weight loss.  HENT: Negative for congestion, ear discharge and nosebleeds.   Eyes: Negative for blurred vision.  Respiratory: Negative for cough, hemoptysis, sputum production, shortness of breath and wheezing.   Cardiovascular: Negative for chest pain, palpitations, orthopnea and claudication.  Gastrointestinal: Negative for abdominal pain, blood in stool, constipation, diarrhea, heartburn, melena, nausea and vomiting.  Genitourinary: Negative for dysuria, flank pain, frequency, hematuria and urgency.  Musculoskeletal: Negative for back pain, joint pain and myalgias.  Skin: Negative for rash.  Neurological: Negative for dizziness, tingling, focal weakness, seizures, weakness and headaches.  Endo/Heme/Allergies: Does not bruise/bleed easily.  Psychiatric/Behavioral: Negative for depression and suicidal ideas. The patient does not have insomnia.        No Known Allergies   Past Medical History:  Diagnosis Date  . Allergy   . Cancer ([place])   . Esophageal cancer ([place])   . GERD (gastroesophageal reflux disease)    Has required esophageal dilation in past  . Hypertension      Past Surgical History:  Procedure Laterality Date  . ESOPHAGEAL DILATION    . PEG TUBE  PLACEMENT  11/30/2019  . PORTA CATH INSERTION N/A 11/19/2019   Procedure: PORTA CATH INSERTION;  Surgeon: Algernon Huxley, MD;  Location: [facility];  Service: Cardiovascular;  Laterality: N/A;    Social History   Socioeconomic History  . Marital status: Married    Spouse name: Not on file  . Number of children: Not on file  . Years of education: Not on file  . Highest education level: Not on file  Occupational History  . Occupation: full time  Tobacco Use  . Smoking status: Former Smoker    Packs/day: 1.00    Years: 4.00    Pack years: 4.00    Types: Cigarettes    Quit date: 10/14/1978    Years since quitting: 41.2  . Smokeless tobacco: Never Used  Vaping Use  . Vaping Use: Never used  Substance and Sexual Activity  . Alcohol use: No  . Drug use: Never  . Sexual activity: Not Currently  Other Topics Concern  . Not on file  Social History Narrative  . Not on file   Social Determinants of Health   Financial Resource Strain:   . Difficulty of Paying Living Expenses: Not on file  Food Insecurity:   . Worried About Charity fundraiser in the Last Year: Not on file  . Ran Out of Food in the Last Year: Not on file  Transportation Needs:   . Lack of Transportation (Medical): Not on file  . Lack of Transportation (Non-Medical): Not on file  Physical Activity:   . Days of Exercise per Week: Not on file  . Minutes of Exercise per Session: Not on file  Stress:   . Feeling of Stress : Not  on file  Social Connections:   . Frequency of Communication with Friends and Family: Not on file  . Frequency of Social Gatherings with Friends and Family: Not on file  . Attends Religious Services: Not on file  . Active Member of Clubs or Organizations: Not on file  . Attends Archivist Meetings: Not on file  . Marital Status: Not on file  Intimate Partner Violence:   . Fear of Current or Ex-Partner: Not on file  . Emotionally Abused: Not on file  . Physically Abused:  Not on file  . Sexually Abused: Not on file    Family History  Problem Relation Age of Onset  . Heart disease Father   . Hearing loss Father   . Diabetes Father   . Heart disease Paternal Grandmother   . Diabetes Paternal Grandmother   . Heart attack Paternal Grandfather      Current Facility-Administered Medications:  .  0.9 %  sodium chloride infusion, , Intravenous, Continuous, Pahwani, Rinka R, MD, Stopped at 12/26/19 1000 .  acetaminophen (TYLENOL) tablet 650 mg, 650 mg, Oral, Q6H PRN, 650 mg at 12/24/19 1303 **OR** acetaminophen (TYLENOL) suppository 650 mg, 650 mg, Rectal, Q6H PRN, Mansy, Jan A, MD .  calcium carbonate (TUMS - dosed in mg elemental calcium) chewable tablet 200 mg of elemental calcium, 1 tablet, Oral, BID PRN, Lang Snow, NP, 200 mg of elemental calcium at 12/24/19 0312 .  chlorhexidine (PERIDEX) 0.12 % solution 15 mL, 15 mL, Mouth Rinse, BID, Lorella Nimrod, MD, 15 mL at 12/27/19 0742 .  Chlorhexidine Gluconate Cloth 2 % PADS 6 each, 6 each, Topical, Daily, Dessa Phi, DO, 6 each at 12/27/19 0809 .  chlorproMAZINE (THORAZINE) 12.5 mg in sodium chloride 0.9 % 25 mL IVPB, 12.5 mg, Intravenous, Q6H PRN, [place], Bing Neighbors, NP .  enoxaparin (LOVENOX) injection 60 mg, 0.5 mg/kg, Subcutaneous, Q24H, Rocky Morel, RPH, 60 mg at 12/27/19 0742 .  feeding supplement (OSMOLITE 1.5 CAL) liquid 1,000 mL, 1,000 mL, Per Tube, Continuous, Pahwani, Rinka R, MD, Last Rate: 70 mL/hr at 12/25/19 0039, 1,000 mL at 12/25/19 0039 .  feeding supplement (PROSource TF) liquid 45 mL, 45 mL, Per Tube, QID, Pahwani, Rinka R, MD, 45 mL at 12/27/19 0800 .  fentaNYL (DURAGESIC) 12 MCG/HR 1 patch, 1 patch, Transdermal, Q72H, Mansy, Jan A, MD, 1 patch at 12/25/19 0451 .  free water 120 mL, 120 mL, Per Tube, Q4H, Pahwani, Rinka R, MD, 120 mL at 12/27/19 1250 .  influenza vac split quadrivalent PF (FLUARIX) injection 0.5 mL, 0.5 mL, Intramuscular, Tomorrow-1000, Belue, Alver Sorrow,  RPH .  lidocaine-prilocaine (EMLA) cream 1 application, 1 application, Topical, Once, Mansy, Jan A, MD .  magnesium hydroxide (MILK OF MAGNESIA) suspension 30 mL, 30 mL, Oral, Daily PRN, Mansy, Jan A, MD .  MEDLINE mouth rinse, 15 mL, Mouth Rinse, q12n4p, Lorella Nimrod, MD, 15 mL at 12/27/19 1250 .  metoCLOPramide (REGLAN) 5 MG/5ML solution 10 mg, 10 mg, Oral, TID AC, Sindy Guadeloupe, MD, 10 mg at 12/27/19 1250 .  metoprolol tartrate (LOPRESSOR) tablet 12.5 mg, 12.5 mg, Oral, Daily, Pahwani, Rinka R, MD, 12.5 mg at 12/27/19 0743 .  morphine 2 MG/ML injection 2 mg, 2 mg, Intravenous, Q4H PRN, Mansy, Jan A, MD, 2 mg at 12/24/19 2205 .  OLANZapine (ZYPREXA) tablet 10 mg, 10 mg, Oral, QHS, Mansy, Jan A, MD, 10 mg at 12/26/19 2024 .  ondansetron (ZOFRAN) tablet 4 mg, 4 mg, Oral, Q6H PRN, 4 mg  at 12/23/19 2231 **OR** ondansetron (ZOFRAN) injection 4 mg, 4 mg, Intravenous, Q6H PRN, Mansy, Jan A, MD, 4 mg at 12/24/19 0928 .  oxyCODONE (ROXICODONE) 5 MG/5ML solution 5 mg, 5 mg, Oral, Q4H PRN, Mansy, Jan A, MD, 5 mg at 12/27/19 0742 .  pantoprazole (PROTONIX) EC tablet 40 mg, 40 mg, Oral, BID, Mansy, Jan A, MD, 40 mg at 12/27/19 0743 .  sennosides (SENOKOT) 8.8 MG/5ML syrup 10 mL, 10 mL, Oral, QHS, Mansy, Jan A, MD, 10 mL at 12/26/19 2025 .  sodium chloride flush (NS) 0.9 % injection 10-40 mL, 10-40 mL, Intracatheter, Q12H, Pahwani, Rinka R, MD, 10 mL at 12/27/19 0800 .  sodium chloride flush (NS) 0.9 % injection 10-40 mL, 10-40 mL, Intracatheter, PRN, Pahwani, Rinka R, MD .  sucralfate (CARAFATE) 1 GM/10ML suspension 1 g, 1 g, Oral, TID WC & HS, Pahwani, Rinka R, MD, 1 g at 12/27/19 1249 .  traZODone (DESYREL) tablet 25 mg, 25 mg, Oral, QHS PRN, Mansy, Jan A, MD, 25 mg at 12/26/19 2023  Physical exam:  Vitals:   12/27/19 0002 12/27/19 0620 12/27/19 0736 12/27/19 1156  BP: 129/76 102/76 (!) 129/95 122/83  Pulse: 98 78 (!) 101 (!) 110  Resp: 17 16 20 18   Temp: 98.2 F (36.8 C) 97.7 F (36.5 C) 98.9 F  (37.2 C) 97.7 F (36.5 C)  TempSrc: Oral  Oral Oral  SpO2: 93% 97% 97% 91%  Weight:      Height:       Physical Exam Constitutional:      Comments: Appears fatigued  Cardiovascular:     Rate and Rhythm: Normal rate and regular rhythm.     Heart sounds: Normal heart sounds.  Pulmonary:     Effort: Pulmonary effort is normal.     Breath sounds: Normal breath sounds.  Abdominal:     General: Bowel sounds are normal.     Palpations: Abdomen is soft.     Comments: PEG tube in place  Skin:    General: Skin is warm and dry.  Neurological:     Mental Status: He is alert and oriented to person, place, and time.      CMP Latest Ref Rng & Units 12/27/2019  Glucose 70 - 99 mg/dL 122(H)  BUN 6 - 20 mg/dL 23(H)  Creatinine 0.61 - 1.24 mg/dL 1.01  Sodium 135 - 145 mmol/L 132(L)  Potassium 3.5 - 5.1 mmol/L 4.7  Chloride 98 - 111 mmol/L 92(L)  CO2 22 - 32 mmol/L 31  Calcium 8.9 - 10.3 mg/dL 10.8(H)  Total Protein 6.5 - 8.1 g/dL -  Total Bilirubin 0.3 - 1.2 mg/dL -  Alkaline Phos 38 - 126 U/L -  AST 15 - 41 U/L -  ALT 0 - 44 U/L -   CBC Latest Ref Rng & Units 12/27/2019  WBC 4.0 - 10.5 K/uL 10.4  Hemoglobin 13.0 - 17.0 g/dL 10.8(L)  Hematocrit 39 - 52 % 33.5(L)  Platelets 150 - 400 K/uL 194    @IMAGES @  MR BRAIN W WO CONTRAST  Result Date: 12/20/2019 CLINICAL DATA:  Initial evaluation for intractable nausea, history of stage IV esophageal cancer. Evaluate for metastatic disease. EXAM: MRI HEAD WITHOUT AND WITH CONTRAST TECHNIQUE: Multiplanar, multiecho pulse sequences of the brain and surrounding structures were obtained without and with intravenous contrast. CONTRAST:  9mL GADAVIST GADOBUTROL 1 MMOL/ML IV SOLN COMPARISON:  None available. FINDINGS: Brain: Cerebral volume within normal limits for age. Few scattered foci of subcentimeter T2/FLAIR hyperintensity noted  within the periventricular deep white matter both cerebral hemispheres, nonspecific, but most like related chronic  microvascular ischemic disease, mild for age. Superimposed remote lacunar infarcts noted within the left thalamus and basal ganglia. No abnormal foci of restricted diffusion to suggest acute or subacute ischemia. Gray-white matter differentiation maintained. No encephalomalacia to suggest chronic cortical infarction elsewhere within the brain. No acute intracranial hemorrhage. Few punctate chronic micro hemorrhages noted within the cerebellum, brainstem, and about the deep gray nuclei, most likely related to chronic underlying hypertension. No mass lesion, midline shift or mass effect. No abnormal enhancement or evidence for intracranial metastatic disease. Ventricles normal size without hydrocephalus. No extra-axial fluid collection. Pituitary gland suprasellar region within normal limits. Midline structures intact. Vascular: Major intracranial vascular flow voids are maintained. Skull and upper cervical spine: Craniocervical junction within normal limits. Bone marrow signal intensity normal. No visible focal marrow replacing lesion. No scalp soft tissue abnormality. Sinuses/Orbits: Globes orbital soft tissues within normal limits. Paranasal sinuses are largely clear. No mastoid effusion. Inner ear structures within normal limits. Other: None. IMPRESSION: 1. No acute intracranial abnormality or evidence for intracranial metastatic disease. 2. Mild chronic microvascular ischemic disease with superimposed remote lacunar infarcts involving the left thalamus and basal ganglia. 3. Few punctate chronic micro hemorrhages involving the cerebellum, brainstem, and deep gray nuclei, most likely related to chronic underlying hypertension. Electronically Signed   By: Jeannine Boga M.D.   On: 12/20/2019 22:55   DG Chest Portable 1 View  Result Date: 12/19/2019 CLINICAL DATA:  Weakness. EXAM: PORTABLE CHEST 1 VIEW COMPARISON:  October 10, 2018.  PET-CT dated 11/23/2019 FINDINGS: There is a well-positioned right-sided  Port-A-Cath. Again noted is a lytic lesion involving the right glenoid. There is no pneumothorax. No large pleural effusion. There is some atelectasis at the lung bases. There is a lytic lesion involving the posterior eighth rib on the right. The heart size is unremarkable. IMPRESSION: 1. No acute cardiopulmonary process. 2. Well-positioned right-sided Port-A-Cath. 3. Lytic lesion involving the right glenoid and right eighth rib. Electronically Signed   By: Constance Holster M.D.   On: 12/19/2019 02:37   DG Abd 2 Views  Result Date: 12/19/2019 CLINICAL DATA:  Generalized weakness EXAM: ABDOMEN - 2 VIEW COMPARISON:  None. FINDINGS: The bowel gas pattern is normal. Moderate amount of right colonic stool is present. There is no evidence of free air. No radio-opaque calculi or other significant radiographic abnormality is seen. IMPRESSION: Nonobstructive bowel gas pattern. Electronically Signed   By: Prudencio Pair M.D.   On: 12/19/2019 16:30   DG HIP UNILAT WITH PELVIS 2-3 VIEWS LEFT  Result Date: 12/23/2019 CLINICAL DATA:  Bilateral hip pain, esophageal cancer with osseous metastatic disease EXAM: DG HIP (WITH OR WITHOUT PELVIS) 2-3V LEFT; DG HIP (WITH OR WITHOUT PELVIS) 2-3V RIGHT COMPARISON:  PET-CT, 11/23/2019 FINDINGS: No fracture or dislocation of the bilateral hips. There is a large, lytic lesion of the left ilium, better characterized by prior PET-CT. There are no lesions of the bilateral proximal femurs appreciated radiographically. Mild superior joint space height loss and acetabular osteophytosis of the bilateral hips. Enthesopathic change about the iliac crests. IMPRESSION: 1. No fracture or dislocation of the bilateral hips. Mild arthrosis. 2. There is a large, lytic lesion of the left ilium, better characterized by prior PET-CT. There are no lesions of the bilateral proximal femurs appreciated radiographically. Contrast enhanced MRI is the test of choice to assess for osseous metastatic lesions.  Electronically Signed   By: Eddie Candle  M.D.   On: 12/23/2019 18:57   DG HIP UNILAT WITH PELVIS 2-3 VIEWS RIGHT  Result Date: 12/23/2019 CLINICAL DATA:  Bilateral hip pain, esophageal cancer with osseous metastatic disease EXAM: DG HIP (WITH OR WITHOUT PELVIS) 2-3V LEFT; DG HIP (WITH OR WITHOUT PELVIS) 2-3V RIGHT COMPARISON:  PET-CT, 11/23/2019 FINDINGS: No fracture or dislocation of the bilateral hips. There is a large, lytic lesion of the left ilium, better characterized by prior PET-CT. There are no lesions of the bilateral proximal femurs appreciated radiographically. Mild superior joint space height loss and acetabular osteophytosis of the bilateral hips. Enthesopathic change about the iliac crests. IMPRESSION: 1. No fracture or dislocation of the bilateral hips. Mild arthrosis. 2. There is a large, lytic lesion of the left ilium, better characterized by prior PET-CT. There are no lesions of the bilateral proximal femurs appreciated radiographically. Contrast enhanced MRI is the test of choice to assess for osseous metastatic lesions. Electronically Signed   By: Eddie Candle M.D.   On: 12/23/2019 18:57     Assessment and plan- Patient is a 59 y.o. male with metastatic esophageal cancer s/p FOLFOX and Opdivo chemotherapy admitted for Significant nausea vomiting and fatigue  Nausea vomiting: Likely secondary to underlying malignancy.  Last chemo was over 3 weeks ago.  He will continue to remain on Zyprexa and Reglan.  Hypercalcemia: Corrected calcium today 12.21 I have informed Dr. Florene Glen I will need to give him 1 dose of Zometa before discharge.  This could be secondary to dehydration versus underlying malignancy.  Metastatic esophageal cancer: Systemic chemotherapy on hold until patient goes to rehab.  I will follow up with him closely while he is at the rehab and based on his performance status decide about resuming chemo upon discharge from the rehab.  He will continue to be followed up by  palliative care at rehab  Hyponatremia: Now improved to 132.  Will follow as an outpatient  Normocytic anemia: Likely secondary to underlying malignancy.  Continue to monitor   Visit Diagnosis 1. Weakness   2. Neutropenia, unspecified type ([place])   3. AKI (acute kidney injury) ([place])   4. Nausea with vomiting   5. Hip pain      Dr. Randa Evens, MD, MPH [facility] [phone] 12/27/2019 3:55 PM

## 2019-12-27 NOTE — Progress Notes (Signed)
Transport arrived to take patient to radiation tx. Patient refused. Message left for cancer center.

## 2019-12-27 NOTE — Progress Notes (Signed)
Met with patient and physician Dr. Doristine Bosworth at bedside. Discrepancy found between living will/report from chaplain and patient's current code status. Discussed code status in length with patient. Patient states right now he wishes to have compressions done and a breathing tube inserted if necessary. He will discuss further with his wife when she is available. Pt aware outcome may be poor if resuscitated. Conversation witnessed by both MD and myself.

## 2019-12-27 NOTE — TOC Progression Note (Addendum)
Transition of Care [facility]) - Progression Note    Patient Details  Name: Jacob Henson MRN: [id number] Date of Birth: 1960-03-04  Transition of Care [facility] CM/SW [place], LCSW Phone Number: 12/27/2019, 10:06 AM  Clinical Narrative:   CSW reached out to [place] and Tammy at [employer], inquired about insurance authorization status. Waiting to hear back.  11:05- CSW spoke with MD, patient medically ready to discharge to [facility] today. CSW called patient's wife who is agreeable to patient going to [facility] today. Will be in Room 709. Updated RN and MD. Will need EMS transport. MD to update Oncology. CSW asked wife if patient has his COVID vaccines, she reported he does not, CSW asked for rapid COVID test.    Expected Discharge Plan: [place] Barriers to Discharge: Continued Medical Work up  Expected Discharge Plan and Services Expected Discharge Plan: [place] arrangements for the past 2 months: Single Family Home                                       Social Determinants of Health (SDOH) Interventions    Readmission Risk Interventions No flowsheet data found.

## 2019-12-27 NOTE — Progress Notes (Signed)
Pt discharged [facility] via EMS in stable condition with wife at bedside. Report previously called to [facility].

## 2019-12-27 NOTE — Progress Notes (Signed)
[place] visited pt. as follow-up from visits last week; pt. sitting up in bed watching TV when [facility] entered; he shared he is scheduled to be discharged to [facility] today and is a little disappointed as he wants to go home; he recognized need for rehab, however.  Pt. very sleepy at time of visit and said he would like to rest, but was grateful for [facility] visit.  [facility] remains available as needed.

## 2019-12-27 NOTE — Discharge Summary (Signed)
Physician Discharge Summary  Jacob Henson:[id number] DOB: 02-18-1960 DOA: 12/19/2019  PCP: Elby Beck, FNP  Admit date: 12/19/2019 Discharge date: 12/27/2019  Admitted From: home  Disposition: SNF  Recommendations for Outpatient Follow-up:  Follow-up with PCP in 1 week Repeat CBC and BMP on follow-up visit Close follow-up with oncology Continue with feedings Continue pain medication as needed for severe pain  Home Health: None Equipment/Devices: None Discharge Condition: Stable CODE STATUS: Full code Diet recommendation: Tube feedings/full liquid  Brief/Interim Summary: Jacob Kurek Townsonis a 59 y.o.Caucasian malewith a known history of stage IVesophageal cancermetastatic to the bones, lungs and liveronactivechemotherapy and radiotherapy with last session of chemotherapy 1 week ago, and radiotherapy 2 weeks ago,GERD and hypertension,who presented to the emergency room with acute onset of progressive weakness.  Generalized weakness likelysecondary to chemotherapyas well as mild acute kidney injury and dehydration: -AKI resolved. His symptoms improved. -PT OT eval, recommended SNF placement.    Nausea and vomiting:  -Symptoms improved with decrease tube feeding rate and increased dose of Reglan. -His last chemo was more than 3 weeks ago.  He is getting radiation therapy every day.  MRI brain showed no brain mets. -Appreciate dietitian help. -Continued Zofran, Reglan, olanzapine  Hyponatremia likely hypovolemic due to volume depletion and anorexia -Secondary to nausea and vomiting -Sodium improved from 125-129-130-132 this morning with decreased rate of free water flushes. -Patient remained asymptomatic  Metastatic stage IV esophageal canceron activeradiotherapy andchemotherapy -Appreciate help from oncology -Tube feeding management per dietitian -Continue chronic pain medications-oxycodone, morphine and fentanyl. -Discussed with Dr. Janese Banks via  secure chat-okay to DC to SNF from their standpoint and close follow-up with oncology outpatient.  Pancytopenia - Likely secondary to chemotherapy.  Continued to monitor CBC and fevers, bleeding.  Patient started empirically on Cipro due to leukopenia.  As per oncology note: Continue Cipro until [place] more than 500.  WBC: WNL.  DC Cipro on 11/11  Hip pain: Reviewed x-ray which showed no fracture or dislocation of bilateral hips.  Mild arthrosis.  There is a large lytic lesion of the left ilium -Continued with the pain medications.    CKD stage IIIa: Kidney function improved after IV fluids resuscitation.  Elevated troponin secondary to demand ischemia -Trend has been flat 22 --> 18.  Patient remained asymptomatic.  Sinus tachycardia:  -Likely in the setting of pain and dehydration due to nausea and vomiting.  Continued with tube feedings.  Continued with the pain medications.  -Added low-dose of metoprolol 12.5 mg daily.  His heart rate improved  Moderate malnutrition: Related to chronic illness.  Stage IV esophageal cancer. -Continued tube feedings. -Consulted dietitian-appreciate help with tube feedings.    Hypercalcemia: Calcium noted to be 10.8.  Albumin is 2.2.  Corrected calcium: 12.2 based on albumin level.  We will give him dose of Zometa as per oncology recommendation.  CODE STATUS: Apparently patient has living will as well as chaplain's note from 11/9 which he stated no life prolonging measures however patient was full code during hospitalization.  Myself and RN Tiffany-discussed with the patient in details about CODE STATUS-he mentioned that right now he wishes to remain full code and okay with CPR and intubation if necessary.  He tells [place] that he will discuss further with his wife when she is available.  Discharge Diagnoses:  Generalized weakness Nausea and vomiting Hyponatremia Metastatic stage IV esophageal cancer Pancytopenia Hip pain CKD stage III Sinus  tachycardia Moderate malnutrition Hypoglycemia  Discharge Instructions  Discharge Instructions  Discharge instructions   Complete by: As directed    Follow-up with PCP in 1 week Repeat CBC and BMP on follow-up visit Close follow-up with oncology Continue tube feedings   Increase activity slowly   Complete by: As directed    No dressing needed   Complete by: As directed      Allergies as of 12/27/2019   No Known Allergies     Medication List    STOP taking these medications   oxyCODONE-acetaminophen 5-325 MG tablet Commonly known as: Percocet     TAKE these medications   acetaminophen 325 MG tablet Commonly known as: TYLENOL Take 2 tablets (650 mg total) by mouth every 6 (six) hours as needed for mild pain (or Fever >/= 101).   calcium carbonate 500 MG chewable tablet Commonly known as: TUMS - dosed in mg elemental calcium Chew 1 tablet (200 mg of elemental calcium total) by mouth 2 (two) times daily as needed for indigestion or heartburn.   feeding supplement (PROSource TF) liquid Place 45 mLs into feeding tube 4 (four) times daily.   fentaNYL 12 MCG/HR Commonly known as: [place] 1 patch onto the skin every 3 (three) days.   lidocaine-prilocaine cream Commonly known as: EMLA Apply to affected area once What changed:   how much to take  how to take this  when to take this   magnesium hydroxide 400 MG/5ML suspension Commonly known as: MILK OF MAGNESIA Take 30 mLs by mouth daily as needed for mild constipation.   metoCLOPramide 5 MG/5ML solution Commonly known as: REGLAN Take 10 mLs (10 mg total) by mouth 3 (three) times daily before meals. What changed: how much to take   metoprolol tartrate 25 MG tablet Commonly known as: LOPRESSOR Take 0.5 tablets (12.5 mg total) by mouth daily. Start taking on: December 28, 2019   OLANZapine 10 MG tablet Commonly known as: ZYPREXA Take 1 tablet (10 mg total) by mouth at bedtime.   ondansetron 8 MG  disintegrating tablet Commonly known as: ZOFRAN-ODT Take 1 tablet (8 mg total) by mouth every 8 (eight) hours as needed for nausea or vomiting.   oxyCODONE 5 MG/5ML solution Commonly known as: ROXICODONE Take 5 mLs (5 mg total) by mouth every 4 (four) hours as needed for severe pain.   pantoprazole 40 MG tablet Commonly known as: PROTONIX Take 40 mg by mouth 2 (two) times daily.   sennosides 8.8 MG/5ML syrup Commonly known as: SENOKOT Take 10 mLs by mouth at bedtime. Through peg tube, and hold if diarrhea.   sucralfate 1 GM/10ML suspension Commonly known as: CARAFATE Take 1 g by mouth 4 (four) times daily as needed (stomach pain).   traZODone 50 MG tablet Commonly known as: DESYREL Take 0.5 tablets (25 mg total) by mouth at bedtime as needed for sleep.            Discharge Care Instructions  (From admission, onward)         Start     Ordered   12/27/19 0000  No dressing needed        12/27/19 1141          Follow-up Information    Elby Beck, FNP Follow up in 1 week(s).   Specialties: Nurse Practitioner, Family Medicine Contact information: [place] [place] [address] [phone]        Sindy Guadeloupe, MD Follow up in 1 week(s).   Specialty: Oncology Contact information: [place]  [phone]              No Known Allergies  Consultations:  Oncology  Palliative care   Procedures/Studies: MR BRAIN W WO CONTRAST  Result Date: 12/20/2019 CLINICAL DATA:  Initial evaluation for intractable nausea, history of stage IV esophageal cancer. Evaluate for metastatic disease. EXAM: MRI HEAD WITHOUT AND WITH CONTRAST TECHNIQUE: Multiplanar, multiecho pulse sequences of the brain and surrounding structures were obtained without and with intravenous contrast. CONTRAST:  67mL GADAVIST GADOBUTROL 1 MMOL/ML IV SOLN COMPARISON:  None available. FINDINGS: Brain: Cerebral volume within normal limits for age.  Few scattered foci of subcentimeter T2/FLAIR hyperintensity noted within the periventricular deep white matter both cerebral hemispheres, nonspecific, but most like related chronic microvascular ischemic disease, mild for age. Superimposed remote lacunar infarcts noted within the left thalamus and basal ganglia. No abnormal foci of restricted diffusion to suggest acute or subacute ischemia. Gray-white matter differentiation maintained. No encephalomalacia to suggest chronic cortical infarction elsewhere within the brain. No acute intracranial hemorrhage. Few punctate chronic micro hemorrhages noted within the cerebellum, brainstem, and about the deep gray nuclei, most likely related to chronic underlying hypertension. No mass lesion, midline shift or mass effect. No abnormal enhancement or evidence for intracranial metastatic disease. Ventricles normal size without hydrocephalus. No extra-axial fluid collection. Pituitary gland suprasellar region within normal limits. Midline structures intact. Vascular: Major intracranial vascular flow voids are maintained. Skull and upper cervical spine: Craniocervical junction within normal limits. Bone marrow signal intensity normal. No visible focal marrow replacing lesion. No scalp soft tissue abnormality. Sinuses/Orbits: Globes orbital soft tissues within normal limits. Paranasal sinuses are largely clear. No mastoid effusion. Inner ear structures within normal limits. Other: None. IMPRESSION: 1. No acute intracranial abnormality or evidence for intracranial metastatic disease. 2. Mild chronic microvascular ischemic disease with superimposed remote lacunar infarcts involving the left thalamus and basal ganglia. 3. Few punctate chronic micro hemorrhages involving the cerebellum, brainstem, and deep gray nuclei, most likely related to chronic underlying hypertension. Electronically Signed   By: Jeannine Boga M.D.   On: 12/20/2019 22:55   DG Chest Portable 1  View  Result Date: 12/19/2019 CLINICAL DATA:  Weakness. EXAM: PORTABLE CHEST 1 VIEW COMPARISON:  October 10, 2018.  PET-CT dated 11/23/2019 FINDINGS: There is a well-positioned right-sided Port-A-Cath. Again noted is a lytic lesion involving the right glenoid. There is no pneumothorax. No large pleural effusion. There is some atelectasis at the lung bases. There is a lytic lesion involving the posterior eighth rib on the right. The heart size is unremarkable. IMPRESSION: 1. No acute cardiopulmonary process. 2. Well-positioned right-sided Port-A-Cath. 3. Lytic lesion involving the right glenoid and right eighth rib. Electronically Signed   By: Constance Holster M.D.   On: 12/19/2019 02:37   DG Abd 2 Views  Result Date: 12/19/2019 CLINICAL DATA:  Generalized weakness EXAM: ABDOMEN - 2 VIEW COMPARISON:  None. FINDINGS: The bowel gas pattern is normal. Moderate amount of right colonic stool is present. There is no evidence of free air. No radio-opaque calculi or other significant radiographic abnormality is seen. IMPRESSION: Nonobstructive bowel gas pattern. Electronically Signed   By: Prudencio Pair M.D.   On: 12/19/2019 16:30   DG HIP UNILAT WITH PELVIS 2-3 VIEWS LEFT  Result Date: 12/23/2019 CLINICAL DATA:  Bilateral hip pain, esophageal cancer with osseous metastatic disease EXAM: DG HIP (WITH OR WITHOUT PELVIS) 2-3V LEFT; DG HIP (WITH OR WITHOUT PELVIS) 2-3V RIGHT COMPARISON:  PET-CT, 11/23/2019 FINDINGS: No fracture or dislocation of  the bilateral hips. There is a large, lytic lesion of the left ilium, better characterized by prior PET-CT. There are no lesions of the bilateral proximal femurs appreciated radiographically. Mild superior joint space height loss and acetabular osteophytosis of the bilateral hips. Enthesopathic change about the iliac crests. IMPRESSION: 1. No fracture or dislocation of the bilateral hips. Mild arthrosis. 2. There is a large, lytic lesion of the left ilium, better  characterized by prior PET-CT. There are no lesions of the bilateral proximal femurs appreciated radiographically. Contrast enhanced MRI is the test of choice to assess for osseous metastatic lesions. Electronically Signed   By: Eddie Candle M.D.   On: 12/23/2019 18:57   DG HIP UNILAT WITH PELVIS 2-3 VIEWS RIGHT  Result Date: 12/23/2019 CLINICAL DATA:  Bilateral hip pain, esophageal cancer with osseous metastatic disease EXAM: DG HIP (WITH OR WITHOUT PELVIS) 2-3V LEFT; DG HIP (WITH OR WITHOUT PELVIS) 2-3V RIGHT COMPARISON:  PET-CT, 11/23/2019 FINDINGS: No fracture or dislocation of the bilateral hips. There is a large, lytic lesion of the left ilium, better characterized by prior PET-CT. There are no lesions of the bilateral proximal femurs appreciated radiographically. Mild superior joint space height loss and acetabular osteophytosis of the bilateral hips. Enthesopathic change about the iliac crests. IMPRESSION: 1. No fracture or dislocation of the bilateral hips. Mild arthrosis. 2. There is a large, lytic lesion of the left ilium, better characterized by prior PET-CT. There are no lesions of the bilateral proximal femurs appreciated radiographically. Contrast enhanced MRI is the test of choice to assess for osseous metastatic lesions. Electronically Signed   By: Eddie Candle M.D.   On: 12/23/2019 18:57      Subjective: Patient seen and examined.  Tells me he is overall feeling better.  Continues to have weakness that he does not want to go for radiation therapy this morning.  He denies any other symptoms such as chest pain, shortness of breath, palpitation, leg swelling, nausea, vomiting, abdominal pain, bowel changes.  Comfortable going to SNF today.  Wife notified by case manager regarding SNF placement and she agreed with it.  Discharge Exam: Vitals:   12/27/19 0620 12/27/19 0736  BP: 102/76 (!) 129/95  Pulse: 78 (!) 101  Resp: 16 20  Temp: 97.7 F (36.5 C) 98.9 F (37.2 C)  SpO2: 97% 97%    Vitals:   12/26/19 1957 12/27/19 0002 12/27/19 0620 12/27/19 0736  BP: (!) 129/91 129/76 102/76 (!) 129/95  Pulse: 99 98 78 (!) 101  Resp: 20 17 16 20   Temp: 98.3 F (36.8 C) 98.2 F (36.8 C) 97.7 F (36.5 C) 98.9 F (37.2 C)  TempSrc: Oral Oral  Oral  SpO2: 93% 93% 97% 97%  Weight:      Height:        General: Pt is alert, awake, not in acute distress, on room air, communicating well Cardiovascular: RRR, S1/S2 +, no rubs, no gallops Respiratory: CTA bilaterally, no wheezing, no rhonchi Abdominal: Soft, NT, ND, bowel sounds + has PEG tube. Extremities: no edema, no cyanosis    The results of significant diagnostics from this hospitalization (including imaging, microbiology, ancillary and laboratory) are listed below for reference.     Microbiology: Recent Results (from the past 240 hour(s))  Respiratory Panel by RT PCR (Flu A&B, Covid) - Nasopharyngeal Swab     Status: None   Collection Time: 12/19/19  1:50 AM   Specimen: Nasopharyngeal Swab  Result Value Ref Range Status   SARS Coronavirus 2 by  RT PCR NEGATIVE NEGATIVE Final    Comment: (NOTE) SARS-CoV-2 target nucleic acids are NOT DETECTED.  The SARS-CoV-2 RNA is generally detectable in upper respiratoy specimens during the acute phase of infection. The lowest concentration of SARS-CoV-2 viral copies this assay can detect is 131 copies/mL. A negative result does not preclude SARS-Cov-2 infection and should not be used as the sole basis for treatment or other patient management decisions. A negative result may occur with  improper specimen collection/handling, submission of specimen other than nasopharyngeal swab, presence of viral mutation(s) within the areas targeted by this assay, and inadequate number of viral copies (<131 copies/mL). A negative result must be combined with clinical observations, patient history, and epidemiological information. The expected result is Negative.  Fact Sheet for Patients:   PinkCheek.be  Fact Sheet for Healthcare Providers:  GravelBags.it  This test is no t yet approved or cleared by the [place] FDA and  has been authorized for detection and/or diagnosis of SARS-CoV-2 by FDA under an Emergency Use Authorization (EUA). This EUA will remain  in effect (meaning this test can be used) for the duration of the COVID-19 declaration under Section 564(b)(1) of the Act, 21 U.S.C. section 360bbb-3(b)(1), unless the authorization is terminated or revoked sooner.     Influenza A by PCR NEGATIVE NEGATIVE Final   Influenza B by PCR NEGATIVE NEGATIVE Final    Comment: (NOTE) The Xpert Xpress SARS-CoV-2/FLU/RSV assay is intended as an aid in  the diagnosis of influenza from Nasopharyngeal swab specimens and  should not be used as a sole basis for treatment. Nasal washings and  aspirates are unacceptable for Xpert Xpress SARS-CoV-2/FLU/RSV  testing.  Fact Sheet for Patients: PinkCheek.be  Fact Sheet for Healthcare Providers: GravelBags.it  This test is not yet approved or cleared by the [place] FDA and  has been authorized for detection and/or diagnosis of SARS-CoV-2 by  FDA under an Emergency Use Authorization (EUA). This EUA will remain  in effect (meaning this test can be used) for the duration of the  Covid-19 declaration under Section 564(b)(1) of the Act, 21  U.S.C. section 360bbb-3(b)(1), unless the authorization is  terminated or revoked. Performed at [facility], [address] [place], [place] [address]      Labs: BNP (last 3 results) No results for input(s): BNP in the last 8760 hours. Basic Metabolic Panel: Recent Labs  Lab 12/23/19 0627 12/24/19 0459 12/25/19 0507 12/26/19 0650 12/27/19 0543  NA 129* 125* 129* 130* 132*  K 4.0 4.2 4.4 4.4 4.7  CL 91* 88* 89* 90* 92*  CO2 31 31 29  32 31  GLUCOSE  136* 136* 106* 126* 122*  BUN 20 20 21* 21* 23*  CREATININE 1.10 1.05 1.02 0.96 1.01  CALCIUM 9.9 9.9 10.1 10.5* 10.8*   Liver Function Tests: Recent Labs  Lab 12/27/19 0543  ALBUMIN 2.2*   No results for input(s): LIPASE, AMYLASE in the last 168 hours. No results for input(s): AMMONIA in the last 168 hours. CBC: Recent Labs  Lab 12/21/19 0622 12/22/19 0527 12/23/19 0627 12/25/19 0507 12/27/19 0543  WBC 2.5* 5.0 6.3 10.6* 10.4  HGB 9.8* 10.1* 10.2* 10.7* 10.8*  HCT 29.8* 30.7* 31.0* 32.6* 33.5*  MCV 93.1 92.2 92.0 91.1 92.3  PLT 175 197 201 205 194   Cardiac Enzymes: No results for input(s): CKTOTAL, CKMB, CKMBINDEX, TROPONINI in the last 168 hours. BNP: Invalid input(s): POCBNP CBG: Recent Labs  Lab 12/22/19 0914 12/22/19 1219 12/22/19 1611  GLUCAP 145*  146* 104*   D-Dimer No results for input(s): DDIMER in the last 72 hours. Hgb A1c No results for input(s): HGBA1C in the last 72 hours. Lipid Profile No results for input(s): CHOL, HDL, LDLCALC, TRIG, CHOLHDL, LDLDIRECT in the last 72 hours. Thyroid function studies No results for input(s): TSH, T4TOTAL, T3FREE, THYROIDAB in the last 72 hours.  Invalid input(s): FREET3 Anemia work up No results for input(s): VITAMINB12, FOLATE, FERRITIN, TIBC, IRON, RETICCTPCT in the last 72 hours. Urinalysis    Component Value Date/Time   COLORURINE YELLOW (A) 12/19/2019 0709   APPEARANCEUR CLOUDY (A) 12/19/2019 0709   LABSPEC 1.024 12/19/2019 0709   PHURINE 5.0 12/19/2019 0709   GLUCOSEU NEGATIVE 12/19/2019 0709   HGBUR NEGATIVE 12/19/2019 0709   BILIRUBINUR NEGATIVE 12/19/2019 0709   KETONESUR NEGATIVE 12/19/2019 0709   PROTEINUR 30 (A) 12/19/2019 0709   NITRITE NEGATIVE 12/19/2019 0709   LEUKOCYTESUR NEGATIVE 12/19/2019 0709   Sepsis Labs Invalid input(s): PROCALCITONIN,  WBC,  LACTICIDVEN Microbiology Recent Results (from the past 240 hour(s))  Respiratory Panel by RT PCR (Flu A&B, Covid) - Nasopharyngeal  Swab     Status: None   Collection Time: 12/19/19  1:50 AM   Specimen: Nasopharyngeal Swab  Result Value Ref Range Status   SARS Coronavirus 2 by RT PCR NEGATIVE NEGATIVE Final    Comment: (NOTE) SARS-CoV-2 target nucleic acids are NOT DETECTED.  The SARS-CoV-2 RNA is generally detectable in upper respiratoy specimens during the acute phase of infection. The lowest concentration of SARS-CoV-2 viral copies this assay can detect is 131 copies/mL. A negative result does not preclude SARS-Cov-2 infection and should not be used as the sole basis for treatment or other patient management decisions. A negative result may occur with  improper specimen collection/handling, submission of specimen other than nasopharyngeal swab, presence of viral mutation(s) within the areas targeted by this assay, and inadequate number of viral copies (<131 copies/mL). A negative result must be combined with clinical observations, patient history, and epidemiological information. The expected result is Negative.  Fact Sheet for Patients:  PinkCheek.be  Fact Sheet for Healthcare Providers:  GravelBags.it  This test is no t yet approved or cleared by the [place] FDA and  has been authorized for detection and/or diagnosis of SARS-CoV-2 by FDA under an Emergency Use Authorization (EUA). This EUA will remain  in effect (meaning this test can be used) for the duration of the COVID-19 declaration under Section 564(b)(1) of the Act, 21 U.S.C. section 360bbb-3(b)(1), unless the authorization is terminated or revoked sooner.     Influenza A by PCR NEGATIVE NEGATIVE Final   Influenza B by PCR NEGATIVE NEGATIVE Final    Comment: (NOTE) The Xpert Xpress SARS-CoV-2/FLU/RSV assay is intended as an aid in  the diagnosis of influenza from Nasopharyngeal swab specimens and  should not be used as a sole basis for treatment. Nasal washings and  aspirates are  unacceptable for Xpert Xpress SARS-CoV-2/FLU/RSV  testing.  Fact Sheet for Patients: PinkCheek.be  Fact Sheet for Healthcare Providers: GravelBags.it  This test is not yet approved or cleared by the [place] FDA and  has been authorized for detection and/or diagnosis of SARS-CoV-2 by  FDA under an Emergency Use Authorization (EUA). This EUA will remain  in effect (meaning this test can be used) for the duration of the  Covid-19 declaration under Section 564(b)(1) of the Act, 21  U.S.C. section 360bbb-3(b)(1), unless the authorization is  terminated or revoked. Performed at [facility], [place]  [address] [place], [place] [address]      Time coordinating discharge: Over 30 minutes  SIGNED:   Mckinley Jewel, MD  [facility] 12/27/2019, 11:41 AM Pager   If 7PM-7AM, please contact night-coverage [email or web address]

## 2019-12-27 NOTE — Plan of Care (Signed)
  Problem: Education: Goal: Knowledge of General Education information will improve Description Including pain rating scale, medication(s)/side effects and non-pharmacologic comfort measures Outcome: Progressing   

## 2019-12-27 NOTE — TOC Transition Note (Signed)
Transition of Care [facility]) - CM/SW Discharge Note   Patient Details  Name: Jacob Henson MRN: [id number] Date of Birth: 06-01-1960  Transition [facility] Care [facility] CM/SW Contact:  Jacob Ivan, LCSW Phone Number: 12/27/2019, 1:57 PM   Clinical Narrative:   Patient is discharging to [facility] today, Room 709. [facility] to [facility] Medical Necessity and Face Sheet placed in Discharge Packet. [facility] transport arranged for 3:30 pick up time. No additional needs identified. CSW updated wife, Jacob Henson.     Final next level of care: Skilled Nursing Facility Barriers to Discharge: Barriers Resolved   Patient Goals and CMS Choice Patient states their goals for this hospitalization and ongoing recovery are:: SNF rehab CMS Medicare.gov Compare Post Acute Care list provided to:: Patient Represenative (must comment) Choice offered to / list presented to : Spouse  Discharge Placement              Patient chooses bed at: [facility] Patient to be transferred to facility by: [facility] Name of family member notified: Jacob Henson wife Patient and family notified of of transfer: 12/27/19  Discharge Plan and Services                                     Social Determinants of Health (SDOH) Interventions     Readmission Risk Interventions No flowsheet data found.

## 2019-12-28 ENCOUNTER — Ambulatory Visit
Admission: RE | Admit: 2019-12-28 | Discharge: 2019-12-28 | Disposition: A | Discharge status: Home / Self Care | Payer: 59 | Source: Ambulatory Visit | Attending: Radiation Oncology | Admitting: Radiation Oncology

## 2019-12-28 DIAGNOSIS — Z87891 Personal history of nicotine dependence: Secondary | ICD-10-CM | POA: Diagnosis not present

## 2019-12-28 DIAGNOSIS — Z5112 Encounter for antineoplastic immunotherapy: Secondary | ICD-10-CM | POA: Diagnosis present

## 2019-12-28 DIAGNOSIS — C159 Malignant neoplasm of esophagus, unspecified: Secondary | ICD-10-CM | POA: Diagnosis present

## 2019-12-28 DIAGNOSIS — E871 Hypo-osmolality and hyponatremia: Secondary | ICD-10-CM | POA: Diagnosis not present

## 2019-12-28 DIAGNOSIS — Z1152 Encounter for screening for COVID-19: Secondary | ICD-10-CM | POA: Diagnosis not present

## 2019-12-28 DIAGNOSIS — G893 Neoplasm related pain (acute) (chronic): Secondary | ICD-10-CM | POA: Diagnosis not present

## 2019-12-28 DIAGNOSIS — Z5111 Encounter for antineoplastic chemotherapy: Secondary | ICD-10-CM | POA: Diagnosis not present

## 2019-12-28 DIAGNOSIS — D701 Agranulocytosis secondary to cancer chemotherapy: Secondary | ICD-10-CM | POA: Diagnosis not present

## 2019-12-28 DIAGNOSIS — Z5189 Encounter for other specified aftercare: Secondary | ICD-10-CM | POA: Diagnosis not present

## 2019-12-28 DIAGNOSIS — C7951 Secondary malignant neoplasm of bone: Secondary | ICD-10-CM | POA: Diagnosis not present

## 2019-12-28 DIAGNOSIS — T451X5A Adverse effect of antineoplastic and immunosuppressive drugs, initial encounter: Secondary | ICD-10-CM | POA: Diagnosis not present

## 2019-12-28 DIAGNOSIS — Z510 Encounter for antineoplastic radiation therapy: Secondary | ICD-10-CM | POA: Diagnosis present

## 2019-12-29 ENCOUNTER — Ambulatory Visit
Admission: RE | Admit: 2019-12-29 | Discharge: 2019-12-29 | Disposition: A | Discharge status: Home / Self Care | Payer: 59 | Source: Ambulatory Visit | Attending: Radiation Oncology | Admitting: Radiation Oncology

## 2019-12-29 DIAGNOSIS — Z5112 Encounter for antineoplastic immunotherapy: Secondary | ICD-10-CM | POA: Diagnosis not present

## 2019-12-30 ENCOUNTER — Inpatient Hospital Stay: Payer: 59

## 2019-12-30 ENCOUNTER — Other Ambulatory Visit: Payer: Self-pay

## 2019-12-30 ENCOUNTER — Ambulatory Visit
Admission: RE | Admit: 2019-12-30 | Discharge: 2019-12-30 | Disposition: A | Discharge status: Home / Self Care | Payer: 59 | Source: Ambulatory Visit | Attending: Radiation Oncology | Admitting: Radiation Oncology

## 2019-12-30 DIAGNOSIS — Z5112 Encounter for antineoplastic immunotherapy: Secondary | ICD-10-CM | POA: Diagnosis not present

## 2019-12-30 NOTE — Progress Notes (Signed)
Nutrition Follow-up:  Patient with stage IV esophageal cancer.  Patient recently admitted to hospital 11/7-11/15/2021 and then admitted to [facility]. Chemotherapy on hold. Patient receiving radiation currently.   Spoke with patient via phone. Patient reports that he drank milk and 1/2 orange juice this am without vomiting.  Patient burping during conversation.  Patient does not know what the name of tube feeding [facility] is at [facility]. Thinks he is receiving the tube feeding just at night.  Patient agreeable for RD to contact [facility] Dietitian.  Reports bowel movement last night. Has not been nauseated this am. Thinks he did not get nauseated yesterday.  Was too tired to work with PT yesterday.  Said he was left in chair for 3 hours and too tired to work with PT after that.  Thinks he had a bowel movement yesterday.   Inpatient RD notes reviewed. Formula changed to osmolite 1.5 initially at 78ml/hr for 24 hours then rate reduced to 41ml/hr for 24 hr due to intolerance. Prosource 5ml TF BID. Free water flush of 143ml q 4 hour.    Medications: reviewed  Labs: 11/15 (hospital) Na 132, glucose 122, 10.8  Anthropometrics:   Weight 262 lb (11/12 admission bed scale).   287 lb on 11/1 Dr Corlis Leak office 257 lb on 10/28 247 lb on 10/26 267 lb on 10/15   Re-Estimated Energy Needs  Kcals: 2600-2975 Protein: 130-148 g Fluid: > 2.6 L  NUTRITION DIAGNOSIS: Inadequate oral intake continues   INTERVENTION:  RD called [facility] and left voicemail for RD to call back.     MONITORING, EVALUATION, GOAL: weight trends, tube feeding   NEXT VISIT: Dec 2 at radiation appointment  Jacob Henson, [place], [place] Registered Dietitian [phone] (mobile)

## 2019-12-31 ENCOUNTER — Non-Acute Institutional Stay: Payer: Self-pay | Admitting: Primary Care

## 2019-12-31 ENCOUNTER — Ambulatory Visit
Admission: RE | Admit: 2019-12-31 | Discharge: 2019-12-31 | Disposition: A | Discharge status: Home / Self Care | Payer: 59 | Source: Ambulatory Visit | Attending: Radiation Oncology | Admitting: Radiation Oncology

## 2019-12-31 ENCOUNTER — Encounter: Payer: Self-pay | Admitting: Oncology

## 2019-12-31 DIAGNOSIS — Z5112 Encounter for antineoplastic immunotherapy: Secondary | ICD-10-CM | POA: Diagnosis not present

## 2019-12-31 DIAGNOSIS — Z515 Encounter for palliative care: Secondary | ICD-10-CM

## 2019-12-31 DIAGNOSIS — E440 Moderate protein-calorie malnutrition: Secondary | ICD-10-CM

## 2019-12-31 DIAGNOSIS — C159 Malignant neoplasm of esophagus, unspecified: Secondary | ICD-10-CM

## 2019-12-31 DIAGNOSIS — C158 Malignant neoplasm of overlapping sites of esophagus: Secondary | ICD-10-CM

## 2019-12-31 NOTE — Progress Notes (Signed)
[facility] Palliative Care Consult Note Telephone: [phone]  Fax: [phone]     Date of encounter: 12/31/19 PATIENT NAME: Jacob Henson [place] [address] [phone] (home) [phone] (work) DOB: 02-04-1961 MRN: [id number]  PRIMARY CARE PROVIDER:    Rica Koyanagi, MD,  [place] [address] [phone]  REFERRING PROVIDER:   Rica Koyanagi, MD [address] [place],  [place] [address] [phone]  RESPONSIBLE PARTY:   Extended Emergency Contact Information Primary Emergency Contact: Samudio,Carol Address: [address]          [place], [place] [address] [place] Phone: [phone] Work Phone: [phone] Mobile Phone: [phone] Relation: Spouse Secondary Emergency Contact: [place] Phone: [phone] Mobile Phone: [phone] Relation: None  I met face to face with patient in facility. Palliative Care was asked to follow this patient by consultation request of Rica Koyanagi, MD to help address advance care planning and goals of care. This is the initial visit.   ASSESSMENT AND RECOMMENDATIONS:   1. Advance Care Planning/Goals of Care: Goals include to maximize quality of life and symptom management. States his wife can answer my questions, phone to Mrs. Mccathern, no answer, message left.   2. Symptom Management:   Pain management: Denies at this time. Has PRN for pain management.  Nutrition: Denies any PO ability. Taking tube feeds currently Discussed with SNF staff, report 80 ml/ hr with water flushes. Pt denies hunger.  Mobility: States he gets oob to chair. Has gone out today for appt and endorses fatigue.  3. Follow up Palliative Care Visit: Palliative care will continue to follow for goals of care clarification and symptom management. Return 1-2 weeks or prn.  4. Family /Caregiver/Community Supports: Wife is PR, currently in SNF,  going to Rad Tx.  5. Cognitive / Functional decline: A and O x 2-3, fatigued, needs assistance with all adls and iadls.  I spent 35 minutes providing this consultation,  from 1200 to 1235. More than 50% of the time in this consultation was spent coordinating communication.   CODE STATUS: FULL CODE  PPS: 40%  HOSPICE ELIGIBILITY/DIAGNOSIS: TBD  Subjective:  CHIEF COMPLAINT: debility  HISTORY OF PRESENT ILLNESS:  ADVAIT Henson is a 59 y.o. year old male  with metastatic esophageal cancer, with bone mets and pain. Not able to take PT and has tube feeds.  Recently starting radiation therapy.We are asked to consult around advance care planning and symptom management.    History obtained from review of EMR, discussion with primary team, and  interview with family, caregiver  and/or Mr. Condon. Records reviewed and summarized above.    Review and summarization of old Epic records shows or history from other than patient  shows history of dx of mass in 9/21. Presented with n/v and weight loss. Bx done and protocols determined with FOLFOX and rad tx. .  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   Mass of joint of right shoulder  10/28/2019   Essential hypertension 10/28/2019   GERD (gastroesophageal reflux disease) 10/28/2019   PAST MEDICAL HISTORY:  Active Ambulatory Problems    Diagnosis Date Noted   Mass of joint of right shoulder 10/28/2019   Essential hypertension 10/28/2019   GERD (gastroesophageal reflux disease)  10/28/2019   Goals of care, counseling/discussion 11/15/2019   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   Palliative care patient 11/30/2019   Weakness 12/19/2019   AKI (acute kidney injury) ([place])    Nausea and vomiting    Malignant neoplasm of overlapping sites of esophagus (HCC)    Anemia    Thrombocytopenia (HCC)    Neutropenia ([place])    Palliative care encounter    Malnutrition of moderate degree 12/23/2019   Resolved Ambulatory Problems    Diagnosis Date Noted   No Resolved Ambulatory Problems   Past Medical History:  Diagnosis Date   Allergy    Cancer ([place])    Esophageal cancer ([place])    Hypertension    SOCIAL HX:  Social History   Tobacco Use   Smoking status: Former Smoker    Packs/day: 1.00    Years: 4.00    Pack years: 4.00    Types: Cigarettes    Quit date: 10/14/1978    Years since quitting: 41.2   Smokeless tobacco: Never Used  Substance Use Topics   Alcohol use: No   FAMILY HX:  Family History  Problem Relation Age of Onset   Heart disease Father    Hearing loss Father    Diabetes Father    Heart disease Paternal Grandmother    Diabetes Paternal Grandmother    Heart attack Paternal Grandfather     ALLERGIES: No Known Allergies   PERTINENT MEDICATIONS:  Outpatient Encounter Medications as of 12/31/2019  Medication Sig   acetaminophen (TYLENOL) 325 MG tablet Take 2 tablets (650 mg total) by mouth every 6 (six) hours as needed for mild pain (or Fever >/= 101). (Patient taking differently: Place 650 mg into feeding tube every 6 (six) hours as needed for mild pain (or Fever >/= 101). )   calcium carbonate (TUMS - DOSED IN MG ELEMENTAL CALCIUM) 500 MG chewable tablet Chew 1 tablet (200 mg of elemental calcium total) by mouth 2 (two) times daily as needed for indigestion or heartburn. (Patient taking differently: Place 1 tablet into feeding tube 2 (two) times daily as needed for indigestion or heartburn. )   fentaNYL  (DURAGESIC) 12 MCG/HR Place 1 patch onto the skin every 3 (three) days.   lidocaine-prilocaine (EMLA) cream Apply to affected area once (Patient taking differently: Apply 1 application topically once. Apply to affected area once)   magnesium hydroxide (MILK OF MAGNESIA) 400 MG/5ML suspension Take 30 mLs by mouth daily as needed for mild constipation. (Patient taking differently: Place 30 mLs into feeding tube daily as needed for mild constipation. )   metoCLOPramide (REGLAN) 5 MG/5ML solution Take 10 mLs (10 mg total) by mouth 3 (three) times daily before meals.   metoprolol tartrate (LOPRESSOR) 25 MG tablet Take 0.5 tablets (12.5 mg total) by mouth daily. (Patient taking differently: Place 12.5 mg into feeding tube daily. )   Nutritional Supplements (FEEDING SUPPLEMENT, PROSOURCE TF,) liquid Place 45 mLs into feeding tube 4 (four) times daily.   OLANZapine (ZYPREXA) 10 MG tablet Take 1 tablet (10 mg total) by mouth at bedtime. (Patient taking differently: Place 10 mg into feeding tube at bedtime. )   ondansetron (ZOFRAN-ODT) 8 MG disintegrating tablet Take 1 tablet (8 mg  total) by mouth every 8 (eight) hours as needed for nausea or vomiting. (Patient taking differently: Place 8 mg into feeding tube every 8 (eight) hours as needed for nausea or vomiting. )   oxyCODONE (ROXICODONE) 5 MG/5ML solution Take 5 mLs (5 mg total) by mouth every 4 (four) hours as needed for severe pain. (Patient taking differently: Place 5 mg into feeding tube every 4 (four) hours as needed for severe pain. )   pantoprazole sodium (PROTONIX) 40 mg/20 mL PACK 40 mg 2 (two) times daily. Per tube   sennosides (SENOKOT) 8.8 MG/5ML syrup Take 10 mLs by mouth at bedtime. Through peg tube, and hold if diarrhea.   sucralfate (CARAFATE) 1 GM/10ML suspension Take 1 g by mouth 4 (four) times daily as needed (stomach pain).    traZODone (DESYREL) 50 MG tablet Take 0.5 tablets (25 mg total) by mouth at bedtime as needed for sleep.  (Patient taking differently: Place 25 mg into feeding tube at bedtime as needed for sleep. )   No facility-administered encounter medications on file as of 12/31/2019.    Objective: ROS  General: NAD ENMT:endorses NPO Cardiovascular: denies chest pain Pulmonary: denies  cough, denies increased SOB,  MSK:  endorses ROM limitations, no falls reported Skin: denies rashes or wounds Neurological: endorses weakness, denies pain currently Psych: Endorses relaxed mood Heme/lymph/immuno: denies bruises, abnormal bleeding  Physical Exam: Current and past weights:329 in 10/19, currently 250 lbs.24% weight loss. Dx with cancer 9/21 with 60 lbs loss in the previous 3 months. Constitutional:  NAD General :frail appearing, Obese  EYES: anicteric sclera,lids intact, no discharge  ENMT: intact hearing,oral mucous membranes moist, dentition intact CV:  no LE edema Pulmonary:  no increased work of breathing, no cough Abdomen: intake per feeding tube 100%,, no ascites GU: deferred MSK: mild sacropenia, decreased ROM in all extremities, no contractures of LE, non ambulatory Skin: warm and dry, no rashes or wounds on visible skin Neuro: Weakness, mild cognitive impairment Psych: relaxed affect, interactive and endorses fatigue Hem/lymph/immuno: no widespread bruising   Thank you for the opportunity to participate in the care of Mr. Langner.  The palliative care team will continue to follow. Please call our office at [phone] if we can be of additional assistance.  Jason Coop, NP , DNP, MPH, AGPCNP-BC, ACHPN  COVID-19 PATIENT SCREENING TOOL  Person answering questions: ____________staff______ _____   1.  Is the patient or any family member in the home showing any signs or symptoms regarding respiratory infection?               Person with Symptom- __________NA_________________  a. Fever                                                                          Yes___ No___           ___________________  b. Shortness of breath                                                    Yes___ No___  ___________________ c. Cough/congestion                                       Yes___  No___         ___________________ d. Body aches/pains                                                         Yes___ No___        ____________________ e. Gastrointestinal symptoms (diarrhea, nausea)           Yes___ No___        ____________________  2. Within the past 14 days, has anyone living in the home had any contact with someone with or under investigation for COVID-19?    Yes___ No_X_   Person __________________

## 2020-01-03 ENCOUNTER — Ambulatory Visit: Payer: 59

## 2020-01-03 ENCOUNTER — Telehealth: Payer: Self-pay

## 2020-01-03 NOTE — Telephone Encounter (Signed)
Nutrition Follow-up:  Acute add on for today from Dr Grayland Ormond.  Wife called over the weekend concerned about tube feeding at SNF and spoke with Dr. Grayland Ormond.  RD to follow-up with facility and wife today.   RD called wife via phone for nutrition follow-up.  Wife unsure what tube feeding patient is being given. Concerned that tube feeding is not being hooked up in timely manner. Feels that patient has gotten weaker. Currently NPO with ice chips per wife.  Wife has multiple concerns about nursing staff and has addressed issued with nursing supervisor at facility.  Spoke with RD, Apolonio Schneiders at [facility].  She is at the facility every other Monday.  She is planning on adjusting tube feeding order to 115 ml/hr for 13 hours and giving prostat to account for time he is out of the facility for radiation. Total calories from feeding ~2800, 192 g protein.  Reports patient had bowel movement yesterday. Currently not on reglan. Has been NPO due to SLP recommendations.        Anthropometrics:   Weight 250 lb on admission to [facility] per [place].     NUTRITION DIAGNOSIS: Inadequate oral intake continues   INTERVENTION:  Coordination of care provided with RD at [facility].  Apolonio Schneiders agreeable for wife to call her with questions or concerns Spoke with wife and let her know that this RD had spoken with Apolonio Schneiders.  Provided wife Jacob Henson's contact information.   Encouraged wife to continue to speak with facility leadership/administration regarding her concerns.       MONITORING, EVALUATION, GOAL: weight trends, tube feeding tolerance   NEXT VISIT: Dec 2 after radiation  Jacob Henson, [place], [place] Registered Dietitian [phone] (mobile)

## 2020-01-04 ENCOUNTER — Ambulatory Visit
Admission: RE | Admit: 2020-01-04 | Discharge: 2020-01-04 | Disposition: A | Discharge status: Home / Self Care | Payer: 59 | Source: Ambulatory Visit | Attending: Radiation Oncology | Admitting: Radiation Oncology

## 2020-01-04 ENCOUNTER — Telehealth: Payer: Self-pay | Admitting: *Deleted

## 2020-01-04 DIAGNOSIS — J9690 Respiratory failure, unspecified, unspecified whether with hypoxia or hypercapnia: Secondary | ICD-10-CM | POA: Diagnosis not present

## 2020-01-04 DIAGNOSIS — A419 Sepsis, unspecified organism: Secondary | ICD-10-CM | POA: Diagnosis not present

## 2020-01-04 NOTE — Telephone Encounter (Signed)
Jacob Henson had asked me to give a call to Jacob Henson about the patient in rehab at [facility].  I called her and got her voicemail at work and let her know that Jacob Henson had spoke with me about [facility] and does not seem like he is getting the care that Jacob Henson thinks he should have.  I said 1 thing to check with is a Education officer, museum and see if there is anything that she can do that would make his stay better and also there is a Retail buyer for every nursing home that she could talk to them about the things that she thinks should be done better or differently.  I said that Dr. Janese Henson wanted him to be seen the week of December 6 may be the sixth or the seventh.  I asked her that she could call me back and I would be glad to speak to her about anything that she needs and also that the disability form for his company was filled out and I faxed it over to his company today.  Also I called [facility] and spoke to [place] and she did add the patient on to have transportation he is coming every day Monday through Friday for radiation and so I added him on for 130 for labs on December 6 then he will have his radiation at 2 PM and then see Dr. Janese Henson at 230.

## 2020-01-05 ENCOUNTER — Ambulatory Visit: Payer: 59

## 2020-01-05 ENCOUNTER — Other Ambulatory Visit: Payer: Self-pay

## 2020-01-05 ENCOUNTER — Inpatient Hospital Stay
Admission: EM | Admit: 2020-01-05 | Discharge: 2020-01-12 | DRG: 871 | Disposition: E | Payer: 59 | Source: Skilled Nursing Facility | Attending: Internal Medicine | Admitting: Internal Medicine

## 2020-01-05 ENCOUNTER — Emergency Department: Payer: 59

## 2020-01-05 DIAGNOSIS — J9690 Respiratory failure, unspecified, unspecified whether with hypoxia or hypercapnia: Secondary | ICD-10-CM | POA: Diagnosis present

## 2020-01-05 DIAGNOSIS — R4182 Altered mental status, unspecified: Secondary | ICD-10-CM | POA: Diagnosis present

## 2020-01-05 DIAGNOSIS — Z79891 Long term (current) use of opiate analgesic: Secondary | ICD-10-CM | POA: Diagnosis not present

## 2020-01-05 DIAGNOSIS — A419 Sepsis, unspecified organism: Principal | ICD-10-CM

## 2020-01-05 DIAGNOSIS — Z20822 Contact with and (suspected) exposure to covid-19: Secondary | ICD-10-CM | POA: Diagnosis present

## 2020-01-05 DIAGNOSIS — Z515 Encounter for palliative care: Secondary | ICD-10-CM | POA: Diagnosis not present

## 2020-01-05 DIAGNOSIS — Z66 Do not resuscitate: Secondary | ICD-10-CM | POA: Diagnosis not present

## 2020-01-05 DIAGNOSIS — L8990 Pressure ulcer of unspecified site, unspecified stage: Secondary | ICD-10-CM | POA: Diagnosis present

## 2020-01-05 DIAGNOSIS — Z8249 Family history of ischemic heart disease and other diseases of the circulatory system: Secondary | ICD-10-CM | POA: Diagnosis not present

## 2020-01-05 DIAGNOSIS — J690 Pneumonitis due to inhalation of food and vomit: Secondary | ICD-10-CM

## 2020-01-05 DIAGNOSIS — Z87891 Personal history of nicotine dependence: Secondary | ICD-10-CM

## 2020-01-05 DIAGNOSIS — J9601 Acute respiratory failure with hypoxia: Principal | ICD-10-CM

## 2020-01-05 DIAGNOSIS — C7800 Secondary malignant neoplasm of unspecified lung: Secondary | ICD-10-CM | POA: Diagnosis present

## 2020-01-05 DIAGNOSIS — N179 Acute kidney failure, unspecified: Secondary | ICD-10-CM | POA: Diagnosis present

## 2020-01-05 DIAGNOSIS — C7951 Secondary malignant neoplasm of bone: Secondary | ICD-10-CM | POA: Diagnosis present

## 2020-01-05 DIAGNOSIS — Z7401 Bed confinement status: Secondary | ICD-10-CM | POA: Diagnosis not present

## 2020-01-05 DIAGNOSIS — K219 Gastro-esophageal reflux disease without esophagitis: Secondary | ICD-10-CM | POA: Diagnosis present

## 2020-01-05 DIAGNOSIS — Z931 Gastrostomy status: Secondary | ICD-10-CM

## 2020-01-05 DIAGNOSIS — Z833 Family history of diabetes mellitus: Secondary | ICD-10-CM

## 2020-01-05 DIAGNOSIS — C158 Malignant neoplasm of overlapping sites of esophagus: Secondary | ICD-10-CM | POA: Diagnosis present

## 2020-01-05 DIAGNOSIS — C7970 Secondary malignant neoplasm of unspecified adrenal gland: Secondary | ICD-10-CM | POA: Diagnosis present

## 2020-01-05 DIAGNOSIS — R6521 Severe sepsis with septic shock: Secondary | ICD-10-CM | POA: Diagnosis present

## 2020-01-05 DIAGNOSIS — C787 Secondary malignant neoplasm of liver and intrahepatic bile duct: Secondary | ICD-10-CM | POA: Diagnosis present

## 2020-01-05 DIAGNOSIS — Z79899 Other long term (current) drug therapy: Secondary | ICD-10-CM

## 2020-01-05 DIAGNOSIS — I10 Essential (primary) hypertension: Secondary | ICD-10-CM | POA: Diagnosis present

## 2020-01-05 DIAGNOSIS — Z9221 Personal history of antineoplastic chemotherapy: Secondary | ICD-10-CM

## 2020-01-05 LAB — COMPREHENSIVE METABOLIC PANEL
ALT: 45 U/L — ABNORMAL HIGH (ref 0–44)
AST: 66 U/L — ABNORMAL HIGH (ref 15–41)
Albumin: 2.5 g/dL — ABNORMAL LOW (ref 3.5–5.0)
Alkaline Phosphatase: 90 U/L (ref 38–126)
Anion gap: 14 (ref 5–15)
BUN: 72 mg/dL — ABNORMAL HIGH (ref 6–20)
CO2: 30 mmol/L (ref 22–32)
Calcium: 12.7 mg/dL — ABNORMAL HIGH (ref 8.9–10.3)
Chloride: 94 mmol/L — ABNORMAL LOW (ref 98–111)
Creatinine, Ser: 3.14 mg/dL — ABNORMAL HIGH (ref 0.61–1.24)
GFR, Estimated: 22 mL/min — ABNORMAL LOW (ref >60)
Glucose, Bld: 101 mg/dL — ABNORMAL HIGH (ref 70–99)
Potassium: 6.1 mmol/L — ABNORMAL HIGH (ref 3.5–5.1)
Sodium: 138 mmol/L (ref 135–145)
Total Bilirubin: 1 mg/dL (ref 0.3–1.2)
Total Protein: 7.3 g/dL (ref 6.5–8.1)

## 2020-01-05 LAB — CREATININE, SERUM
Creatinine, Ser: 3.06 mg/dL — ABNORMAL HIGH (ref 0.61–1.24)
GFR, Estimated: 23 mL/min — ABNORMAL LOW (ref >60)

## 2020-01-05 LAB — BASIC METABOLIC PANEL
Anion gap: 11 (ref 5–15)
BUN: 74 mg/dL — ABNORMAL HIGH (ref 6–20)
CO2: 29 mmol/L (ref 22–32)
Calcium: 11.4 mg/dL — ABNORMAL HIGH (ref 8.9–10.3)
Chloride: 97 mmol/L — ABNORMAL LOW (ref 98–111)
Creatinine, Ser: 2.85 mg/dL — ABNORMAL HIGH (ref 0.61–1.24)
GFR, Estimated: 25 mL/min — ABNORMAL LOW (ref >60)
Glucose, Bld: 104 mg/dL — ABNORMAL HIGH (ref 70–99)
Potassium: 5.2 mmol/L — ABNORMAL HIGH (ref 3.5–5.1)
Sodium: 137 mmol/L (ref 135–145)

## 2020-01-05 LAB — CBC WITH DIFFERENTIAL/PLATELET
Abs Immature Granulocytes: 0.1 10*3/uL — ABNORMAL HIGH (ref 0.00–0.07)
Basophils Absolute: 0.1 10*3/uL (ref 0.0–0.1)
Basophils Relative: 1 %
Eosinophils Absolute: 0.1 10*3/uL (ref 0.0–0.5)
Eosinophils Relative: 0 %
HCT: 37.4 % — ABNORMAL LOW (ref 39.0–52.0)
Hemoglobin: 11.3 g/dL — ABNORMAL LOW (ref 13.0–17.0)
Immature Granulocytes: 1 %
Lymphocytes Relative: 12 %
Lymphs Abs: 1.5 10*3/uL (ref 0.7–4.0)
MCH: 29.2 pg (ref 26.0–34.0)
MCHC: 30.2 g/dL (ref 30.0–36.0)
MCV: 96.6 fL (ref 80.0–100.0)
Monocytes Absolute: 1.5 10*3/uL — ABNORMAL HIGH (ref 0.1–1.0)
Monocytes Relative: 12 %
Neutro Abs: 9.2 10*3/uL — ABNORMAL HIGH (ref 1.7–7.7)
Neutrophils Relative %: 74 %
Platelets: 287 10*3/uL (ref 150–400)
RBC: 3.87 MIL/uL — ABNORMAL LOW (ref 4.22–5.81)
RDW: 16.2 % — ABNORMAL HIGH (ref 11.5–15.5)
WBC: 12.4 10*3/uL — ABNORMAL HIGH (ref 4.0–10.5)
nRBC: 0 % (ref 0.0–0.2)

## 2020-01-05 LAB — URINALYSIS, COMPLETE (UACMP) WITH MICROSCOPIC
Bilirubin Urine: NEGATIVE
Glucose, UA: NEGATIVE mg/dL
Hgb urine dipstick: NEGATIVE
Ketones, ur: NEGATIVE mg/dL
Leukocytes,Ua: NEGATIVE
Nitrite: NEGATIVE
Protein, ur: 30 mg/dL — AB
Specific Gravity, Urine: 1.018 (ref 1.005–1.030)
pH: 5 (ref 5.0–8.0)

## 2020-01-05 LAB — CBC
HCT: 30.2 % — ABNORMAL LOW (ref 39.0–52.0)
Hemoglobin: 9.1 g/dL — ABNORMAL LOW (ref 13.0–17.0)
MCH: 29.2 pg (ref 26.0–34.0)
MCHC: 30.1 g/dL (ref 30.0–36.0)
MCV: 96.8 fL (ref 80.0–100.0)
Platelets: 192 10*3/uL (ref 150–400)
RBC: 3.12 MIL/uL — ABNORMAL LOW (ref 4.22–5.81)
RDW: 15.9 % — ABNORMAL HIGH (ref 11.5–15.5)
WBC: 8.7 10*3/uL (ref 4.0–10.5)
nRBC: 0 % (ref 0.0–0.2)

## 2020-01-05 LAB — TROPONIN I (HIGH SENSITIVITY)
Troponin I (High Sensitivity): 169 ng/L (ref <18)
Troponin I (High Sensitivity): 514 ng/L (ref <18)

## 2020-01-05 LAB — RESP PANEL BY RT-PCR (FLU A&B, COVID) ARPGX2
Influenza A by PCR: NEGATIVE
Influenza B by PCR: NEGATIVE
SARS Coronavirus 2 by RT PCR: NEGATIVE

## 2020-01-05 LAB — LACTIC ACID, PLASMA
Lactic Acid, Venous: 4.4 mmol/L (ref 0.5–1.9)
Lactic Acid, Venous: 3.6 mmol/L (ref 0.5–1.9)

## 2020-01-05 LAB — PROTIME-INR
INR: 1.1 (ref 0.8–1.2)
Prothrombin Time: 14.2 seconds (ref 11.4–15.2)

## 2020-01-05 LAB — BRAIN NATRIURETIC PEPTIDE: B Natriuretic Peptide: 118 pg/mL — ABNORMAL HIGH (ref 0.0–100.0)

## 2020-01-05 LAB — MRSA PCR SCREENING: MRSA by PCR: NEGATIVE

## 2020-01-05 IMAGING — DX DG CHEST 1V PORT
1 series · 1 of 1 positions shown · non-contrast
Comparison: [DATE].

CLINICAL DATA: Sepsis.

EXAM:
PORTABLE CHEST 1 VIEW

[chest ap]
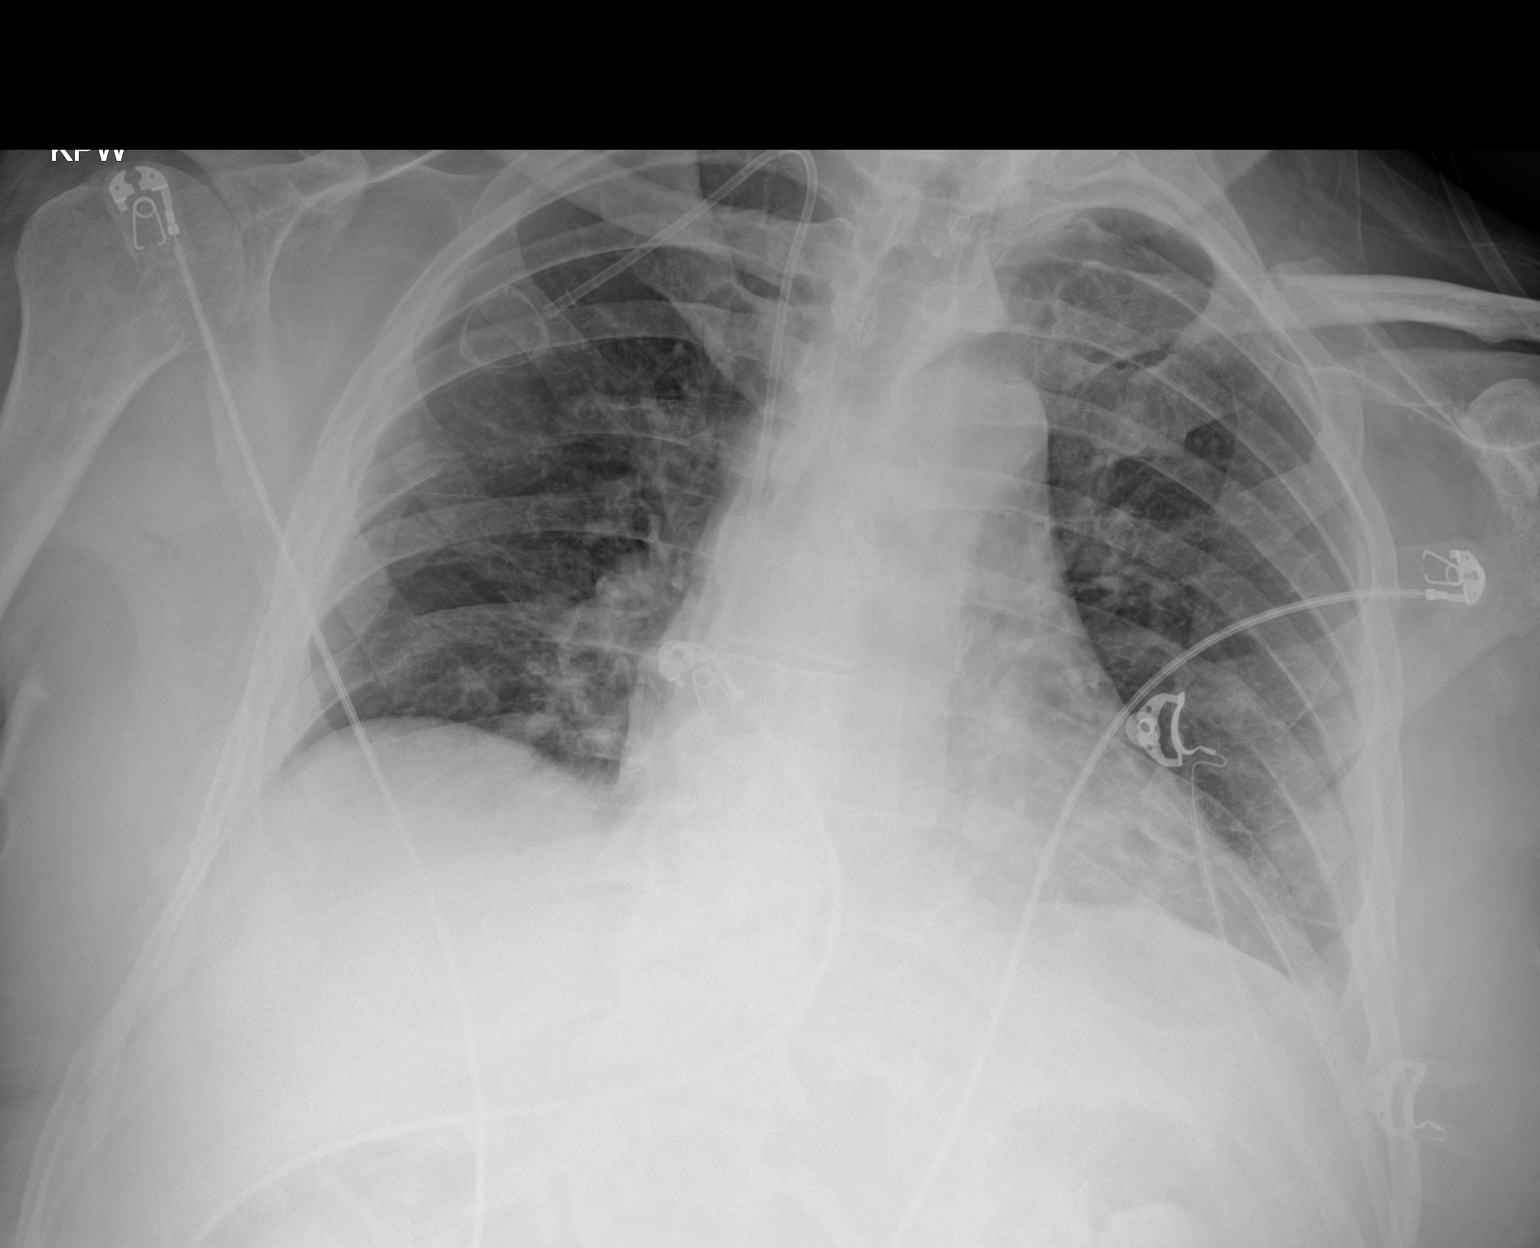

[1 of 1 positions shown; findings below may reference images not displayed]

FINDINGS: Stable cardiomediastinal silhouette. Mild bibasilar subsegmental
atelectasis or infiltrates are noted. Right internal jugular
Port-A-Cath is unchanged. Bony thorax is unremarkable.
IMPRESSION: Mild bibasilar subsegmental atelectasis or infiltrates are noted.

## 2020-01-05 MED ORDER — ACETAMINOPHEN 160 MG/5ML PO SOLN
650.0000 mg | Freq: Once | ORAL | Status: DC
  Filled 2020-01-05: qty 20.3

## 2020-01-05 MED ORDER — POLYVINYL ALCOHOL 1.4 % OP SOLN
1.0000 [drp] | Freq: Four times a day (QID) | OPHTHALMIC | Status: DC | PRN
  Filled 2020-01-05: qty 15

## 2020-01-05 MED ORDER — BIOTENE DRY MOUTH MT LIQD: 15.0000 mL | OROMUCOSAL | Status: DC | PRN

## 2020-01-05 MED ORDER — DOCUSATE SODIUM 100 MG PO CAPS: 100.0000 mg | ORAL_CAPSULE | Freq: Two times a day (BID) | ORAL | Status: DC | PRN

## 2020-01-05 MED ORDER — ENOXAPARIN SODIUM 60 MG/0.6ML ~~LOC~~ SOLN
0.5000 mg/kg | SUBCUTANEOUS | Status: DC
  Filled 2020-01-05: qty 0.6

## 2020-01-05 MED ORDER — ONDANSETRON HCL 4 MG/2ML IJ SOLN: 4.0000 mg | Freq: Four times a day (QID) | INTRAMUSCULAR | Status: DC | PRN

## 2020-01-05 MED ORDER — ENOXAPARIN SODIUM 40 MG/0.4ML ~~LOC~~ SOLN: 40.0000 mg | SUBCUTANEOUS | Status: DC

## 2020-01-05 MED ORDER — VANCOMYCIN HCL IN DEXTROSE 1-5 GM/200ML-% IV SOLN
1000.0000 mg | Freq: Once | INTRAVENOUS | Status: DC
  Filled 2020-01-05: qty 200

## 2020-01-05 MED ORDER — SODIUM CHLORIDE 0.9 % IV BOLUS
1000.0000 mL | Freq: Once | INTRAVENOUS | Status: AC
  Administered 2020-01-05: 1000 mL via INTRAVENOUS

## 2020-01-05 MED ORDER — DEXTROSE 50 % IV SOLN
1.0000 | Freq: Once | INTRAVENOUS | Status: AC
  Administered 2020-01-05: 50 mL via INTRAVENOUS
  Filled 2020-01-05: qty 50

## 2020-01-05 MED ORDER — SODIUM CHLORIDE 0.9% FLUSH
3.0000 mL | Freq: Two times a day (BID) | INTRAVENOUS | Status: DC
  Administered 2020-01-05: 3 mL via INTRAVENOUS

## 2020-01-05 MED ORDER — SODIUM CHLORIDE 0.9 % IV SOLN
2.0000 g | Freq: Once | INTRAVENOUS | Status: AC
  Administered 2020-01-05: 2 g via INTRAVENOUS
  Filled 2020-01-05: qty 2

## 2020-01-05 MED ORDER — NOREPINEPHRINE 4 MG/250ML-% IV SOLN
0.0000 ug/min | INTRAVENOUS | Status: DC
  Administered 2020-01-05: 30 ug/min via INTRAVENOUS
  Administered 2020-01-05: 2 ug/min via INTRAVENOUS
  Administered 2020-01-05: 30 ug/min via INTRAVENOUS
  Filled 2020-01-05 (×3): qty 250

## 2020-01-05 MED ORDER — CALCIUM GLUCONATE 10 % IV SOLN
1.0000 g | Freq: Once | INTRAVENOUS | Status: AC
  Administered 2020-01-05: 1 g via INTRAVENOUS
  Filled 2020-01-05: qty 10

## 2020-01-05 MED ORDER — ACETAMINOPHEN 325 MG PO TABS
650.0000 mg | ORAL_TABLET | ORAL | Status: DC | PRN
  Administered 2020-01-05: 650 mg via ORAL
  Filled 2020-01-05: qty 2

## 2020-01-05 MED ORDER — POLYETHYLENE GLYCOL 3350 17 G PO PACK: 17.0000 g | PACK | Freq: Every day | ORAL | Status: DC | PRN

## 2020-01-05 MED ORDER — MORPHINE SULFATE (PF) 2 MG/ML IV SOLN
2.0000 mg | Freq: Once | INTRAVENOUS | Status: AC
  Administered 2020-01-05: 2 mg via INTRAVENOUS

## 2020-01-05 MED ORDER — GLYCOPYRROLATE 0.2 MG/ML IJ SOLN: 0.2000 mg | INTRAMUSCULAR | Status: DC | PRN

## 2020-01-05 MED ORDER — LORAZEPAM 2 MG/ML IJ SOLN: 1.0000 mg | INTRAMUSCULAR | Status: DC | PRN

## 2020-01-05 MED ORDER — LACTATED RINGERS IV BOLUS (SEPSIS)
1000.0000 mL | Freq: Once | INTRAVENOUS | Status: AC
  Administered 2020-01-05: 1000 mL via INTRAVENOUS

## 2020-01-05 MED ORDER — VANCOMYCIN HCL IN DEXTROSE 1-5 GM/200ML-% IV SOLN
1000.0000 mg | Freq: Once | INTRAVENOUS | Status: AC
  Administered 2020-01-05: 1000 mg via INTRAVENOUS

## 2020-01-05 MED ORDER — SODIUM CHLORIDE 0.9% FLUSH: 3.0000 mL | INTRAVENOUS | Status: DC | PRN

## 2020-01-05 MED ORDER — ACETAMINOPHEN 650 MG RE SUPP
650.0000 mg | Freq: Once | RECTAL | Status: DC
  Filled 2020-01-05: qty 1

## 2020-01-05 MED ORDER — LACTATED RINGERS IV SOLN: INTRAVENOUS | Status: DC

## 2020-01-05 MED ORDER — LACTATED RINGERS IV BOLUS (SEPSIS): 1000.0000 mL | Freq: Once | INTRAVENOUS | Status: DC

## 2020-01-05 MED ORDER — MORPHINE 100MG IN NS 100ML (1MG/ML) PREMIX INFUSION
1.0000 mg/h | INTRAVENOUS | Status: DC
  Administered 2020-01-05: 2 mg/h via INTRAVENOUS
  Filled 2020-01-05: qty 100

## 2020-01-05 MED ORDER — SODIUM CHLORIDE 0.9 % IV SOLN: 250.0000 mL | INTRAVENOUS | Status: DC | PRN

## 2020-01-05 MED ORDER — FAMOTIDINE IN NACL 20-0.9 MG/50ML-% IV SOLN: 20.0000 mg | INTRAVENOUS | Status: DC

## 2020-01-05 MED ORDER — FAMOTIDINE IN NACL 20-0.9 MG/50ML-% IV SOLN: 20.0000 mg | Freq: Two times a day (BID) | INTRAVENOUS | Status: DC

## 2020-01-05 MED ORDER — MORPHINE SULFATE (PF) 2 MG/ML IV SOLN
2.0000 mg | INTRAVENOUS | Status: DC | PRN
  Administered 2020-01-05 (×7): 2 mg via INTRAVENOUS
  Filled 2020-01-05 (×7): qty 1

## 2020-01-05 MED ORDER — MORPHINE SULFATE (PF) 2 MG/ML IV SOLN
INTRAVENOUS | Status: AC
  Filled 2020-01-05: qty 1

## 2020-01-05 MED ORDER — VANCOMYCIN HCL 1500 MG/300ML IV SOLN
1500.0000 mg | Freq: Once | INTRAVENOUS | Status: AC
  Administered 2020-01-05: 1500 mg via INTRAVENOUS
  Filled 2020-01-05: qty 300

## 2020-01-05 MED ORDER — MORPHINE BOLUS VIA INFUSION
1.0000 mg | INTRAVENOUS | Status: DC | PRN
  Filled 2020-01-05: qty 2

## 2020-01-05 MED ORDER — INSULIN ASPART 100 UNIT/ML ~~LOC~~ SOLN
10.0000 [IU] | Freq: Once | SUBCUTANEOUS | Status: AC
  Administered 2020-01-05: 10 [IU] via INTRAVENOUS
  Filled 2020-01-05: qty 1

## 2020-01-05 MED ORDER — LACTATED RINGERS IV BOLUS (SEPSIS)
500.0000 mL | Freq: Once | INTRAVENOUS | Status: AC
  Administered 2020-01-05: 500 mL via INTRAVENOUS

## 2020-01-05 MED ORDER — SODIUM CHLORIDE 0.9 % IV SOLN
3.0000 g | Freq: Four times a day (QID) | INTRAVENOUS | Status: DC
  Administered 2020-01-05: 3 g via INTRAVENOUS
  Filled 2020-01-05 (×3): qty 8
  Filled 2020-01-05: qty 3

## 2020-01-06 LAB — URINE CULTURE: Culture: NO GROWTH

## 2020-01-10 ENCOUNTER — Ambulatory Visit: Payer: 59

## 2020-01-10 LAB — CULTURE, BLOOD (ROUTINE X 2)
Culture: NO GROWTH
Culture: NO GROWTH
Special Requests: ADEQUATE

## 2020-01-11 ENCOUNTER — Ambulatory Visit: Payer: 59

## 2020-01-12 ENCOUNTER — Ambulatory Visit: Payer: 59

## 2020-01-12 NOTE — ED Notes (Signed)
Date and time results received: 01/09/20  (use smartphrase ".now" to insert current time)  Test: Troponin Critical Value: 169  Name of Provider Notified: Siadecki  Orders Received? Or Actions Taken?:

## 2020-01-12 NOTE — Progress Notes (Signed)
Spoke with pts wife at this time they are waiting on additional family members to arrive at bedside.  However, she does want the pt to remain comfortable and not suffer.  Will continue levophed gtt for now until all family members arrive at bedside, and continue prn morphine along with HFNC for comfort.  Will continue to monitor and assess pt.  Marda Stalker, [place] Pager [phone] (please enter 7 digits) PCCM Consult Pager [phone] (please enter 7 digits)

## 2020-01-12 NOTE — ED Triage Notes (Signed)
Pt arrived via EMS from [facility] with c/o of unresponsiveness.

## 2020-01-12 NOTE — Death Summary Note (Signed)
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 year old male who presented to [facility] ER on 02/01/2020 with acute renal failure, acute respiratory failure secondary to aspiration pneumonia, complicated by stage IV metastatic esophageal cancer which he was previously receiving active XRT/chemotherapy along with immunotherapy.  Upon arrival to the ER pt hypotensive and hypoxic requiring levophed gtt and HFNC.  He was subsequently admitted to ICU for additional workup and treatment.  Upon arrival to ICU critical care and palliative care teams discussed code status and goals of treatment with pts wife.  Due to pts continued decline pts wife changed pts code status to DNR/DNI on 02-01-2020.  Once additional family members arrived at beside pts wife decided to transition pt to comfort measures only on Feb 01, 2020.  Pt expired on 02-01-20 at 05/29/2233.   Pertinent Labs and Studies  Significant Diagnostic Studies MR BRAIN W WO CONTRAST  Result Date: 12/20/2019 CLINICAL DATA:  Initial evaluation for intractable nausea, history of stage IV esophageal cancer.  Evaluate for metastatic disease. EXAM: MRI HEAD WITHOUT AND WITH CONTRAST TECHNIQUE: Multiplanar, multiecho pulse sequences of the brain and surrounding structures were obtained without and with intravenous contrast. CONTRAST:  28mL GADAVIST GADOBUTROL 1 MMOL/ML IV SOLN COMPARISON:  None available. FINDINGS: Brain: Cerebral volume within normal limits for age. Few scattered foci of subcentimeter T2/FLAIR hyperintensity noted within the periventricular deep white matter both cerebral hemispheres, nonspecific, but most like related chronic microvascular ischemic disease, mild for age. Superimposed remote lacunar infarcts noted within the left thalamus and basal ganglia. No abnormal foci of restricted diffusion to suggest acute or subacute ischemia. Gray-white matter differentiation maintained. No encephalomalacia to suggest chronic cortical infarction elsewhere within the brain. No acute intracranial hemorrhage. Few punctate chronic micro hemorrhages noted within the cerebellum, brainstem, and about the deep gray nuclei, most likely related to chronic underlying hypertension. No mass lesion, midline shift or mass effect. No abnormal enhancement or evidence for intracranial metastatic disease. Ventricles normal size without hydrocephalus. No extra-axial fluid collection. Pituitary gland suprasellar region within normal limits. Midline structures intact. Vascular: Major intracranial vascular flow voids are maintained. Skull and upper cervical spine: Craniocervical junction within normal limits. Bone marrow signal intensity normal. No visible focal marrow replacing lesion. No scalp soft tissue abnormality. Sinuses/Orbits: Globes orbital soft tissues within normal limits. Paranasal sinuses are largely clear. No mastoid effusion. Inner ear structures within normal limits. Other: None. IMPRESSION: 1. No acute intracranial abnormality or evidence for intracranial metastatic disease. 2. Mild chronic microvascular ischemic  disease with superimposed remote lacunar infarcts involving the left thalamus and basal ganglia. 3. Few punctate chronic micro hemorrhages involving the cerebellum, brainstem, and  deep gray nuclei, most likely related to chronic underlying hypertension. Electronically Signed   By: Jeannine Boga M.D.   On: 12/20/2019 22:55   DG Chest Port 1 View  Result Date: 22-Jan-2020 CLINICAL DATA:  Sepsis. EXAM: PORTABLE CHEST 1 VIEW COMPARISON:  December 19, 2019. FINDINGS: Stable cardiomediastinal silhouette. Mild bibasilar subsegmental atelectasis or infiltrates are noted. Right internal jugular Port-A-Cath is unchanged. Bony thorax is unremarkable. IMPRESSION: Mild bibasilar subsegmental atelectasis or infiltrates are noted. Electronically Signed   By: Marijo Conception M.D.   On: 2020/01/22 09:37   DG Chest Portable 1 View  Result Date: 12/19/2019 CLINICAL DATA:  Weakness. EXAM: PORTABLE CHEST 1 VIEW COMPARISON:  October 10, 2018.  PET-CT dated 11/23/2019 FINDINGS: There is a well-positioned right-sided Port-A-Cath. Again noted is a lytic lesion involving the right glenoid. There is no pneumothorax. No large pleural effusion. There is some atelectasis at the lung bases. There is a lytic lesion involving the posterior eighth rib on the right. The heart size is unremarkable. IMPRESSION: 1. No acute cardiopulmonary process. 2. Well-positioned right-sided Port-A-Cath. 3. Lytic lesion involving the right glenoid and right eighth rib. Electronically Signed   By: Constance Holster M.D.   On: 12/19/2019 02:37   DG Abd 2 Views  Result Date: 12/19/2019 CLINICAL DATA:  Generalized weakness EXAM: ABDOMEN - 2 VIEW COMPARISON:  None. FINDINGS: The bowel gas pattern is normal. Moderate amount of right colonic stool is present. There is no evidence of free air. No radio-opaque calculi or other significant radiographic abnormality is seen. IMPRESSION: Nonobstructive bowel gas pattern. Electronically Signed   By: Prudencio Pair  M.D.   On: 12/19/2019 16:30   DG HIP UNILAT WITH PELVIS 2-3 VIEWS LEFT  Result Date: 12/23/2019 CLINICAL DATA:  Bilateral hip pain, esophageal cancer with osseous metastatic disease EXAM: DG HIP (WITH OR WITHOUT PELVIS) 2-3V LEFT; DG HIP (WITH OR WITHOUT PELVIS) 2-3V RIGHT COMPARISON:  PET-CT, 11/23/2019 FINDINGS: No fracture or dislocation of the bilateral hips. There is a large, lytic lesion of the left ilium, better characterized by prior PET-CT. There are no lesions of the bilateral proximal femurs appreciated radiographically. Mild superior joint space height loss and acetabular osteophytosis of the bilateral hips. Enthesopathic change about the iliac crests. IMPRESSION: 1. No fracture or dislocation of the bilateral hips. Mild arthrosis. 2. There is a large, lytic lesion of the left ilium, better characterized by prior PET-CT. There are no lesions of the bilateral proximal femurs appreciated radiographically. Contrast enhanced MRI is the test of choice to assess for osseous metastatic lesions. Electronically Signed   By: Eddie Candle M.D.   On: 12/23/2019 18:57   DG HIP UNILAT WITH PELVIS 2-3 VIEWS RIGHT  Result Date: 12/23/2019 CLINICAL DATA:  Bilateral hip pain, esophageal cancer with osseous metastatic disease EXAM: DG HIP (WITH OR WITHOUT PELVIS) 2-3V LEFT; DG HIP (WITH OR WITHOUT PELVIS) 2-3V RIGHT COMPARISON:  PET-CT, 11/23/2019 FINDINGS: No fracture or dislocation of the bilateral hips. There is a large, lytic lesion of the left ilium, better characterized by prior PET-CT. There are no lesions of the bilateral proximal femurs appreciated radiographically. Mild superior joint space height loss and acetabular osteophytosis of the bilateral hips. Enthesopathic change about the iliac crests. IMPRESSION: 1. No fracture or dislocation of the bilateral hips. Mild arthrosis. 2. There is a large, lytic lesion of the left ilium, better characterized by prior PET-CT. There are no lesions of the bilateral  proximal femurs appreciated radiographically. Contrast enhanced MRI is the test  of choice to assess for osseous metastatic lesions. Electronically Signed   By: Eddie Candle M.D.   On: 12/23/2019 18:57    Microbiology Recent Results (from the past 240 hour(s))  SARS Coronavirus 2 by RT PCR (hospital order, performed in [facility] hospital lab) Nasopharyngeal Nasopharyngeal Swab     Status: None   Collection Time: 12/27/19 11:54 AM   Specimen: Nasopharyngeal Swab  Result Value Ref Range Status   SARS Coronavirus 2 NEGATIVE NEGATIVE Final    Comment: (NOTE) SARS-CoV-2 target nucleic acids are NOT DETECTED.  The SARS-CoV-2 RNA is generally detectable in upper and lower respiratory specimens during the acute phase of infection. The lowest concentration of SARS-CoV-2 viral copies this assay can detect is 250 copies / mL. A negative result does not preclude SARS-CoV-2 infection and should not be used as the sole basis for treatment or other patient management decisions.  A negative result may occur with improper specimen collection / handling, submission of specimen other than nasopharyngeal swab, presence of viral mutation(s) within the areas targeted by this assay, and inadequate number of viral copies (<250 copies / mL). A negative result must be combined with clinical observations, patient history, and epidemiological information.  Fact Sheet for Patients:   StrictlyIdeas.no  Fact Sheet for Healthcare Providers: BankingDealers.co.za  This test is not yet approved or  cleared by the [place] FDA and has been authorized for detection and/or diagnosis of SARS-CoV-2 by FDA under an Emergency Use Authorization (EUA).  This EUA will remain in effect (meaning this test can be used) for the duration of the COVID-19 declaration under Section 564(b)(1) of the Act, 21 U.S.C. section 360bbb-3(b)(1), unless the authorization is terminated  or revoked sooner.  Performed at [facility] [address] [place], [place] [address]   Resp Panel by RT-PCR (Flu A&B, Covid) Nasopharyngeal Swab     Status: None   Collection Time: 01-13-20  8:46 AM   Specimen: Nasopharyngeal Swab; Nasopharyngeal(NP) swabs in vial transport medium  Result Value Ref Range Status   SARS Coronavirus 2 by RT PCR NEGATIVE NEGATIVE Final    Comment: (NOTE) SARS-CoV-2 target nucleic acids are NOT DETECTED.  The SARS-CoV-2 RNA is generally detectable in upper respiratory specimens during the acute phase of infection. The lowest concentration of SARS-CoV-2 viral copies this assay can detect is 138 copies/mL. A negative result does not preclude SARS-Cov-2 infection and should not be used as the sole basis for treatment or other patient management decisions. A negative result may occur with  improper specimen collection/handling, submission of specimen other than nasopharyngeal swab, presence of viral mutation(s) within the areas targeted by this assay, and inadequate number of viral copies(<138 copies/mL). A negative result must be combined with clinical observations, patient history, and epidemiological information. The expected result is Negative.  Fact Sheet for Patients:  EntrepreneurPulse.com.au  Fact Sheet for Healthcare Providers:  IncredibleEmployment.be  This test is no t yet approved or cleared by the [place] FDA and  has been authorized for detection and/or diagnosis of SARS-CoV-2 by FDA under an Emergency Use Authorization (EUA). This EUA will remain  in effect (meaning this test can be used) for the duration of the COVID-19 declaration under Section 564(b)(1) of the Act, 21 U.S.C.section 360bbb-3(b)(1), unless the authorization is terminated  or revoked sooner.       Influenza A by PCR NEGATIVE NEGATIVE Final   Influenza B by PCR NEGATIVE NEGATIVE Final    Comment: (NOTE) The  Xpert Xpress  SARS-CoV-2/FLU/RSV plus assay is intended as an aid in the diagnosis of influenza from Nasopharyngeal swab specimens and should not be used as a sole basis for treatment. Nasal washings and aspirates are unacceptable for Xpert Xpress SARS-CoV-2/FLU/RSV testing.  Fact Sheet for Patients: EntrepreneurPulse.com.au  Fact Sheet for Healthcare Providers: IncredibleEmployment.be  This test is not yet approved or cleared by the [place] FDA and has been authorized for detection and/or diagnosis of SARS-CoV-2 by FDA under an Emergency Use Authorization (EUA). This EUA will remain in effect (meaning this test can be used) for the duration of the COVID-19 declaration under Section 564(b)(1) of the Act, 21 U.S.C. section 360bbb-3(b)(1), unless the authorization is terminated or revoked.  Performed at [facility], [address] [place], [place] [address]   MRSA PCR Screening     Status: None   Collection Time: January 17, 2020  3:29 PM   Specimen: Nasal Mucosa; Nasopharyngeal  Result Value Ref Range Status   MRSA by PCR NEGATIVE NEGATIVE Final    Comment:        The GeneXpert MRSA Assay (FDA approved for NASAL specimens only), is one component of a comprehensive MRSA colonization surveillance program. It is not intended to diagnose MRSA infection nor to guide or monitor treatment for MRSA infections. Performed at [facility], [address] [place], [place] [address]     Lab Basic Metabolic Panel: Recent Labs  Lab January 17, 2020 0846 2020/01/17 1144 Jan 17, 2020 1418  NA 138  --  137  K 6.1*  --  5.2*  CL 94*  --  97*  CO2 30  --  29  GLUCOSE 101*  --  104*  BUN 72*  --  74*  CREATININE 3.14* 3.06* 2.85*  CALCIUM 12.7*  --  11.4*   Liver Function Tests: Recent Labs  Lab 2020/01/17 0846  AST 66*  ALT 45*  ALKPHOS 90  BILITOT 1.0  PROT 7.3  ALBUMIN 2.5*   No results for input(s): LIPASE, AMYLASE in the last  168 hours. No results for input(s): AMMONIA in the last 168 hours. CBC: Recent Labs  Lab 01-17-2020 0846 01-17-2020 1144  WBC 12.4* 8.7  NEUTROABS 9.2*  --   HGB 11.3* 9.1*  HCT 37.4* 30.2*  MCV 96.6 96.8  PLT 287 192   Cardiac Enzymes: No results for input(s): CKTOTAL, CKMB, CKMBINDEX, TROPONINI in the last 168 hours. Sepsis Labs: Recent Labs  Lab Jan 17, 2020 0846 01/17/20 1144  WBC 12.4* 8.7  LATICACIDVEN 4.4* 3.6*    Procedures/Operations  None  Marda Stalker, [place] Pager [phone] (please enter 7 digits) PCCM Consult Pager [phone] (please enter 7 digits)

## 2020-01-12 NOTE — Telephone Encounter (Signed)
I called Jacob Henson and left her a message stating that I sent the forms into the company on the forms fax number. If she needs a copy I can print one off and she can come by and get copy or I can fax it to her. I left my number for her to call me back

## 2020-01-12 NOTE — Progress Notes (Signed)
Morphine administered prn for SOB and labored breathing. Family at bedside.

## 2020-01-12 NOTE — ED Notes (Signed)
Date and time results received: 01/24/20  (use smartphrase ".now" to insert current time)  Test: lactic Critical Value: 4.4  Name of Provider Notified: Siadecki  Orders Received? Or Actions Taken?:

## 2020-01-12 NOTE — Consult Note (Signed)
PHARMACY -  BRIEF ANTIBIOTIC NOTE   Pharmacy has received consult(s) for Pneumonia from an ED provider.  The patient's profile has been reviewed for ht/wt/allergies/indication/available labs.    One time order(s) placed for Cefepime & Vancomycin  Further antibiotics/pharmacy consults should be ordered by admitting physician if indicated.                       Thank you, Lorna Dibble 01/25/20  8:49 AM

## 2020-01-12 NOTE — ED Provider Notes (Signed)
[facility] Emergency Department Provider Note ____________________________________________   First MD Initiated Contact with Patient January 06, 2020 [phone]     (approximate)  I have reviewed the triage vital signs and the nursing notes.   HISTORY  Chief Complaint Unrepsonsive  Level 5 caveat: History of present illness limited altered mental status and respiratory distress  HPI Jacob KIRSCH is a 59 y.o. male with PMH as noted below including stage IV metastatic esophageal, GERD, and hypertension who presents from a rehab facility with altered mental status and respiratory distress.  The patient has been receiving tube feeds.  EMS reports that facility staff were concerned for possible aspiration.  The wife states that the patient has been progressively more weak and confused over the last few days.  She talked to him last night and he was confused but alert.  He did not have any respiratory distress at that time.  The patient himself is unable to give any history.  Per EMS, O2 saturation was in the 50s on room air on their arrival.  Past Medical History:  Diagnosis Date  . Allergy   . Cancer ([place])   . Esophageal cancer ([place])   . GERD (gastroesophageal reflux disease)    Has required esophageal dilation in past  . Hypertension     Patient Active Problem List   Diagnosis Date Noted  . Malnutrition of moderate degree 12/23/2019  . Palliative care encounter   . Weakness 12/19/2019  . AKI (acute kidney injury) ([place])   . Nausea and vomiting   . Malignant neoplasm of overlapping sites of esophagus ([place])   . Anemia   . Thrombocytopenia ([place])   . Neutropenia ([place])   . Palliative care patient 11/30/2019  . Goals of care, counseling/discussion 11/15/2019  . 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 Mass of joint of right shoulder 10/28/2019  . Essential hypertension 10/28/2019  . GERD (gastroesophageal reflux disease) 10/28/2019    Past Surgical  History:  Procedure Laterality Date  . ESOPHAGEAL DILATION    . PEG TUBE PLACEMENT  11/30/2019  . PORTA CATH INSERTION N/A 11/19/2019   Procedure: PORTA CATH INSERTION;  Surgeon: Algernon Huxley, MD;  Location: [place] [facility];  Service: Cardiovascular;  Laterality: N/A;    Prior to Admission medications   Medication Sig Start Date End Date Taking? Authorizing Provider  acetaminophen (TYLENOL) 325 MG tablet Take 2 tablets (650 mg total) by mouth every 6 (six) hours as needed for mild pain (or Fever >/= 101). Patient taking differently: Place 650 mg into feeding tube every 6 (six) hours as needed for mild pain (or Fever >/= 101).  12/27/19  Yes Pahwani, Rinka R, MD  fentaNYL (DURAGESIC) 12 MCG/HR Place 1 patch onto the skin every 3 (three) days. 12/27/19  Yes Pahwani, Rinka R, MD  lidocaine-prilocaine (EMLA) cream Apply to affected area once Patient taking differently: Apply 1 application topically once as needed (port access). Apply to port 1 hour prior to chemo tx 11/18/19  Yes Sindy Guadeloupe, MD  magic mouthwash SOLN Take 10 mLs by mouth every 6 (six) hours. Swish and spit   Yes [provider]  magnesium hydroxide (MILK OF MAGNESIA) 400 MG/5ML suspension Take 30 mLs by mouth daily as needed for mild constipation. Patient taking differently: Place 30 mLs into feeding tube daily as needed for mild constipation.  12/27/19  Yes Pahwani, Rinka R, MD  metoCLOPramide (REGLAN) 5 MG/5ML solution Take 10 mLs (10 mg total) by mouth 3 (  three) times daily before meals. Patient taking differently: Place 10 mg into feeding tube 3 (three) times daily before meals.  12/27/19  Yes Pahwani, Rinka R, MD  metoprolol tartrate (LOPRESSOR) 25 MG tablet Take 0.5 tablets (12.5 mg total) by mouth daily. Patient taking differently: Place 12.5 mg into feeding tube daily.  12/28/19  Yes Pahwani, Michell Heinrich, MD  Nutritional Supplements (FEEDING SUPPLEMENT, PROSOURCE TF,) liquid Place 45 mLs into feeding tube 4  (four) times daily. 12/27/19  Yes Pahwani, Rinka R, MD  OLANZapine (ZYPREXA) 10 MG tablet Take 1 tablet (10 mg total) by mouth at bedtime. Patient taking differently: Place 10 mg into feeding tube at bedtime.  11/15/19  Yes Sindy Guadeloupe, MD  ondansetron (ZOFRAN-ODT) 8 MG disintegrating tablet Take 1 tablet (8 mg total) by mouth every 8 (eight) hours as needed for nausea or vomiting. Patient taking differently: Place 8 mg into feeding tube every 8 (eight) hours as needed for nausea or vomiting.  11/22/19  Yes Sindy Guadeloupe, MD  oxyCODONE (ROXICODONE) 5 MG/5ML solution Take 5 mLs (5 mg total) by mouth every 4 (four) hours as needed for severe pain. Patient taking differently: Place 5 mg into feeding tube every 4 (four) hours as needed for severe pain.  12/27/19  Yes Pahwani, Rinka R, MD  pantoprazole sodium (PROTONIX) 40 mg/20 mL PACK Place 40 mg into feeding tube 2 (two) times daily.  10/13/19  Yes [provider]  sennosides (SENOKOT) 8.8 MG/5ML syrup Take 10 mLs by mouth at bedtime. Through peg tube, and hold if diarrhea. Patient taking differently: Place 10 mLs into feeding tube at bedtime. hold if diarrhea. 12/07/19  Yes Sindy Guadeloupe, MD  sucralfate (CARAFATE) 1 GM/10ML suspension Take 1 g by mouth 4 (four) times daily as needed (stomach pain).  10/29/19  Yes [provider]  traZODone (DESYREL) 50 MG tablet Take 0.5 tablets (25 mg total) by mouth at bedtime as needed for sleep. Patient taking differently: Place 25 mg into feeding tube at bedtime as needed for sleep.  12/27/19  Yes Pahwani, Rinka R, MD  zinc oxide (BALMEX) 11.3 % CREA cream Apply 1 application topically as directed. Apply to sacrum TID and after each brief change   Yes [provider]  calcium carbonate (TUMS - DOSED IN MG ELEMENTAL CALCIUM) 500 MG chewable tablet Chew 1 tablet (200 mg of elemental calcium total) by mouth 2 (two) times daily as needed for indigestion or heartburn. Patient not taking:  Reported on 12-Jan-2020 12/27/19   Mckinley Jewel, MD    Allergies Patient has no known allergies.  Family History  Problem Relation Age of Onset  . Heart disease Father   . Hearing loss Father   . Diabetes Father   . Heart disease Paternal Grandmother   . Diabetes Paternal Grandmother   . Heart attack Paternal Grandfather     Social History Social History   Tobacco Use  . Smoking status: Former Smoker    Packs/day: 1.00    Years: 4.00    Pack years: 4.00    Types: Cigarettes    Quit date: 10/14/1978    Years since quitting: 41.2  . Smokeless tobacco: Never Used  Vaping Use  . Vaping Use: Never used  Substance Use Topics  . Alcohol use: No  . Drug use: Never    Review of Systems Level 5 caveat: Unable to obtain review of systems due to altered mental status and respiratory distress   ____________________________________________  PHYSICAL EXAM:  VITAL SIGNS: ED Triage Vitals  Enc Vitals Group     BP 01-12-20 0835 (!) 77/51     Pulse Rate January 12, 2020 0835 (!) 139     Resp Jan 12, 2020 0835 (!) 33     Temp January 12, 2020 0835 (!) 100.5 F (38.1 C)     Temp Source 12-Jan-2020 0835 Axillary     SpO2 01-12-20 0835 98 %     Weight 2020/01/12 0841 262 lb (118.8 kg)     Height --      Head Circumference --      Peak Flow --      Pain Score --      Pain Loc --      Pain Edu? --      Excl. in [place]? --     Constitutional: Somnolent, minimal response to painful stimuli. Eyes: Conjunctivae are normal.  EOMI.  PERRLA. Head: Atraumatic. Nose: No congestion/rhinnorhea. Mouth/Throat: Mucous membranes are dry.   Neck: Normal range of motion.  Cardiovascular: Tachycardic, regular rhythm. Grossly normal heart sounds.  Good peripheral circulation. Respiratory: Tachypnea and increased respiratory effort with accessory muscle use.  Diffuse rhonchi bilaterally. Gastrointestinal: Soft and nontender. No distention.  Genitourinary: No flank tenderness. Musculoskeletal: No lower extremity  edema.  Extremities warm and well perfused.  Neurologic: Unable to assess due to mental status. Skin:  Skin is warm and dry. No rash noted. Psychiatric: Unable to assess due to mental status.  ____________________________________________   LABS (all labs ordered are listed, but only abnormal results are displayed)  Labs Reviewed  LACTIC ACID, PLASMA - Abnormal; Notable for the following components:      Result Value   Lactic Acid, Venous 4.4 (*)    All other components within normal limits  COMPREHENSIVE METABOLIC PANEL - Abnormal; Notable for the following components:   Potassium 6.1 (*)    Chloride 94 (*)    Glucose, Bld 101 (*)    BUN 72 (*)    Creatinine, Ser 3.14 (*)    Calcium 12.7 (*)    Albumin 2.5 (*)    AST 66 (*)    ALT 45 (*)    GFR, Estimated 22 (*)    All other components within normal limits  CBC WITH DIFFERENTIAL/PLATELET - Abnormal; Notable for the following components:   WBC 12.4 (*)    RBC 3.87 (*)    Hemoglobin 11.3 (*)    HCT 37.4 (*)    RDW 16.2 (*)    Neutro Abs 9.2 (*)    Monocytes Absolute 1.5 (*)    Abs Immature Granulocytes 0.10 (*)    All other components within normal limits  BRAIN NATRIURETIC PEPTIDE - Abnormal; Notable for the following components:   B Natriuretic Peptide 118.0 (*)    All other components within normal limits  TROPONIN I (HIGH SENSITIVITY) - Abnormal; Notable for the following components:   Troponin I (High Sensitivity) 169 (*)    All other components within normal limits  RESP PANEL BY RT-PCR (FLU A&B, COVID) ARPGX2  CULTURE, BLOOD (ROUTINE X 2)  CULTURE, BLOOD (ROUTINE X 2)  URINE CULTURE  MRSA PCR SCREENING  PROTIME-INR  LACTIC ACID, PLASMA  URINALYSIS, COMPLETE (UACMP) WITH MICROSCOPIC  TROPONIN I (HIGH SENSITIVITY)   ____________________________________________  EKG  ED ECG REPORT I, Arta Silence, the attending physician, personally viewed and interpreted this ECG.  Date: Jan 12, 2020 EKG Time:  0837 Rate: 134 Rhythm: Sinus tachycardia QRS Axis: normal Intervals: normal ST/T Wave abnormalities: normal Narrative Interpretation:  no evidence of acute ischemia  ____________________________________________  RADIOLOGY  CXR interpreted by me shows bibasilar opacities, possible infiltrates  ____________________________________________   PROCEDURES  Procedure(s) performed: No  Procedures  Critical Care performed: Yes  CRITICAL CARE Performed by: Arta Silence   Total critical care time: 45 minutes  Critical care time was exclusive of separately billable procedures and treating other patients.  Critical care was necessary to treat or prevent imminent or life-threatening deterioration.  Critical care was time spent personally by me on the following activities: development of treatment plan with patient and/or surrogate as well as nursing, discussions with consultants, evaluation of patient's response to treatment, examination of patient, obtaining history from patient or surrogate, ordering and performing treatments and interventions, ordering and review of laboratory studies, ordering and review of radiographic studies, pulse oximetry and re-evaluation of patient's condition. ____________________________________________   INITIAL IMPRESSION / ASSESSMENT AND PLAN / ED COURSE  Pertinent labs & imaging results that were available during my care of the patient were reviewed by me and considered in my medical decision making (see chart for details).  58 year old male with PMH as noted above including metastatic esophageal cancer, GERD, and hypertension presents from a rehab facility with relatively acute onset respiratory distress and hypoxia.  Per the wife, the patient has been progressively more confused over the last week as well.  He is receiving tube feeds and there is suspicion for possible aspiration.  I reviewed the past medical records in Epic; the patient was  admitted earlier this month after presenting to the ED with acute progressive weakness.  He was found to have AKI and hyponatremia.  He is currently undergoing active chemotherapy and radiation.  On his most recent discharge summary, he is listed as full code.  On exam, the patient is somnolent and minimally responsive.  O2 saturation is in the low 90s on 15 L by nonrebreather.  The patient is hypotensive, tachycardic, and febrile.  He has increased work of breathing coarse rhonchi to bilateral lungs.  Mucous membranes are dry.  Overall presentation is most consistent with sepsis, likely due to pulmonary source.  Differential includes sepsis due to other infection, aspiration pneumonitis, metabolic abnormality.  I do not suspect acute PE given the fever and abnormal breath sounds.  We will give fluids and antibiotics per sepsis protocol, obtain lab work-up, keep patient on oxygen, and plan for admission.  I had an extensive discussion with the wife about his current condition and prognosis as well as his CODE STATUS.  The wife confirms that they recently revise his living will.  Per the wife, he would want intubation and CPR if there was a significant chance of a reversible cause and that he may get better, however he would not want to be intubated if it is unlikely to be reversible.    I strongly suspect that if intubated, this terminally ill patient likely may not be able to come off the ventilator.  At this time, given that he is maintaining an O2 saturation in the 90s on nonrebreather and has not yet received any antibiotics or other interventions, and based on discussion with wife, we will continue O2 by nonrebreather rather than intubating.  If he deteriorates further we will proceed with intubation.  ----------------------------------------- 11:12 AM on Jan 14, 2020 -----------------------------------------  Patient's heart rate and blood pressure are improving. O2 saturation remains in the 90s  and respiratory rate in the high 30s. We switched the patient to high flow nasal cannula for better comfort  and he is maintaining good oxygenation on this. At this time based on further discussion with the wife we will continue to hold off on intubation. Chest x-ray shows bibasilar infiltrates. Lab work-up is consistent with sepsis with elevated WBC and lactate.  Given the patient's tenuous respiratory status and sepsis, I discussed his case with Dr. Mortimer Fries from the ICU for admission.  ____________________________  Janit Bern was evaluated in Emergency Department on 27-Jan-2020 for the symptoms described in the history of present illness. He was evaluated in the context of the global COVID-19 pandemic, which necessitated consideration that the patient might be at risk for infection with the SARS-CoV-2 virus that causes COVID-19. Institutional protocols and algorithms that pertain to the evaluation of patients at risk for COVID-19 are in a state of rapid change based on information released by regulatory bodies including the CDC and federal and state organizations. These policies and algorithms were followed during the patient's care in the ED.  ____________________________________________   FINAL CLINICAL IMPRESSION(S) / ED DIAGNOSES  Final diagnoses:  Acute respiratory failure with hypoxia (HCC)  Aspiration pneumonia, unspecified aspiration pneumonia type, unspecified laterality, unspecified part of lung (HCC)  Sepsis, due to unspecified organism, unspecified whether acute organ dysfunction present [facility])      NEW MEDICATIONS STARTED DURING THIS VISIT:  New Prescriptions   No medications on file     Note:  This document was prepared using Dragon voice recognition software and may include unintentional dictation errors.   Arta Silence, MD 01/27/2020 1115

## 2020-01-12 NOTE — Consult Note (Signed)
[place]  [phone] [phone]   Name: Jacob Henson Date: 02-04-2020 MRN: [id number]  DOB: 04/22/1960  Patient Care Team: Elby Beck, FNP as PCP - General (Nurse Practitioner) Clent Jacks, RN as Oncology Nurse Navigator Sindy Guadeloupe, MD as Consulting Physician (Oncology) Jason Coop, NP as Nurse Practitioner    REASON FOR CONSULTATION: Jacob Henson is a 59 y.o. 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 Patient was treated for AKI due to severe dehydration. He was eventually discharged to rehab, where family report that he has continued to decline. Patient is readmitted 02-04-2020 with acute hypoxic respiratory failure and sepsis presumably from aspiration pneumonia. Palliative care was consulted over address goals and manage ongoing symptoms.  SOCIAL HISTORY:     reports that he quit smoking about 41 years ago. His smoking use included cigarettes. He has a 4.00 pack-year smoking history. He has never used smokeless tobacco. He reports that he does not drink alcohol and does not use drugs.  Patient is married and lives at home with his wife two dogs.  He has no children.  He formally worked in Teacher, adult education.  ADVANCE DIRECTIVES:  Does not have  CODE STATUS: DNR/DNI  PAST MEDICAL HISTORY: Past Medical History:  Diagnosis Date  . Allergy   . Cancer ([place])   . Esophageal cancer ([place])   . GERD (gastroesophageal reflux disease)    Has required esophageal dilation in past  . Hypertension     PAST SURGICAL HISTORY:  Past Surgical History:  Procedure Laterality Date  . ESOPHAGEAL DILATION    . PEG TUBE PLACEMENT  11/30/2019  . PORTA CATH INSERTION N/A  11/19/2019   Procedure: PORTA CATH INSERTION;  Surgeon: Algernon Huxley, MD;  Location: [facility];  Service: Cardiovascular;  Laterality: N/A;    HEMATOLOGY/ONCOLOGY HISTORY:  Oncology History  Esophageal adenocarcinoma ([place])  11/15/2019 Initial Diagnosis   Esophageal adenocarcinoma ([place])   11/22/2019 -  Chemotherapy   The patient had dexamethasone (DECADRON) 4 MG tablet, 8 mg, Oral, Daily, 1 of 1 cycle, Start date: --, End date: -- palonosetron (ALOXI) injection 0.25 mg, 0.25 mg, Intravenous,  Once, 2 of 6 cycles Administration: 0.25 mg (11/22/2019), 0.25 mg (12/07/2019) pegfilgrastim-cbqv (UDENYCA) injection 6 mg, 6 mg, Subcutaneous, Once, 1 of 5 cycles Administration: 6 mg (12/09/2019) leucovorin 1,000 mg in dextrose 5 % 250 mL infusion, 383 mg/m2 = 1,044 mg, Intravenous,  Once, 2 of 6 cycles Administration: 1,000 mg (11/22/2019), 1,000 mg (12/07/2019) oxaliplatin (ELOXATIN) 170 mg in dextrose 5 % 500 mL chemo infusion, 65 mg/m2 = 170 mg (100 % of original dose 65 mg/m2), Intravenous,  Once, 2 of 6 cycles Dose modification: 65 mg/m2 (original dose 65 mg/m2, Cycle 1, Reason: Other (see comments), Comment: Minimize toxicity in palliative setting) Administration: 170 mg (11/22/2019), 170 mg (12/07/2019) fluorouracil (ADRUCIL) chemo injection 1,000 mg, 383 mg/m2 = 1,050 mg, Intravenous,  Once, 2 of 6 cycles Administration: 1,000 mg (11/22/2019), 1,000 mg (12/07/2019) pembrolizumab (KEYTRUDA) 400 mg in sodium chloride 0.9 % 50 mL chemo infusion, 400 mg (100 % of original dose 400 mg), Intravenous, Once, 1 of 2 cycles Dose modification: 400 mg (original dose 400 mg, Cycle 2) Administration: 400 mg (12/09/2019) fluorouracil (ADRUCIL) 6,250 mg in sodium chloride 0.9 %  125 mL chemo infusion, 2,400 mg/m2 = 6,250 mg, Intravenous, 1 Day/Dose, 2 of 6 cycles Administration: 6,250 mg (11/22/2019), 6,250 mg (12/07/2019)  for chemotherapy treatment.      ALLERGIES:  has No Known  Allergies.  MEDICATIONS:  Current Facility-Administered Medications  Medication Dose Route Frequency Provider Last Rate Last Admin  . 0.9 %  sodium chloride infusion  250 mL Intravenous PRN Flora Lipps, MD      . acetaminophen (TYLENOL) suppository 650 mg  650 mg Rectal Once Arta Silence, MD      . acetaminophen (TYLENOL) tablet 650 mg  650 mg Oral Q4H PRN Flora Lipps, MD      . docusate sodium (COLACE) capsule 100 mg  100 mg Oral BID PRN Flora Lipps, MD      . enoxaparin (LOVENOX) injection 60 mg  0.5 mg/kg Subcutaneous Q24H Mortimer Fries, Kurian, MD      . famotidine (PEPCID) IVPB 20 mg premix  20 mg Intravenous Q12H Flora Lipps, MD      . lactated ringers infusion   Intravenous Continuous Arta Silence, MD 150 mL/hr at January 25, 2020 1204 New Bag at 01/25/2020 1204  . norepinephrine (LEVOPHED) 44m in 2579mpremix infusion  0-40 mcg/min Intravenous Continuous SiArta SilenceMD 7.5 mL/hr at [id number] 2 mcg/min at 1112-14-21209  . ondansetron (ZOFRAN) injection 4 mg  4 mg Intravenous Q6H PRN KaFlora LippsMD      . polyethylene glycol (MIRALAX / GLYCOLAX) packet 17 g  17 g Oral Daily PRN KaFlora LippsMD      . sodium chloride flush (NS) 0.9 % injection 3 mL  3 mL Intravenous Q12H KaFlora LippsMD   3 mL at 1112-14-21225  . sodium chloride flush (NS) 0.9 % injection 3 mL  3 mL Intravenous PRN KaFlora LippsMD      . vancomycin (VANCOCIN) IVPB 1000 mg/200 mL premix  1,000 mg Intravenous Once BeLorna DibbleRPH 200 mL/hr at 11Dec 14, 2021222 1,000 mg at 1114-Dec-2021222   Current Outpatient Medications  Medication Sig Dispense Refill  . acetaminophen (TYLENOL) 325 MG tablet Take 2 tablets (650 mg total) by mouth every 6 (six) hours as needed for mild pain (or Fever >/= 101). (Patient taking differently: Place 650 mg into feeding tube every 6 (six) hours as needed for mild pain (or Fever >/= 101). )    . fentaNYL (DURAGESIC) 12 MCG/HR Place 1 patch onto the skin every 3 (three) days. 5 patch  0  . lidocaine-prilocaine (EMLA) cream Apply to affected area once (Patient taking differently: Apply 1 application topically once as needed (port access). Apply to port 1 hour prior to chemo tx) 30 g 3  . magic mouthwash SOLN Take 10 mLs by mouth every 6 (six) hours. Swish and spit    . magnesium hydroxide (MILK OF MAGNESIA) 400 MG/5ML suspension Take 30 mLs by mouth daily as needed for mild constipation. (Patient taking differently: Place 30 mLs into feeding tube daily as needed for mild constipation. ) 355 mL 0  . metoCLOPramide (REGLAN) 5 MG/5ML solution Take 10 mLs (10 mg total) by mouth 3 (three) times daily before meals. (Patient taking differently: Place 10 mg into feeding tube 3 (three) times daily before meals. ) 120 mL 0  . metoprolol tartrate (LOPRESSOR) 25 MG tablet Take 0.5 tablets (12.5 mg total) by mouth daily. (Patient taking differently: Place 12.5 mg into feeding tube daily. ) 30 tablet 0  . Nutritional Supplements (FEEDING SUPPLEMENT, PROSOURCE TF,) liquid Place  45 mLs into feeding tube 4 (four) times daily. 1000 mL 0  . OLANZapine (ZYPREXA) 10 MG tablet Take 1 tablet (10 mg total) by mouth at bedtime. (Patient taking differently: Place 10 mg into feeding tube at bedtime. ) 30 tablet 3  . ondansetron (ZOFRAN-ODT) 8 MG disintegrating tablet Take 1 tablet (8 mg total) by mouth every 8 (eight) hours as needed for nausea or vomiting. (Patient taking differently: Place 8 mg into feeding tube every 8 (eight) hours as needed for nausea or vomiting. ) 30 tablet 1  . oxyCODONE (ROXICODONE) 5 MG/5ML solution Take 5 mLs (5 mg total) by mouth every 4 (four) hours as needed for severe pain. (Patient taking differently: Place 5 mg into feeding tube every 4 (four) hours as needed for severe pain. ) 180 mL 0  . pantoprazole sodium (PROTONIX) 40 mg/20 mL PACK Place 40 mg into feeding tube 2 (two) times daily.     . sennosides (SENOKOT) 8.8 MG/5ML syrup Take 10 mLs by mouth at bedtime. Through peg tube,  and hold if diarrhea. (Patient taking differently: Place 10 mLs into feeding tube at bedtime. hold if diarrhea.) 237 mL 3  . sucralfate (CARAFATE) 1 GM/10ML suspension Take 1 g by mouth 4 (four) times daily as needed (stomach pain).     . traZODone (DESYREL) 50 MG tablet Take 0.5 tablets (25 mg total) by mouth at bedtime as needed for sleep. (Patient taking differently: Place 25 mg into feeding tube at bedtime as needed for sleep. ) 30 tablet 0  . zinc oxide (BALMEX) 11.3 % CREA cream Apply 1 application topically as directed. Apply to sacrum TID and after each brief change    . calcium carbonate (TUMS - DOSED IN MG ELEMENTAL CALCIUM) 500 MG chewable tablet Chew 1 tablet (200 mg of elemental calcium total) by mouth 2 (two) times daily as needed for indigestion or heartburn. (Patient not taking: Reported on 01/21/20) 30 tablet 0    VITAL SIGNS: BP (!) 90/57   Pulse (!) 131   Temp (!) 101.9 F (38.8 C)   Resp (!) 35   Wt 262 lb (118.8 kg)   SpO2 100%   BMI 31.07 kg/m  Filed Weights   01-21-2020 0841  Weight: 262 lb (118.8 kg)    Estimated body mass index is 31.07 kg/m as calculated from the following:   Height as of 12/19/19: _0  (1.956 m).   Weight as of this encounter: 262 lb (118.8 kg).  LABS: CBC:    Component Value Date/Time   WBC 8.7 Jan 21, 2020 1144   HGB 9.1 (L) 01/21/2020 1144   HCT 30.2 (L) Jan 21, 2020 1144   PLT 192 Jan 21, 2020 1144   MCV 96.8 01/21/2020 1144   NEUTROABS 9.2 (H) 01-21-2020 0846   LYMPHSABS 1.5 Jan 21, 2020 0846   MONOABS 1.5 (H) January 21, 2020 0846   EOSABS 0.1 01/21/2020 0846   BASOSABS 0.1 Jan 21, 2020 0846   Comprehensive Metabolic Panel:    Component Value Date/Time   NA 138 2020-01-21 0846   NA 134 (A) 10/20/2019 0000   K 6.1 (H) 01-21-20 0846   CL 94 (L) 01-21-2020 0846   CO2 30 01/21/20 0846   BUN 72 (H) 01-21-2020 0846   BUN 29 (A) 10/20/2019 0000   CREATININE 3.06 (H) 01-21-2020 1144   GLUCOSE 101 (H) 01/21/2020 0846   CALCIUM 12.7 (H)  January 21, 2020 0846   AST 66 (H) 2020/01/21 0846   ALT 45 (H) 2020-01-21 0846   ALKPHOS 90 01-21-2020 0846  BILITOT 1.0 23-Jan-2020 0846   PROT 7.3 01-23-20 0846   ALBUMIN 2.5 (L) 01-23-2020 0846    RADIOGRAPHIC STUDIES: MR BRAIN W WO CONTRAST  Result Date: 12/20/2019 CLINICAL DATA:  Initial evaluation for intractable nausea, history of stage IV esophageal cancer. Evaluate for metastatic disease. EXAM: MRI HEAD WITHOUT AND WITH CONTRAST TECHNIQUE: Multiplanar, multiecho pulse sequences of the brain and surrounding structures were obtained without and with intravenous contrast. CONTRAST:  69m GADAVIST GADOBUTROL 1 MMOL/ML IV SOLN COMPARISON:  None available. FINDINGS: Brain: Cerebral volume within normal limits for age. Few scattered foci of subcentimeter T2/FLAIR hyperintensity noted within the periventricular deep white matter both cerebral hemispheres, nonspecific, but most like related chronic microvascular ischemic disease, mild for age. Superimposed remote lacunar infarcts noted within the left thalamus and basal ganglia. No abnormal foci of restricted diffusion to suggest acute or subacute ischemia. Gray-white matter differentiation maintained. No encephalomalacia to suggest chronic cortical infarction elsewhere within the brain. No acute intracranial hemorrhage. Few punctate chronic micro hemorrhages noted within the cerebellum, brainstem, and about the deep gray nuclei, most likely related to chronic underlying hypertension. No mass lesion, midline shift or mass effect. No abnormal enhancement or evidence for intracranial metastatic disease. Ventricles normal size without hydrocephalus. No extra-axial fluid collection. Pituitary gland suprasellar region within normal limits. Midline structures intact. Vascular: Major intracranial vascular flow voids are maintained. Skull and upper cervical spine: Craniocervical junction within normal limits. Bone marrow signal intensity normal. No visible focal  marrow replacing lesion. No scalp soft tissue abnormality. Sinuses/Orbits: Globes orbital soft tissues within normal limits. Paranasal sinuses are largely clear. No mastoid effusion. Inner ear structures within normal limits. Other: None. IMPRESSION: 1. No acute intracranial abnormality or evidence for intracranial metastatic disease. 2. Mild chronic microvascular ischemic disease with superimposed remote lacunar infarcts involving the left thalamus and basal ganglia. 3. Few punctate chronic micro hemorrhages involving the cerebellum, brainstem, and deep gray nuclei, most likely related to chronic underlying hypertension. Electronically Signed   By: BJeannine BogaM.D.   On: 12/20/2019 22:55   DG Chest Port 1 View  Result Date: 112/12/2021CLINICAL DATA:  Sepsis. EXAM: PORTABLE CHEST 1 VIEW COMPARISON:  December 19, 2019. FINDINGS: Stable cardiomediastinal silhouette. Mild bibasilar subsegmental atelectasis or infiltrates are noted. Right internal jugular Port-A-Cath is unchanged. Bony thorax is unremarkable. IMPRESSION: Mild bibasilar subsegmental atelectasis or infiltrates are noted. Electronically Signed   By: JMarijo ConceptionM.D.   On: 112-01-2108:37   DG Chest Portable 1 View  Result Date: 12/19/2019 CLINICAL DATA:  Weakness. EXAM: PORTABLE CHEST 1 VIEW COMPARISON:  October 10, 2018.  PET-CT dated 11/23/2019 FINDINGS: There is a well-positioned right-sided Port-A-Cath. Again noted is a lytic lesion involving the right glenoid. There is no pneumothorax. No large pleural effusion. There is some atelectasis at the lung bases. There is a lytic lesion involving the posterior eighth rib on the right. The heart size is unremarkable. IMPRESSION: 1. No acute cardiopulmonary process. 2. Well-positioned right-sided Port-A-Cath. 3. Lytic lesion involving the right glenoid and right eighth rib. Electronically Signed   By: CConstance HolsterM.D.   On: 12/19/2019 02:37   DG Abd 2 Views  Result Date:  12/19/2019 CLINICAL DATA:  Generalized weakness EXAM: ABDOMEN - 2 VIEW COMPARISON:  None. FINDINGS: The bowel gas pattern is normal. Moderate amount of right colonic stool is present. There is no evidence of free air. No radio-opaque calculi or other significant radiographic abnormality is seen. IMPRESSION: Nonobstructive bowel gas pattern. Electronically Signed  By: Prudencio Pair M.D.   On: 12/19/2019 16:30   DG HIP UNILAT WITH PELVIS 2-3 VIEWS LEFT  Result Date: 12/23/2019 CLINICAL DATA:  Bilateral hip pain, esophageal cancer with osseous metastatic disease EXAM: DG HIP (WITH OR WITHOUT PELVIS) 2-3V LEFT; DG HIP (WITH OR WITHOUT PELVIS) 2-3V RIGHT COMPARISON:  PET-CT, 11/23/2019 FINDINGS: No fracture or dislocation of the bilateral hips. There is a large, lytic lesion of the left ilium, better characterized by prior PET-CT. There are no lesions of the bilateral proximal femurs appreciated radiographically. Mild superior joint space height loss and acetabular osteophytosis of the bilateral hips. Enthesopathic change about the iliac crests. IMPRESSION: 1. No fracture or dislocation of the bilateral hips. Mild arthrosis. 2. There is a large, lytic lesion of the left ilium, better characterized by prior PET-CT. There are no lesions of the bilateral proximal femurs appreciated radiographically. Contrast enhanced MRI is the test of choice to assess for osseous metastatic lesions. Electronically Signed   By: Eddie Candle M.D.   On: 12/23/2019 18:57   DG HIP UNILAT WITH PELVIS 2-3 VIEWS RIGHT  Result Date: 12/23/2019 CLINICAL DATA:  Bilateral hip pain, esophageal cancer with osseous metastatic disease EXAM: DG HIP (WITH OR WITHOUT PELVIS) 2-3V LEFT; DG HIP (WITH OR WITHOUT PELVIS) 2-3V RIGHT COMPARISON:  PET-CT, 11/23/2019 FINDINGS: No fracture or dislocation of the bilateral hips. There is a large, lytic lesion of the left ilium, better characterized by prior PET-CT. There are no lesions of the bilateral  proximal femurs appreciated radiographically. Mild superior joint space height loss and acetabular osteophytosis of the bilateral hips. Enthesopathic change about the iliac crests. IMPRESSION: 1. No fracture or dislocation of the bilateral hips. Mild arthrosis. 2. There is a large, lytic lesion of the left ilium, better characterized by prior PET-CT. There are no lesions of the bilateral proximal femurs appreciated radiographically. Contrast enhanced MRI is the test of choice to assess for osseous metastatic lesions. Electronically Signed   By: Eddie Candle M.D.   On: 12/23/2019 18:57    PERFORMANCE STATUS (ECOG) : 3 - Symptomatic, >50% confined to bed  Review of Systems Unable to provide  Physical Exam General: Critically ill-appearing Pulmonary: Labored on high flow Extremities: no edema, no joint deformities Skin: no rashes Neurological: Nonresponsive  IMPRESSION: Patient is familiar to me from his recent hospitalization.  He is currently critically ill-appearing in ER.  He was started on high flow oxygen at 60 L / 100% FiO2.  He is currently hypotensive and has been started on Levophed.  Patient also noted to have acute renal failure.  I met with patient's wife.  She says that patient has been declining since he discharged from the hospital.  He was able to stand once with physical therapy but has otherwise been bedbound or requiring a lift to transport.  He now has a pressure ulcer.  Patient has been progressively confused.  She says that at one point during the week, patient told her that he is tired and ready to give up.  However, yesterday he told her that he wanted to keep fighting.    Wife says that she has told family that she does not expect the patient will survive this hospitalization.  However, she would like to continue the current scope of treatment for a few days to see how he does with hope that he might improve.  We discussed CODE STATUS in detail and I reviewed patient's  living will.  Wife verbalized clearly and repeatedly that  patient would not want to be resuscitated nor have his life prolonged artificially on machines.  She is in agreement with DNR/DNI.  We did discuss the option of comfort care in the event that patient declines or fails to improve over the next couple of days.  Wife says that her primary goal is to ensure that patient does not suffer.  PLAN: -Continue current scope of treatment -DNR/DNI -Consider transition to comfort care in the event that patient declines or fails to improve over the next few days  Case and plan discussed with Drs. Janese Banks and Mortimer Fries    Time Total: 75 minutes  Visit consisted of counseling and education dealing with the complex and emotionally intense issues of symptom management and palliative care in the setting of serious and potentially life-threatening illness.Greater than 50%  of this time was spent counseling and coordinating care related to the above assessment and plan.  Signed by: Altha Harm, PhD, NP-C

## 2020-01-12 NOTE — Sepsis Progress Note (Signed)
Elink monitoring this Code Sepsis. 

## 2020-01-12 NOTE — Progress Notes (Signed)
[place] responded to OR from palliative care NP Josh to provide support to pt. and wife; when [facility] arrived at pt.'s rm in ED wife was not present --> had been instructed to wait in ICU waiting rm. for pt. to be transferred there.  CH met w/wife in ICU waiting rm.; learned that pt. had been declining ever since discharge to SNF rehab last week; wife shared frustration that SNF was understaffed and that pt. was receiving inconsistent care.  Wife received call this AM that pt. had been found unresponsive and was brought to [facility].  Wife shared that she knows pt. would not want aggressive measures if they could not cure him; she feels 'selfish' for wanting pt. to be here with her, but is clear in her intention to not allow pt. to suffer unneccessarily.  [employer] paged to another rm. just after wife brought back to pt.'s rm.  [place] will make evening chaplain aware of situation in case follow-up support is needed today.

## 2020-01-12 NOTE — ED Notes (Signed)
Pt presents to ED from [facility] with c/o of being unresponsive per staff at [facility]. Pt presents unresponsive to verbal and painful stimuli. Per EMS pt was getting a tube feed. Per wife pt has been declining since at this facility. Pt has a HX of lung CA and CKD. Wife states pt was minimally responsive yesterday. Pt presents on NRB at 15L/min with a 02 sat of 98% but is now staying in the mid to low 90';s for 02 sat. Pt presents hypertensive and tachypenic with concerns for aspiration sepsis. Pt is borderline febrile with a temp of 100.5. Pt presents with a gurgling sound. Per EMS RA O2 sat at facility was 50%.

## 2020-01-12 NOTE — Progress Notes (Signed)
PHARMACIST - PHYSICIAN COMMUNICATION  CONCERNING:  Enoxaparin (Lovenox) for DVT Prophylaxis    RECOMMENDATION: Patient was prescribed enoxaprin 40mg  q24 hours for VTE prophylaxis.   Filed Weights   01/27/2020 0841  Weight: 118.8 kg (262 lb)    Body mass index is 31.07 kg/m.  Estimated Creatinine Clearance: 36.2 mL/min (A) (by C-G formula based on SCr of 3.14 mg/dL (H)).   Based on [place] patient is candidate for enoxaparin 0.5mg /kg TBW SQ every 24 hours based on BMI being >30.  DESCRIPTION: Pharmacy has adjusted enoxaparin dose per [facility] policy.  Patient is now receiving enoxaparin 60 mg every 24 hours    Vira Blanco, PharmD Clinical Pharmacist  01-27-2020 11:27 AM

## 2020-01-12 NOTE — Progress Notes (Signed)
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 He was recently hospitalized from 12/19/19 to 12/27/19 with weakness and intractable nausea/vomiting. Patient was treated for an AKI due to severe dehydration and was eventually discharged to [facility], where family report that he continued to rapidly decline. On 2020-01-21, he was found unresponsive at the rehab facility and EMS reports that his oxygen saturation was in the 50s on room air when they arrived. Patient's wife reports that he had been getting progressively weaker and more confused over the last few days, but that he did not have any prior episodes of respiratory distress. Patient has been receiving tube feeds, so there is suspicion for aspiration.  ED Course: Patient presented to the ED on 2020/01/21 with acute onset respiratory distress and hypoxia. He was somnolent and minimally responsive on exam. O2 saturation was in the low 90s on 15L by NRB. Patient is hypotensive, tachycardic, and febrile with increased work of breathing and coarse rhonchorus breath sounds bilaterally. Initiated fluids and antibiotics (vancomycin + cefepime) per sepsis protocol. Lactic acid 4.4. WBC 12.4. Troponins 169 > 514. BUN 72. Creatinine 3.14. CXR shows bibasilar infiltrates. Workup consistent with sepsis, likely from a pulmonary source. Patient was switched to HF [place] at 60 L / 100% FiO2 which seems more comfortable and he is maintaining good oxygenation. Levophed started for hypotension. Given the patient's tenuous respiratory status and sepsis, he was transferred to ICU for admission.   Palliative care was consulted and met with the patient's wife to discuss goals of care. Wife states that she would like to continue current scope of treatment for a few  days, but verbalized clearly and repeatedly that patient would not want to be resuscitated nor kept alive by machines. She is in agreement with changing code status to DNR/DNI. Primary goal is to ensure patient does not suffer.   CC  Respiratory failure  SUBJECTIVE Patient is critically ill with multi-organ failure Prognosis is guarded, patient will likely not survive this hospitalization High risk for cardiac arrest and death   BP 92/67 (BP Location: Right Arm)   Pulse (!) 127   Temp (!) 101.1 F (38.4 C) (Axillary)   Resp (!) 48   Ht _0  (1.956 m)   Wt 111.3 kg   SpO2 (!) 89%   BMI 29.10 kg/m    No intake/output data recorded. Total I/O In: 342.2 [I.V.:341.6; IV Piggyback:0.6] Out: -   SpO2: (!) 89 % O2 Flow Rate (L/min): 60 L/min FiO2 (%): 100 %  Estimated body mass index is 29.1 kg/m as calculated from the following:   Height as of this encounter: _1  (1.956 m).   Weight as of this encounter: 111.3 kg.   REVIEW OF SYSTEMS  PATIENT IS UNABLE TO PROVIDE COMPLETE REVIEW OF SYSTEMS DUE TO SEVERE CRITICAL ILLNESS      PHYSICAL EXAMINATION:  GENERAL: Critically ill appearing, +resp distress HEAD: Normocephalic, atraumatic.  EYES: Pupils equal, round, reactive to light. No scleral icterus.  MOUTH: Dry mucosal membrane. NECK: Supple, no JVD. PULMONARY: Tachypneic, labored on HFNC, coarse breath sounds, bilateral rhonchi CARDIOVASCULAR: S1 and S2. Tachycardic. Regular rhythm. No murmurs, rubs, or gallops.  GASTROINTESTINAL: Soft, nontender, non-distended. Positive bowel sounds.   MUSCULOSKELETAL: No swelling, clubbing, or edema.  NEUROLOGIC: Non-responsive SKIN: Intact,warm,dry  MEDICATIONS: I  have reviewed all medications and confirmed regimen as documented Scheduled Meds: . acetaminophen (TYLENOL) oral liquid 160 mg/5 mL  650 mg Per Tube Once  . acetaminophen  650 mg Rectal Once  . enoxaparin (LOVENOX) injection  0.5 mg/kg Subcutaneous Q24H  . sodium  chloride flush  3 mL Intravenous Q12H   Continuous Infusions: . sodium chloride    . ampicillin-sulbactam (UNASYN) IV 3 g (2020/02/01 1525)  . famotidine (PEPCID) IV    . lactated ringers 150 mL/hr at 02-01-2020 1414  . norepinephrine (LEVOPHED) Adult infusion 3 mcg/min (Feb 01, 2020 1414)   PRN Meds:.sodium chloride, acetaminophen, docusate sodium, morphine injection, ondansetron (ZOFRAN) IV, polyethylene glycol, sodium chloride flush   CULTURE RESULTS   Recent Results (from the past 240 hour(s))  SARS Coronavirus 2 by RT PCR (hospital order, performed in [facility] hospital lab) Nasopharyngeal Nasopharyngeal Swab     Status: None   Collection Time: 12/27/19 11:54 AM   Specimen: Nasopharyngeal Swab  Result Value Ref Range Status   SARS Coronavirus 2 NEGATIVE NEGATIVE Final    Comment: (NOTE) SARS-CoV-2 target nucleic acids are NOT DETECTED.  The SARS-CoV-2 RNA is generally detectable in upper and lower respiratory specimens during the acute phase of infection. The lowest concentration of SARS-CoV-2 viral copies this assay can detect is 250 copies / mL. A negative result does not preclude SARS-CoV-2 infection and should not be used as the sole basis for treatment or other patient management decisions.  A negative result may occur with improper specimen collection / handling, submission of specimen other than nasopharyngeal swab, presence of viral mutation(s) within the areas targeted by this assay, and inadequate number of viral copies (<250 copies / mL). A negative result must be combined with clinical observations, patient history, and epidemiological information.  Fact Sheet for Patients:   StrictlyIdeas.no  Fact Sheet for Healthcare Providers: BankingDealers.co.za  This test is not yet approved or  cleared by the [place] FDA and has been authorized for detection and/or diagnosis of SARS-CoV-2 by FDA under an Emergency Use  Authorization (EUA).  This EUA will remain in effect (meaning this test can be used) for the duration of the COVID-19 declaration under Section 564(b)(1) of the Act, 21 U.S.C. section 360bbb-3(b)(1), unless the authorization is terminated or revoked sooner.  Performed at [facility], [address] [place], [place] [address]   Resp Panel by RT-PCR (Flu A&B, Covid) Nasopharyngeal Swab     Status: None   Collection Time: Feb 01, 2020  8:46 AM   Specimen: Nasopharyngeal Swab; Nasopharyngeal(NP) swabs in vial transport medium  Result Value Ref Range Status   SARS Coronavirus 2 by RT PCR NEGATIVE NEGATIVE Final    Comment: (NOTE) SARS-CoV-2 target nucleic acids are NOT DETECTED.  The SARS-CoV-2 RNA is generally detectable in upper respiratory specimens during the acute phase of infection. The lowest concentration of SARS-CoV-2 viral copies this assay can detect is 138 copies/mL. A negative result does not preclude SARS-Cov-2 infection and should not be used as the sole basis for treatment or other patient management decisions. A negative result may occur with  improper specimen collection/handling, submission of specimen other than nasopharyngeal swab, presence of viral mutation(s) within the areas targeted by this assay, and inadequate number of viral copies(<138 copies/mL). A negative result must be combined with clinical observations, patient history, and epidemiological information. The expected result is Negative.  Fact Sheet for Patients:  EntrepreneurPulse.com.au  Fact Sheet for Healthcare Providers:  IncredibleEmployment.be  This test is no t yet  approved or cleared by the [place] and  has been authorized for detection and/or diagnosis of SARS-CoV-2 by FDA under an Emergency Use Authorization (EUA). This EUA will remain  in effect (meaning this test can be used) for the duration of the COVID-19 declaration under Section  564(b)(1) of the Act, 21 U.S.C.section 360bbb-3(b)(1), unless the authorization is terminated  or revoked sooner.       Influenza A by PCR NEGATIVE NEGATIVE Final   Influenza B by PCR NEGATIVE NEGATIVE Final    Comment: (NOTE) The Xpert Xpress SARS-CoV-2/FLU/RSV plus assay is intended as an aid in the diagnosis of influenza from Nasopharyngeal swab specimens and should not be used as a sole basis for treatment. Nasal washings and aspirates are unacceptable for Xpert Xpress SARS-CoV-2/FLU/RSV testing.  Fact Sheet for Patients: EntrepreneurPulse.com.au  Fact Sheet for Healthcare Providers: IncredibleEmployment.be  This test is not yet approved or cleared by the [place] FDA and has been authorized for detection and/or diagnosis of SARS-CoV-2 by FDA under an Emergency Use Authorization (EUA). This EUA will remain in effect (meaning this test can be used) for the duration of the COVID-19 declaration under Section 564(b)(1) of the Act, 21 U.S.C. section 360bbb-3(b)(1), unless the authorization is terminated or revoked.  Performed at [facility], [address] [place], [place] [address]    LABS   CBC Latest Ref Rng & Units 01/15/2020 01/15/20 12/27/2019  WBC 4.0 - 10.5 K/uL 8.7 12.4(H) 10.4  Hemoglobin 13.0 - 17.0 g/dL 9.1(L) 11.3(L) 10.8(L)  Hematocrit 39 - 52 % 30.2(L) 37.4(L) 33.5(L)  Platelets 150 - 400 K/uL 192 287 194   BMP Latest Ref Rng & Units 01-15-20 January 15, 2020 01/15/20  Glucose 70 - 99 mg/dL 104(H) - 101(H)  BUN 6 - 20 mg/dL 74(H) - 72(H)  Creatinine 0.61 - 1.24 mg/dL 2.85(H) 3.06(H) 3.14(H)  Sodium 135 - 145 mmol/L 137 - 138  Potassium 3.5 - 5.1 mmol/L 5.2(H) - 6.1(H)  Chloride 98 - 111 mmol/L 97(L) - 94(L)  CO2 22 - 32 mmol/L 29 - 30  Calcium 8.9 - 10.3 mg/dL 11.4(H) - 12.7(H)     IMAGING    DG Chest Port 1 View  Result Date: 2020-01-15 CLINICAL DATA:  Sepsis. EXAM: PORTABLE CHEST 1 VIEW  COMPARISON:  December 19, 2019. FINDINGS: Stable cardiomediastinal silhouette. Mild bibasilar subsegmental atelectasis or infiltrates are noted. Right internal jugular Port-A-Cath is unchanged. Bony thorax is unremarkable. IMPRESSION: Mild bibasilar subsegmental atelectasis or infiltrates are noted. Electronically Signed   By: Marijo Conception M.D.   On: Jan 15, 2020 09:37          Indwelling Urinary Catheter continued, requirement due to   Reason to continue Indwelling Urinary Catheter strict Intake/Output monitoring for hemodynamic instability          ASSESSMENT AND PLAN SYNOPSIS 58 yo male presents with acute respiratory failure, acute renal failure, aspiration pneumonia, and sepsis, complicated by Stage IV metastatic esophageal cancer s/p chemotherapy, currently on infusions of LR and Levophed. Wife made patient DNR/DNI but will continue current scope of treatment until family can visit. Then will transition to comfort care.  Severe ACUTE Hypoxic Respiratory Failure  - Likely secondary to aspiration pneumonia - Continue HF [place] to maintain SpO2 > 90% - Continue Unasyn - Morphine PRN for pain and SOB  ACUTE KIDNEY INJURY/Renal Failure - Continue Foley Catheter-assess need - Avoid nephrotoxic agents - Follow urine output, BMP - Ensure adequate renal perfusion, optimize oxygenation - Renal dose medications  SHOCK-SEPSIS  from ASPIRATION  PNEUMONIA - Use vasopressors to keep MAP>65 - Follow up cultures - Emperic ABX - Consider stress dose steroids - Aggressive IV fluid resuscitation  CARDIAC - ICU monitoring  DIET --> NPO GI PROPHYLAXIS as indicated Constipation protocol as indicated  ENDO - Will use ICU hypoglycemic\Hyperglycemia protocol if indicated  ELECTROLYTES - Follow labs as needed - Replace as needed - Pharmacy consultation and following   DVT/GI PRX ordered and assessed TRANSFUSIONS AS NEEDED MONITOR FSBS I Assessed the need for Labs I Assessed the need for  Foley I Assessed the need for Central Venous Line Family Discussion when available I Assessed the need for Mobilization I made an Assessment of medications to be adjusted accordingly Safety Risk assessment completed   CASE DISCUSSED IN MULTIDISCIPLINARY ROUNDS WITH ICU TEAM  Critical Care Time devoted to patient care services described in this note is 55 minutes.   Overall, patient is critically ill, prognosis is guarded.  Patient with Multiorgan failure and at high risk for cardiac arrest and death.   Patient is suffering and in dying process, will implement end of life policy for visitors  Corrin Parker, M.D.  [facility]  Medical Director [place] Director [facility] Cardio-Pulmonary Department

## 2020-01-12 NOTE — Consult Note (Signed)
CODE SEPSIS - PHARMACY COMMUNICATION  **Broad Spectrum Antibiotics should be administered within 1 hour of Sepsis diagnosis**  Time Code Sepsis Called/Page Received: 8:44a  Antibiotics Ordered: Cefepime & vancomycin  Time of 1st antibiotic administration: 8:53a  Additional action taken by pharmacy: n/a  If necessary, Name of Provider/Nurse Contacted: n/a    Lorna Dibble ,PharmD Clinical Pharmacist  01-22-20  8:51 AM

## 2020-01-12 NOTE — H&P (Signed)
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 He was recently hospitalized from 12/19/19 to 12/27/19 with weakness and intractable nausea/vomiting. Patient was treated for an AKI due to severe dehydration and was eventually discharged to [facility], where family report that he continued to rapidly decline. On 2020-01-21, he was found unresponsive at the rehab facility and EMS reports that his oxygen saturation was in the 50s on room air when they arrived. Patient's wife reports that he had been getting progressively weaker and more confused over the last few days, but that he did not have any prior episodes of respiratory distress. Patient has been receiving tube feeds, so there is suspicion for aspiration.  ED Course: Patient presented to the ED on 2020/01/21 with acute onset respiratory distress and hypoxia. He was somnolent and minimally responsive on exam. O2 saturation was in the low 90s on 15L by NRB. Patient is hypotensive, tachycardic, and febrile with increased work of breathing and coarse rhonchorus breath sounds bilaterally. Initiated fluids and antibiotics (vancomycin + cefepime) per sepsis protocol. Lactic acid 4.4. WBC 12.4. Troponins 169 > 514. BUN 72. Creatinine 3.14. CXR shows bibasilar infiltrates. Workup consistent with sepsis, likely from a pulmonary source. Patient was switched to HF [place] at 60 L / 100% FiO2 which seems more comfortable and he is maintaining good oxygenation. Levophed started for hypotension. Given the patient's tenuous respiratory status and sepsis, he was transferred to ICU for admission.   Palliative care was consulted and met with the patient's wife to discuss goals of care. Wife states that she would like to continue current scope of treatment for a few  days, but verbalized clearly and repeatedly that patient would not want to be resuscitated nor kept alive by machines. She is in agreement with changing code status to DNR/DNI. Primary goal is to ensure patient does not suffer.   CC  Respiratory failure  SUBJECTIVE Patient is critically ill with multi-organ failure Prognosis is guarded, patient will likely not survive this hospitalization High risk for cardiac arrest and death   BP 92/67 (BP Location: Right Arm)   Pulse (!) 127   Temp (!) 101.1 F (38.4 C) (Axillary)   Resp (!) 48   Ht _0  (1.956 m)   Wt 111.3 kg   SpO2 (!) 89%   BMI 29.10 kg/m    No intake/output data recorded. Total I/O In: 342.2 [I.V.:341.6; IV Piggyback:0.6] Out: -   SpO2: (!) 89 % O2 Flow Rate (L/min): 60 L/min FiO2 (%): 100 %  Estimated body mass index is 29.1 kg/m as calculated from the following:   Height as of this encounter: _1  (1.956 m).   Weight as of this encounter: 111.3 kg.   REVIEW OF SYSTEMS  PATIENT IS UNABLE TO PROVIDE COMPLETE REVIEW OF SYSTEMS DUE TO SEVERE CRITICAL ILLNESS      PHYSICAL EXAMINATION:  GENERAL: Critically ill appearing, +resp distress HEAD: Normocephalic, atraumatic.  EYES: Pupils equal, round, reactive to light. No scleral icterus.  MOUTH: Dry mucosal membrane. NECK: Supple, no JVD. PULMONARY: Tachypneic, labored on HFNC, coarse breath sounds, bilateral rhonchi CARDIOVASCULAR: S1 and S2. Tachycardic. Regular rhythm. No murmurs, rubs, or gallops.  GASTROINTESTINAL: Soft, nontender, non-distended. Positive bowel sounds.   MUSCULOSKELETAL: No swelling, clubbing, or edema.  NEUROLOGIC: Non-responsive SKIN: Intact,warm,dry  MEDICATIONS: I  have reviewed all medications and confirmed regimen as documented Scheduled Meds: . acetaminophen (TYLENOL) oral liquid 160 mg/5 mL  650 mg Per Tube Once  . acetaminophen  650 mg Rectal Once  . enoxaparin (LOVENOX) injection  0.5 mg/kg Subcutaneous Q24H  . sodium  chloride flush  3 mL Intravenous Q12H   Continuous Infusions: . sodium chloride    . ampicillin-sulbactam (UNASYN) IV 3 g (2020/02/01 1525)  . famotidine (PEPCID) IV    . lactated ringers 150 mL/hr at 02-01-2020 1414  . norepinephrine (LEVOPHED) Adult infusion 3 mcg/min (Feb 01, 2020 1414)   PRN Meds:.sodium chloride, acetaminophen, docusate sodium, morphine injection, ondansetron (ZOFRAN) IV, polyethylene glycol, sodium chloride flush   CULTURE RESULTS   Recent Results (from the past 240 hour(s))  SARS Coronavirus 2 by RT PCR (hospital order, performed in [facility] hospital lab) Nasopharyngeal Nasopharyngeal Swab     Status: None   Collection Time: 12/27/19 11:54 AM   Specimen: Nasopharyngeal Swab  Result Value Ref Range Status   SARS Coronavirus 2 NEGATIVE NEGATIVE Final    Comment: (NOTE) SARS-CoV-2 target nucleic acids are NOT DETECTED.  The SARS-CoV-2 RNA is generally detectable in upper and lower respiratory specimens during the acute phase of infection. The lowest concentration of SARS-CoV-2 viral copies this assay can detect is 250 copies / mL. A negative result does not preclude SARS-CoV-2 infection and should not be used as the sole basis for treatment or other patient management decisions.  A negative result may occur with improper specimen collection / handling, submission of specimen other than nasopharyngeal swab, presence of viral mutation(s) within the areas targeted by this assay, and inadequate number of viral copies (<250 copies / mL). A negative result must be combined with clinical observations, patient history, and epidemiological information.  Fact Sheet for Patients:   StrictlyIdeas.no  Fact Sheet for Healthcare Providers: BankingDealers.co.za  This test is not yet approved or  cleared by the [place] FDA and has been authorized for detection and/or diagnosis of SARS-CoV-2 by FDA under an Emergency Use  Authorization (EUA).  This EUA will remain in effect (meaning this test can be used) for the duration of the COVID-19 declaration under Section 564(b)(1) of the Act, 21 U.S.C. section 360bbb-3(b)(1), unless the authorization is terminated or revoked sooner.  Performed at [facility], [address] [place], [place] [address]   Resp Panel by RT-PCR (Flu A&B, Covid) Nasopharyngeal Swab     Status: None   Collection Time: Feb 01, 2020  8:46 AM   Specimen: Nasopharyngeal Swab; Nasopharyngeal(NP) swabs in vial transport medium  Result Value Ref Range Status   SARS Coronavirus 2 by RT PCR NEGATIVE NEGATIVE Final    Comment: (NOTE) SARS-CoV-2 target nucleic acids are NOT DETECTED.  The SARS-CoV-2 RNA is generally detectable in upper respiratory specimens during the acute phase of infection. The lowest concentration of SARS-CoV-2 viral copies this assay can detect is 138 copies/mL. A negative result does not preclude SARS-Cov-2 infection and should not be used as the sole basis for treatment or other patient management decisions. A negative result may occur with  improper specimen collection/handling, submission of specimen other than nasopharyngeal swab, presence of viral mutation(s) within the areas targeted by this assay, and inadequate number of viral copies(<138 copies/mL). A negative result must be combined with clinical observations, patient history, and epidemiological information. The expected result is Negative.  Fact Sheet for Patients:  EntrepreneurPulse.com.au  Fact Sheet for Healthcare Providers:  IncredibleEmployment.be  This test is no t yet  approved or cleared by the [place] and  has been authorized for detection and/or diagnosis of SARS-CoV-2 by FDA under an Emergency Use Authorization (EUA). This EUA will remain  in effect (meaning this test can be used) for the duration of the COVID-19 declaration under Section  564(b)(1) of the Act, 21 U.S.C.section 360bbb-3(b)(1), unless the authorization is terminated  or revoked sooner.       Influenza A by PCR NEGATIVE NEGATIVE Final   Influenza B by PCR NEGATIVE NEGATIVE Final    Comment: (NOTE) The Xpert Xpress SARS-CoV-2/FLU/RSV plus assay is intended as an aid in the diagnosis of influenza from Nasopharyngeal swab specimens and should not be used as a sole basis for treatment. Nasal washings and aspirates are unacceptable for Xpert Xpress SARS-CoV-2/FLU/RSV testing.  Fact Sheet for Patients: EntrepreneurPulse.com.au  Fact Sheet for Healthcare Providers: IncredibleEmployment.be  This test is not yet approved or cleared by the [place] FDA and has been authorized for detection and/or diagnosis of SARS-CoV-2 by FDA under an Emergency Use Authorization (EUA). This EUA will remain in effect (meaning this test can be used) for the duration of the COVID-19 declaration under Section 564(b)(1) of the Act, 21 U.S.C. section 360bbb-3(b)(1), unless the authorization is terminated or revoked.  Performed at [facility], [address] [place], [place] [address]    LABS   CBC Latest Ref Rng & Units 01/15/2020 01/15/20 12/27/2019  WBC 4.0 - 10.5 K/uL 8.7 12.4(H) 10.4  Hemoglobin 13.0 - 17.0 g/dL 9.1(L) 11.3(L) 10.8(L)  Hematocrit 39 - 52 % 30.2(L) 37.4(L) 33.5(L)  Platelets 150 - 400 K/uL 192 287 194   BMP Latest Ref Rng & Units 01-15-20 January 15, 2020 01/15/20  Glucose 70 - 99 mg/dL 104(H) - 101(H)  BUN 6 - 20 mg/dL 74(H) - 72(H)  Creatinine 0.61 - 1.24 mg/dL 2.85(H) 3.06(H) 3.14(H)  Sodium 135 - 145 mmol/L 137 - 138  Potassium 3.5 - 5.1 mmol/L 5.2(H) - 6.1(H)  Chloride 98 - 111 mmol/L 97(L) - 94(L)  CO2 22 - 32 mmol/L 29 - 30  Calcium 8.9 - 10.3 mg/dL 11.4(H) - 12.7(H)     IMAGING    DG Chest Port 1 View  Result Date: 2020-01-15 CLINICAL DATA:  Sepsis. EXAM: PORTABLE CHEST 1 VIEW  COMPARISON:  December 19, 2019. FINDINGS: Stable cardiomediastinal silhouette. Mild bibasilar subsegmental atelectasis or infiltrates are noted. Right internal jugular Port-A-Cath is unchanged. Bony thorax is unremarkable. IMPRESSION: Mild bibasilar subsegmental atelectasis or infiltrates are noted. Electronically Signed   By: Marijo Conception M.D.   On: Jan 15, 2020 09:37          Indwelling Urinary Catheter continued, requirement due to   Reason to continue Indwelling Urinary Catheter strict Intake/Output monitoring for hemodynamic instability          ASSESSMENT AND PLAN SYNOPSIS 58 yo male presents with acute respiratory failure, acute renal failure, aspiration pneumonia, and sepsis, complicated by Stage IV metastatic esophageal cancer s/p chemotherapy, currently on infusions of LR and Levophed. Wife made patient DNR/DNI but will continue current scope of treatment until family can visit. Then will transition to comfort care.  Severe ACUTE Hypoxic Respiratory Failure  - Likely secondary to aspiration pneumonia - Continue HF [place] to maintain SpO2 > 90% - Continue Unasyn - Morphine PRN for pain and SOB  ACUTE KIDNEY INJURY/Renal Failure - Continue Foley Catheter-assess need - Avoid nephrotoxic agents - Follow urine output, BMP - Ensure adequate renal perfusion, optimize oxygenation - Renal dose medications  SHOCK-SEPSIS  from ASPIRATION  PNEUMONIA - Use vasopressors to keep MAP>65 - Follow up cultures - Emperic ABX - Consider stress dose steroids - Aggressive IV fluid resuscitation  CARDIAC - ICU monitoring  DIET --> NPO GI PROPHYLAXIS as indicated Constipation protocol as indicated  ENDO - Will use ICU hypoglycemic\Hyperglycemia protocol if indicated  ELECTROLYTES - Follow labs as needed - Replace as needed - Pharmacy consultation and following   DVT/GI PRX ordered and assessed TRANSFUSIONS AS NEEDED MONITOR FSBS I Assessed the need for Labs I Assessed the need for  Foley I Assessed the need for Central Venous Line Family Discussion when available I Assessed the need for Mobilization I made an Assessment of medications to be adjusted accordingly Safety Risk assessment completed   CASE DISCUSSED IN MULTIDISCIPLINARY ROUNDS WITH ICU TEAM  Critical Care Time devoted to patient care services described in this note is 55 minutes.   Overall, patient is critically ill, prognosis is guarded.  Patient with Multiorgan failure and at high risk for cardiac arrest and death.   Patient is suffering and in dying process, will implement end of life policy for visitors  Corrin Parker, M.D.  [facility]  Medical Director [place] Director [facility] Cardio-Pulmonary Department

## 2020-01-12 NOTE — Progress Notes (Signed)
[facility] Liaison note:  Patient is currently followed by [facility] outpatient Palliative services at [facility]. TOC Ginnie Russoli made aware. Will follow for disposition. Thank you. Flo Shanks BSN, RN, [place] [phone]

## 2020-01-12 NOTE — Progress Notes (Signed)
Pts wife would like to transition pt to comfort care measures only.  Therefore, will place orders to focus on comfort.  Marda Stalker, [place] Pager [phone] (please enter 7 digits) PCCM Consult Pager [phone] (please enter 7 digits)

## 2020-01-12 NOTE — Progress Notes (Signed)
59 y/o M w/ PMH of end stage esophageal cancer who presented w/ respiratory failure. Currently on HFNC. Now DNR/DNI. Family possibly moving towards comfort care if pt fails to improve over the next couple of days as per palliative care. Hospitalist service will take over tomorrow.  This is a non-billable note.

## 2020-01-12 DEATH — deceased

## 2020-01-13 ENCOUNTER — Ambulatory Visit: Payer: 59

## 2020-01-13 ENCOUNTER — Inpatient Hospital Stay: Payer: 59

## 2020-01-14 ENCOUNTER — Ambulatory Visit: Payer: 59 | Admitting: Family Medicine

## 2020-01-14 ENCOUNTER — Ambulatory Visit: Payer: 59

## 2020-01-17 ENCOUNTER — Other Ambulatory Visit: Payer: 59

## 2020-01-17 ENCOUNTER — Ambulatory Visit: Payer: 59 | Admitting: Oncology

## 2020-01-17 ENCOUNTER — Ambulatory Visit: Payer: 59

## 2020-01-18 ENCOUNTER — Ambulatory Visit: Payer: 59

## 2020-01-19 ENCOUNTER — Ambulatory Visit: Payer: 59

## 2020-01-20 ENCOUNTER — Ambulatory Visit: Payer: 59

## 2020-01-21 ENCOUNTER — Ambulatory Visit: Payer: 59

## 2020-01-23 ENCOUNTER — Ambulatory Visit: Payer: 59

## 2020-01-24 ENCOUNTER — Ambulatory Visit: Payer: 59

## 2020-01-25 ENCOUNTER — Ambulatory Visit: Payer: 59
